# Patient Record
Sex: Female | Born: 1960 | Race: White | Hispanic: No | Marital: Single | Smoking: Never smoker
Health system: Southern US, Community
[De-identification: ages and names within clinical notes are randomized; demographics above are authoritative.]

## PROBLEM LIST (undated history)

## (undated) DIAGNOSIS — I350 Nonrheumatic aortic (valve) stenosis: Secondary | ICD-10-CM

## (undated) DIAGNOSIS — B029 Zoster without complications: Secondary | ICD-10-CM

## (undated) DIAGNOSIS — K589 Irritable bowel syndrome without diarrhea: Secondary | ICD-10-CM

## (undated) DIAGNOSIS — M8580 Other specified disorders of bone density and structure, unspecified site: Secondary | ICD-10-CM

## (undated) DIAGNOSIS — F419 Anxiety disorder, unspecified: Secondary | ICD-10-CM

## (undated) DIAGNOSIS — M940 Chondrocostal junction syndrome [Tietze]: Secondary | ICD-10-CM

## (undated) DIAGNOSIS — D649 Anemia, unspecified: Secondary | ICD-10-CM

## (undated) DIAGNOSIS — Z7901 Long term (current) use of anticoagulants: Secondary | ICD-10-CM

## (undated) DIAGNOSIS — N766 Ulceration of vulva: Secondary | ICD-10-CM

## (undated) DIAGNOSIS — R0602 Shortness of breath: Secondary | ICD-10-CM

## (undated) DIAGNOSIS — K219 Gastro-esophageal reflux disease without esophagitis: Secondary | ICD-10-CM

## (undated) DIAGNOSIS — R21 Rash and other nonspecific skin eruption: Secondary | ICD-10-CM

## (undated) DIAGNOSIS — Z952 Presence of prosthetic heart valve: Secondary | ICD-10-CM

## (undated) DIAGNOSIS — E785 Hyperlipidemia, unspecified: Secondary | ICD-10-CM

## (undated) DIAGNOSIS — K5790 Diverticulosis of intestine, part unspecified, without perforation or abscess without bleeding: Secondary | ICD-10-CM

## (undated) DIAGNOSIS — M810 Age-related osteoporosis without current pathological fracture: Secondary | ICD-10-CM

## (undated) DIAGNOSIS — N3281 Overactive bladder: Secondary | ICD-10-CM

## (undated) DIAGNOSIS — F32A Depression, unspecified: Secondary | ICD-10-CM

## (undated) DIAGNOSIS — K635 Polyp of colon: Secondary | ICD-10-CM

## (undated) DIAGNOSIS — R079 Chest pain, unspecified: Secondary | ICD-10-CM

## (undated) DIAGNOSIS — H409 Unspecified glaucoma: Secondary | ICD-10-CM

## (undated) DIAGNOSIS — G43909 Migraine, unspecified, not intractable, without status migrainosus: Secondary | ICD-10-CM

## (undated) HISTORY — DX: Diverticulosis of intestine, part unspecified, without perforation or abscess without bleeding: K57.90

## (undated) HISTORY — DX: Migraine, unspecified, not intractable, without status migrainosus: G43.909

## (undated) HISTORY — DX: Long term (current) use of anticoagulants: Z79.01

## (undated) HISTORY — DX: Age-related osteoporosis without current pathological fracture: M81.0

## (undated) HISTORY — DX: Unspecified glaucoma: H40.9

## (undated) HISTORY — DX: Anemia, unspecified: D64.9

## (undated) HISTORY — DX: Presence of prosthetic heart valve: Z95.2

## (undated) HISTORY — DX: Chest pain, unspecified: R07.9

## (undated) HISTORY — DX: Nonrheumatic aortic (valve) stenosis: I35.0

## (undated) HISTORY — PX: APPENDECTOMY: SHX54

## (undated) HISTORY — DX: Hyperlipidemia, unspecified: E78.5

## (undated) HISTORY — DX: Polyp of colon: K63.5

## (undated) HISTORY — DX: Other specified disorders of bone density and structure, unspecified site: M85.80

## (undated) HISTORY — PX: FINGER FRACTURE SURGERY: SHX638

## (undated) HISTORY — DX: Shortness of breath: R06.02

## (undated) HISTORY — DX: Depression, unspecified: F32.A

## (undated) HISTORY — DX: Ulceration of vulva: N76.6

## (undated) HISTORY — DX: Anxiety disorder, unspecified: F41.9

## (undated) HISTORY — DX: Gastro-esophageal reflux disease without esophagitis: K21.9

## (undated) HISTORY — DX: Overactive bladder: N32.81

## (undated) HISTORY — DX: Rash and other nonspecific skin eruption: R21

## (undated) HISTORY — DX: Irritable bowel syndrome, unspecified: K58.9

## (undated) HISTORY — DX: Zoster without complications: B02.9

## (undated) HISTORY — DX: Chondrocostal junction syndrome (tietze): M94.0

---

## 1998-11-10 ENCOUNTER — Encounter (INDEPENDENT_AMBULATORY_CARE_PROVIDER_SITE_OTHER): Payer: Self-pay | Admitting: Specialist

## 1998-11-10 ENCOUNTER — Other Ambulatory Visit: Admission: RE | Admit: 1998-11-10 | Discharge: 1998-11-10 | Payer: Self-pay | Admitting: Obstetrics and Gynecology

## 1999-05-18 HISTORY — PX: AORTIC VALVE REPLACEMENT: SHX41

## 1999-06-11 ENCOUNTER — Ambulatory Visit (HOSPITAL_COMMUNITY): Admission: RE | Admit: 1999-06-11 | Discharge: 1999-06-11 | Payer: Self-pay | Admitting: *Deleted

## 1999-08-06 ENCOUNTER — Encounter: Payer: Self-pay | Admitting: Cardiothoracic Surgery

## 1999-08-10 ENCOUNTER — Inpatient Hospital Stay (HOSPITAL_COMMUNITY): Admission: RE | Admit: 1999-08-10 | Discharge: 1999-08-15 | Payer: Self-pay | Admitting: Cardiothoracic Surgery

## 1999-08-10 ENCOUNTER — Encounter: Payer: Self-pay | Admitting: Cardiothoracic Surgery

## 1999-08-10 ENCOUNTER — Encounter (INDEPENDENT_AMBULATORY_CARE_PROVIDER_SITE_OTHER): Payer: Self-pay | Admitting: *Deleted

## 1999-08-11 ENCOUNTER — Encounter: Payer: Self-pay | Admitting: Cardiothoracic Surgery

## 1999-08-12 ENCOUNTER — Encounter: Payer: Self-pay | Admitting: Cardiothoracic Surgery

## 1999-09-08 ENCOUNTER — Encounter (HOSPITAL_COMMUNITY): Admission: RE | Admit: 1999-09-08 | Discharge: 1999-12-07 | Payer: Self-pay | Admitting: *Deleted

## 1999-12-08 ENCOUNTER — Encounter (HOSPITAL_COMMUNITY): Admission: RE | Admit: 1999-12-08 | Discharge: 2000-03-07 | Payer: Self-pay | Admitting: *Deleted

## 2000-02-16 ENCOUNTER — Encounter: Admission: RE | Admit: 2000-02-16 | Discharge: 2000-02-16 | Payer: Self-pay | Admitting: *Deleted

## 2000-02-16 ENCOUNTER — Encounter: Payer: Self-pay | Admitting: *Deleted

## 2000-02-22 ENCOUNTER — Emergency Department (HOSPITAL_COMMUNITY): Admission: EM | Admit: 2000-02-22 | Discharge: 2000-02-22 | Payer: Self-pay | Admitting: Emergency Medicine

## 2000-02-23 ENCOUNTER — Encounter: Payer: Self-pay | Admitting: Emergency Medicine

## 2000-03-21 ENCOUNTER — Encounter: Payer: Self-pay | Admitting: Obstetrics and Gynecology

## 2000-03-21 ENCOUNTER — Encounter: Admission: RE | Admit: 2000-03-21 | Discharge: 2000-03-21 | Payer: Self-pay | Admitting: Obstetrics and Gynecology

## 2001-02-09 ENCOUNTER — Encounter: Payer: Self-pay | Admitting: Family Medicine

## 2001-02-09 ENCOUNTER — Encounter: Admission: RE | Admit: 2001-02-09 | Discharge: 2001-02-09 | Payer: Self-pay | Admitting: Family Medicine

## 2001-07-17 ENCOUNTER — Other Ambulatory Visit: Admission: RE | Admit: 2001-07-17 | Discharge: 2001-07-17 | Payer: Self-pay | Admitting: Obstetrics and Gynecology

## 2002-07-19 ENCOUNTER — Other Ambulatory Visit: Admission: RE | Admit: 2002-07-19 | Discharge: 2002-07-19 | Payer: Self-pay | Admitting: Obstetrics and Gynecology

## 2003-01-17 ENCOUNTER — Encounter: Admission: RE | Admit: 2003-01-17 | Discharge: 2003-01-17 | Payer: Self-pay | Admitting: Family Medicine

## 2003-01-17 ENCOUNTER — Encounter: Payer: Self-pay | Admitting: Family Medicine

## 2003-08-06 ENCOUNTER — Other Ambulatory Visit: Admission: RE | Admit: 2003-08-06 | Discharge: 2003-08-06 | Payer: Self-pay | Admitting: Obstetrics and Gynecology

## 2004-03-17 ENCOUNTER — Ambulatory Visit: Payer: Self-pay

## 2004-04-21 ENCOUNTER — Ambulatory Visit: Payer: Self-pay | Admitting: *Deleted

## 2004-04-28 ENCOUNTER — Ambulatory Visit: Payer: Self-pay | Admitting: Cardiology

## 2004-05-08 ENCOUNTER — Ambulatory Visit: Payer: Self-pay | Admitting: Cardiology

## 2004-05-29 ENCOUNTER — Ambulatory Visit: Payer: Self-pay | Admitting: Cardiovascular Disease

## 2004-06-16 ENCOUNTER — Ambulatory Visit: Payer: Self-pay | Admitting: Internal Medicine

## 2004-07-01 ENCOUNTER — Ambulatory Visit: Payer: Self-pay | Admitting: Cardiology

## 2004-07-15 ENCOUNTER — Ambulatory Visit: Payer: Self-pay | Admitting: Cardiology

## 2004-07-30 ENCOUNTER — Ambulatory Visit: Payer: Self-pay | Admitting: Cardiology

## 2004-08-05 ENCOUNTER — Other Ambulatory Visit: Admission: RE | Admit: 2004-08-05 | Discharge: 2004-08-05 | Payer: Self-pay | Admitting: Obstetrics and Gynecology

## 2004-08-20 ENCOUNTER — Ambulatory Visit: Payer: Self-pay | Admitting: Internal Medicine

## 2004-09-09 ENCOUNTER — Ambulatory Visit: Payer: Self-pay | Admitting: Cardiology

## 2004-09-21 ENCOUNTER — Ambulatory Visit: Payer: Self-pay | Admitting: Cardiology

## 2004-10-01 ENCOUNTER — Ambulatory Visit: Payer: Self-pay | Admitting: Cardiology

## 2004-10-22 ENCOUNTER — Ambulatory Visit: Payer: Self-pay | Admitting: Cardiology

## 2004-11-05 ENCOUNTER — Ambulatory Visit: Payer: Self-pay | Admitting: Cardiology

## 2004-11-27 ENCOUNTER — Ambulatory Visit: Payer: Self-pay | Admitting: Cardiovascular Disease

## 2004-12-21 ENCOUNTER — Ambulatory Visit: Payer: Self-pay | Admitting: Cardiology

## 2005-01-04 ENCOUNTER — Ambulatory Visit: Payer: Self-pay | Admitting: Cardiology

## 2005-01-11 ENCOUNTER — Ambulatory Visit: Payer: Self-pay | Admitting: Cardiology

## 2005-01-25 ENCOUNTER — Ambulatory Visit: Payer: Self-pay | Admitting: Cardiology

## 2005-02-01 ENCOUNTER — Ambulatory Visit: Payer: Self-pay | Admitting: Cardiology

## 2005-02-16 ENCOUNTER — Ambulatory Visit: Payer: Self-pay | Admitting: *Deleted

## 2005-03-16 ENCOUNTER — Ambulatory Visit: Payer: Self-pay | Admitting: Cardiology

## 2005-03-30 ENCOUNTER — Encounter: Payer: Self-pay | Admitting: Emergency Medicine

## 2005-03-31 ENCOUNTER — Inpatient Hospital Stay (HOSPITAL_COMMUNITY): Admission: AD | Admit: 2005-03-31 | Discharge: 2005-04-04 | Payer: Self-pay | Admitting: Internal Medicine

## 2005-04-02 ENCOUNTER — Ambulatory Visit: Payer: Self-pay | Admitting: Internal Medicine

## 2005-04-05 ENCOUNTER — Ambulatory Visit: Payer: Self-pay | Admitting: Cardiology

## 2005-04-07 ENCOUNTER — Ambulatory Visit: Payer: Self-pay | Admitting: Cardiology

## 2005-04-07 ENCOUNTER — Ambulatory Visit: Payer: Self-pay | Admitting: Cardiovascular Disease

## 2005-04-14 ENCOUNTER — Ambulatory Visit: Payer: Self-pay | Admitting: Cardiology

## 2005-04-23 ENCOUNTER — Ambulatory Visit: Payer: Self-pay | Admitting: Cardiology

## 2005-05-06 ENCOUNTER — Ambulatory Visit: Payer: Self-pay | Admitting: Cardiology

## 2005-05-27 ENCOUNTER — Ambulatory Visit: Payer: Self-pay | Admitting: Cardiology

## 2005-06-21 ENCOUNTER — Ambulatory Visit: Payer: Self-pay | Admitting: Internal Medicine

## 2005-07-13 ENCOUNTER — Ambulatory Visit: Payer: Self-pay | Admitting: Cardiovascular Disease

## 2005-08-03 ENCOUNTER — Ambulatory Visit: Payer: Self-pay | Admitting: Internal Medicine

## 2005-08-12 ENCOUNTER — Other Ambulatory Visit: Admission: RE | Admit: 2005-08-12 | Discharge: 2005-08-12 | Payer: Self-pay | Admitting: Obstetrics & Gynecology

## 2005-08-19 ENCOUNTER — Ambulatory Visit: Payer: Self-pay | Admitting: Cardiology

## 2005-09-01 ENCOUNTER — Encounter: Admission: RE | Admit: 2005-09-01 | Discharge: 2005-09-01 | Payer: Self-pay | Admitting: Obstetrics & Gynecology

## 2005-09-09 ENCOUNTER — Ambulatory Visit: Payer: Self-pay | Admitting: *Deleted

## 2005-09-27 ENCOUNTER — Ambulatory Visit: Payer: Self-pay | Admitting: Cardiovascular Disease

## 2005-09-30 ENCOUNTER — Ambulatory Visit: Payer: Self-pay | Admitting: Cardiology

## 2005-10-28 ENCOUNTER — Ambulatory Visit: Payer: Self-pay | Admitting: Cardiology

## 2005-11-12 ENCOUNTER — Ambulatory Visit: Payer: Self-pay | Admitting: Cardiology

## 2005-11-16 ENCOUNTER — Inpatient Hospital Stay (HOSPITAL_COMMUNITY): Admission: EM | Admit: 2005-11-16 | Discharge: 2005-11-20 | Payer: Self-pay | Admitting: Emergency Medicine

## 2005-11-16 ENCOUNTER — Ambulatory Visit: Payer: Self-pay | Admitting: Cardiology

## 2005-11-17 ENCOUNTER — Encounter (INDEPENDENT_AMBULATORY_CARE_PROVIDER_SITE_OTHER): Payer: Self-pay | Admitting: Specialist

## 2005-11-24 ENCOUNTER — Ambulatory Visit: Payer: Self-pay | Admitting: Internal Medicine

## 2005-11-29 ENCOUNTER — Ambulatory Visit: Payer: Self-pay | Admitting: Cardiology

## 2005-12-09 ENCOUNTER — Ambulatory Visit: Payer: Self-pay | Admitting: Internal Medicine

## 2005-12-30 ENCOUNTER — Ambulatory Visit: Payer: Self-pay | Admitting: Cardiology

## 2006-01-20 ENCOUNTER — Ambulatory Visit: Payer: Self-pay | Admitting: Cardiology

## 2006-02-17 ENCOUNTER — Ambulatory Visit: Payer: Self-pay | Admitting: Cardiology

## 2006-03-17 ENCOUNTER — Ambulatory Visit: Payer: Self-pay | Admitting: Cardiology

## 2006-03-22 ENCOUNTER — Ambulatory Visit: Payer: Self-pay | Admitting: Cardiovascular Disease

## 2006-04-05 ENCOUNTER — Ambulatory Visit: Payer: Self-pay | Admitting: Internal Medicine

## 2006-04-05 ENCOUNTER — Ambulatory Visit: Payer: Self-pay

## 2006-04-05 ENCOUNTER — Encounter: Payer: Self-pay | Admitting: Internal Medicine

## 2006-04-11 ENCOUNTER — Ambulatory Visit: Payer: Self-pay | Admitting: Family Medicine

## 2006-04-11 LAB — CONVERTED CEMR LAB
AST: 32 units/L (ref 0–37)
Albumin: 4.4 g/dL (ref 3.5–5.2)
Alkaline Phosphatase: 82 units/L (ref 39–117)
Basophils Absolute: 0 10*3/uL (ref 0.0–0.1)
Calcium: 9.4 mg/dL (ref 8.4–10.5)
Chloride: 103 meq/L (ref 96–112)
GFR calc non Af Amer: 72 mL/min
Glomerular Filtration Rate, Af Am: 87 mL/min/{1.73_m2}
H Pylori IgG: NEGATIVE
HCT: 41.4 % (ref 36.0–46.0)
Hemoglobin: 14.4 g/dL (ref 12.0–15.0)
Lymphocytes Relative: 34.1 % (ref 12.0–46.0)
MCHC: 34.7 g/dL (ref 30.0–36.0)
Monocytes Relative: 10.3 % (ref 3.0–11.0)
Neutro Abs: 3.3 10*3/uL (ref 1.4–7.7)
Platelets: 191 10*3/uL (ref 150–400)
RDW: 12.3 % (ref 11.5–14.6)
Total Bilirubin: 1.6 mg/dL — ABNORMAL HIGH (ref 0.3–1.2)
WBC: 6.2 10*3/uL (ref 4.5–10.5)

## 2006-04-12 ENCOUNTER — Ambulatory Visit: Payer: Self-pay

## 2006-04-16 ENCOUNTER — Encounter: Admission: RE | Admit: 2006-04-16 | Discharge: 2006-04-16 | Payer: Self-pay | Admitting: Family Medicine

## 2006-04-21 ENCOUNTER — Ambulatory Visit: Payer: Self-pay | Admitting: Cardiology

## 2006-05-02 ENCOUNTER — Ambulatory Visit: Payer: Self-pay | Admitting: Family Medicine

## 2006-05-12 ENCOUNTER — Ambulatory Visit: Payer: Self-pay | Admitting: Cardiology

## 2006-05-26 ENCOUNTER — Ambulatory Visit: Payer: Self-pay | Admitting: Cardiology

## 2006-06-09 ENCOUNTER — Ambulatory Visit: Payer: Self-pay | Admitting: Cardiology

## 2006-06-23 ENCOUNTER — Ambulatory Visit: Payer: Self-pay | Admitting: Cardiology

## 2006-07-13 ENCOUNTER — Ambulatory Visit: Payer: Self-pay | Admitting: Internal Medicine

## 2006-07-26 ENCOUNTER — Ambulatory Visit: Payer: Self-pay | Admitting: Cardiology

## 2006-08-03 ENCOUNTER — Ambulatory Visit: Payer: Self-pay | Admitting: Cardiology

## 2006-08-22 ENCOUNTER — Ambulatory Visit: Payer: Self-pay | Admitting: Cardiology

## 2006-09-05 ENCOUNTER — Ambulatory Visit: Payer: Self-pay | Admitting: Cardiovascular Disease

## 2006-09-08 ENCOUNTER — Other Ambulatory Visit: Admission: RE | Admit: 2006-09-08 | Discharge: 2006-09-08 | Payer: Self-pay | Admitting: Obstetrics & Gynecology

## 2006-09-12 ENCOUNTER — Encounter: Admission: RE | Admit: 2006-09-12 | Discharge: 2006-09-12 | Payer: Self-pay | Admitting: Obstetrics & Gynecology

## 2006-09-15 HISTORY — PX: BREAST BIOPSY: SHX20

## 2006-09-27 ENCOUNTER — Encounter (INDEPENDENT_AMBULATORY_CARE_PROVIDER_SITE_OTHER): Payer: Self-pay | Admitting: Specialist

## 2006-09-27 ENCOUNTER — Encounter: Admission: RE | Admit: 2006-09-27 | Discharge: 2006-09-27 | Payer: Self-pay | Admitting: Obstetrics & Gynecology

## 2006-10-03 ENCOUNTER — Ambulatory Visit: Payer: Self-pay

## 2006-10-03 ENCOUNTER — Ambulatory Visit: Payer: Self-pay | Admitting: Cardiovascular Disease

## 2006-10-03 ENCOUNTER — Ambulatory Visit: Payer: Self-pay | Admitting: Cardiology

## 2006-11-01 ENCOUNTER — Ambulatory Visit: Payer: Self-pay | Admitting: Cardiology

## 2006-11-28 ENCOUNTER — Ambulatory Visit: Payer: Self-pay | Admitting: Cardiovascular Disease

## 2006-12-19 ENCOUNTER — Ambulatory Visit: Payer: Self-pay | Admitting: Internal Medicine

## 2007-01-12 ENCOUNTER — Ambulatory Visit: Payer: Self-pay | Admitting: Cardiology

## 2007-02-13 ENCOUNTER — Ambulatory Visit: Payer: Self-pay | Admitting: Cardiovascular Disease

## 2007-03-13 ENCOUNTER — Ambulatory Visit: Payer: Self-pay | Admitting: Cardiology

## 2007-04-10 ENCOUNTER — Ambulatory Visit: Payer: Self-pay | Admitting: Cardiology

## 2007-04-25 ENCOUNTER — Ambulatory Visit: Payer: Self-pay | Admitting: Family Medicine

## 2007-05-01 ENCOUNTER — Ambulatory Visit: Payer: Self-pay | Admitting: Internal Medicine

## 2007-05-01 ENCOUNTER — Ambulatory Visit: Payer: Self-pay | Admitting: Cardiovascular Disease

## 2007-05-01 DIAGNOSIS — M940 Chondrocostal junction syndrome [Tietze]: Secondary | ICD-10-CM | POA: Insufficient documentation

## 2007-05-02 IMAGING — CR DG CHEST 2V
2 series · 2 of 2 positions shown · non-contrast
Comparison: 11/19/05.
 CHEST - 2 VIEW:

CLINICAL DATA: Appendicitis, respiratory distress.

[w chest pa]
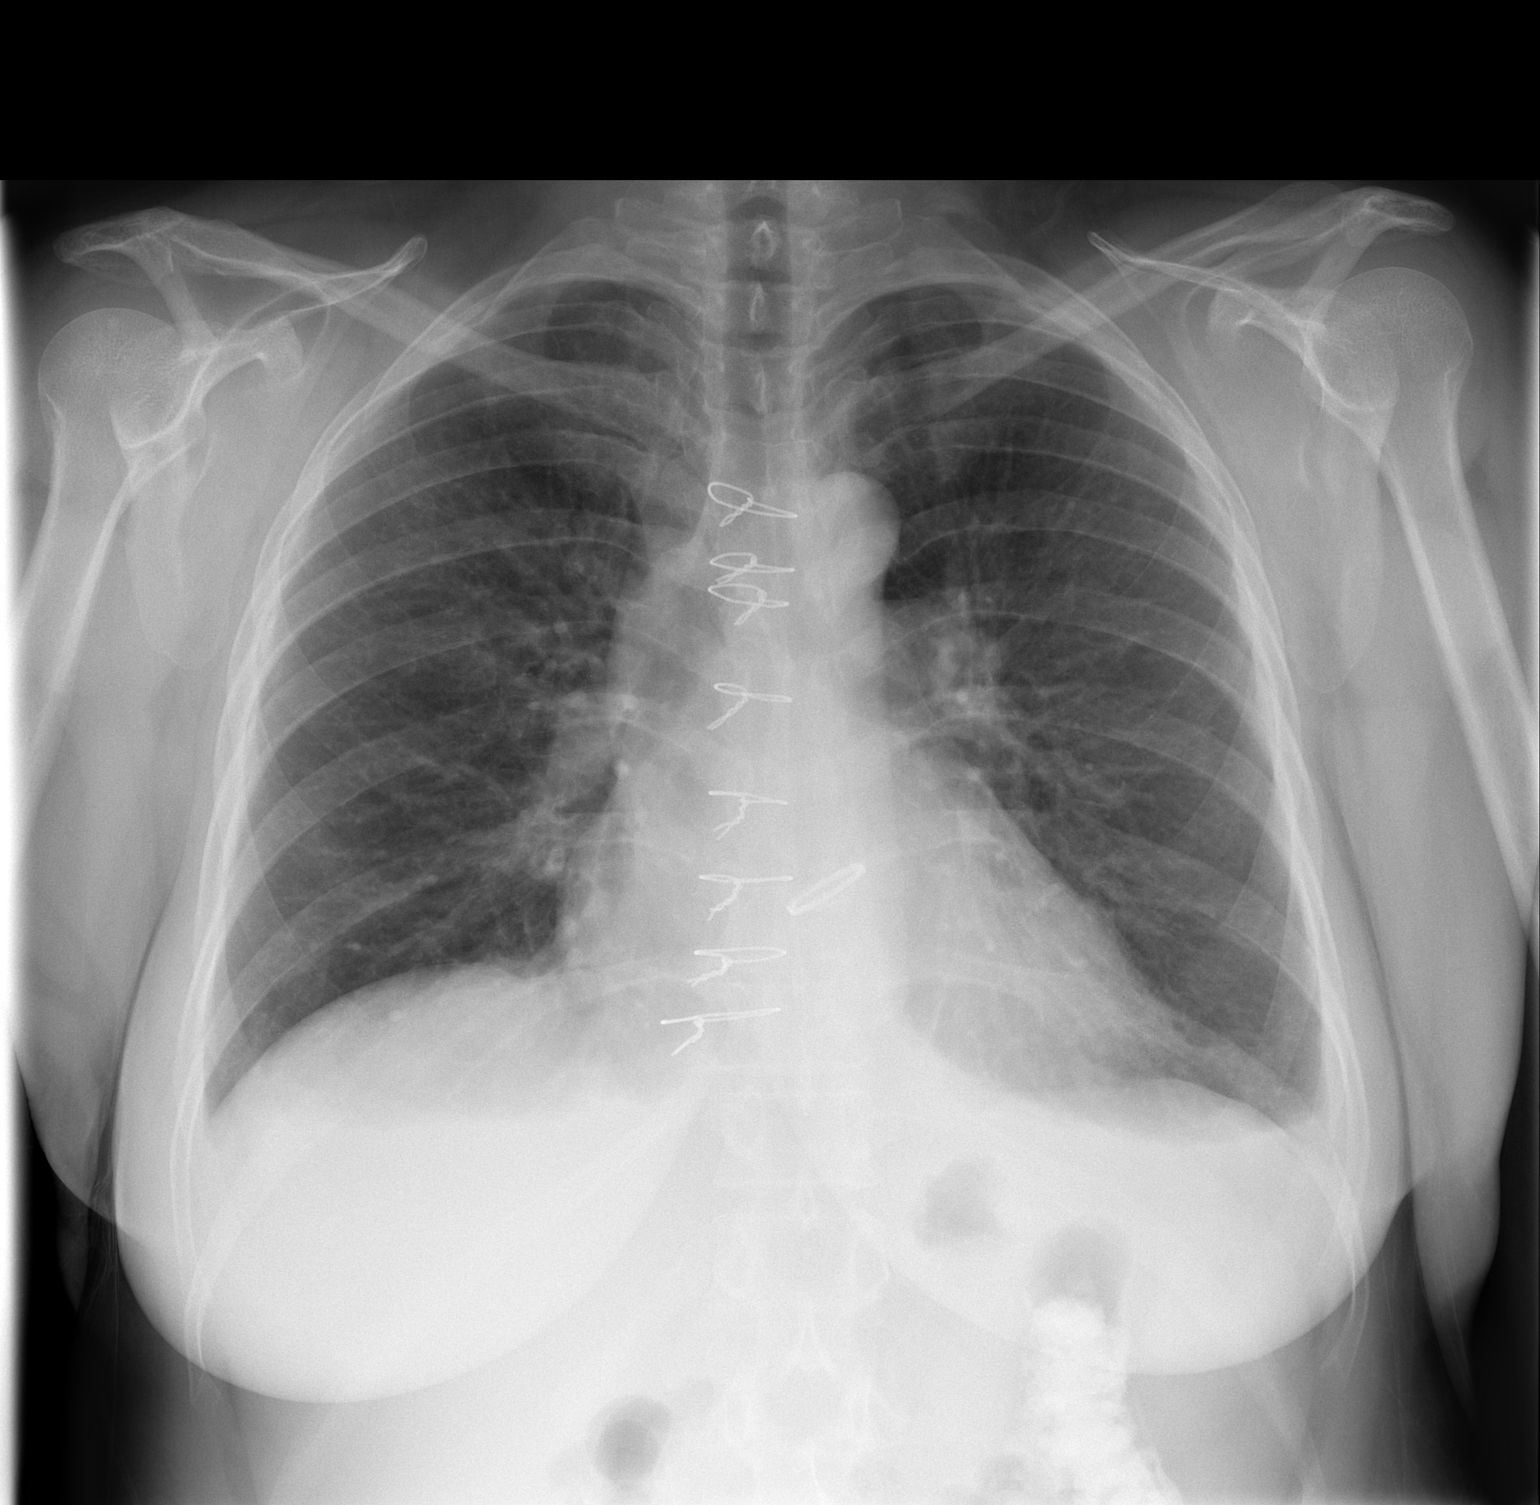

[w chest lat]
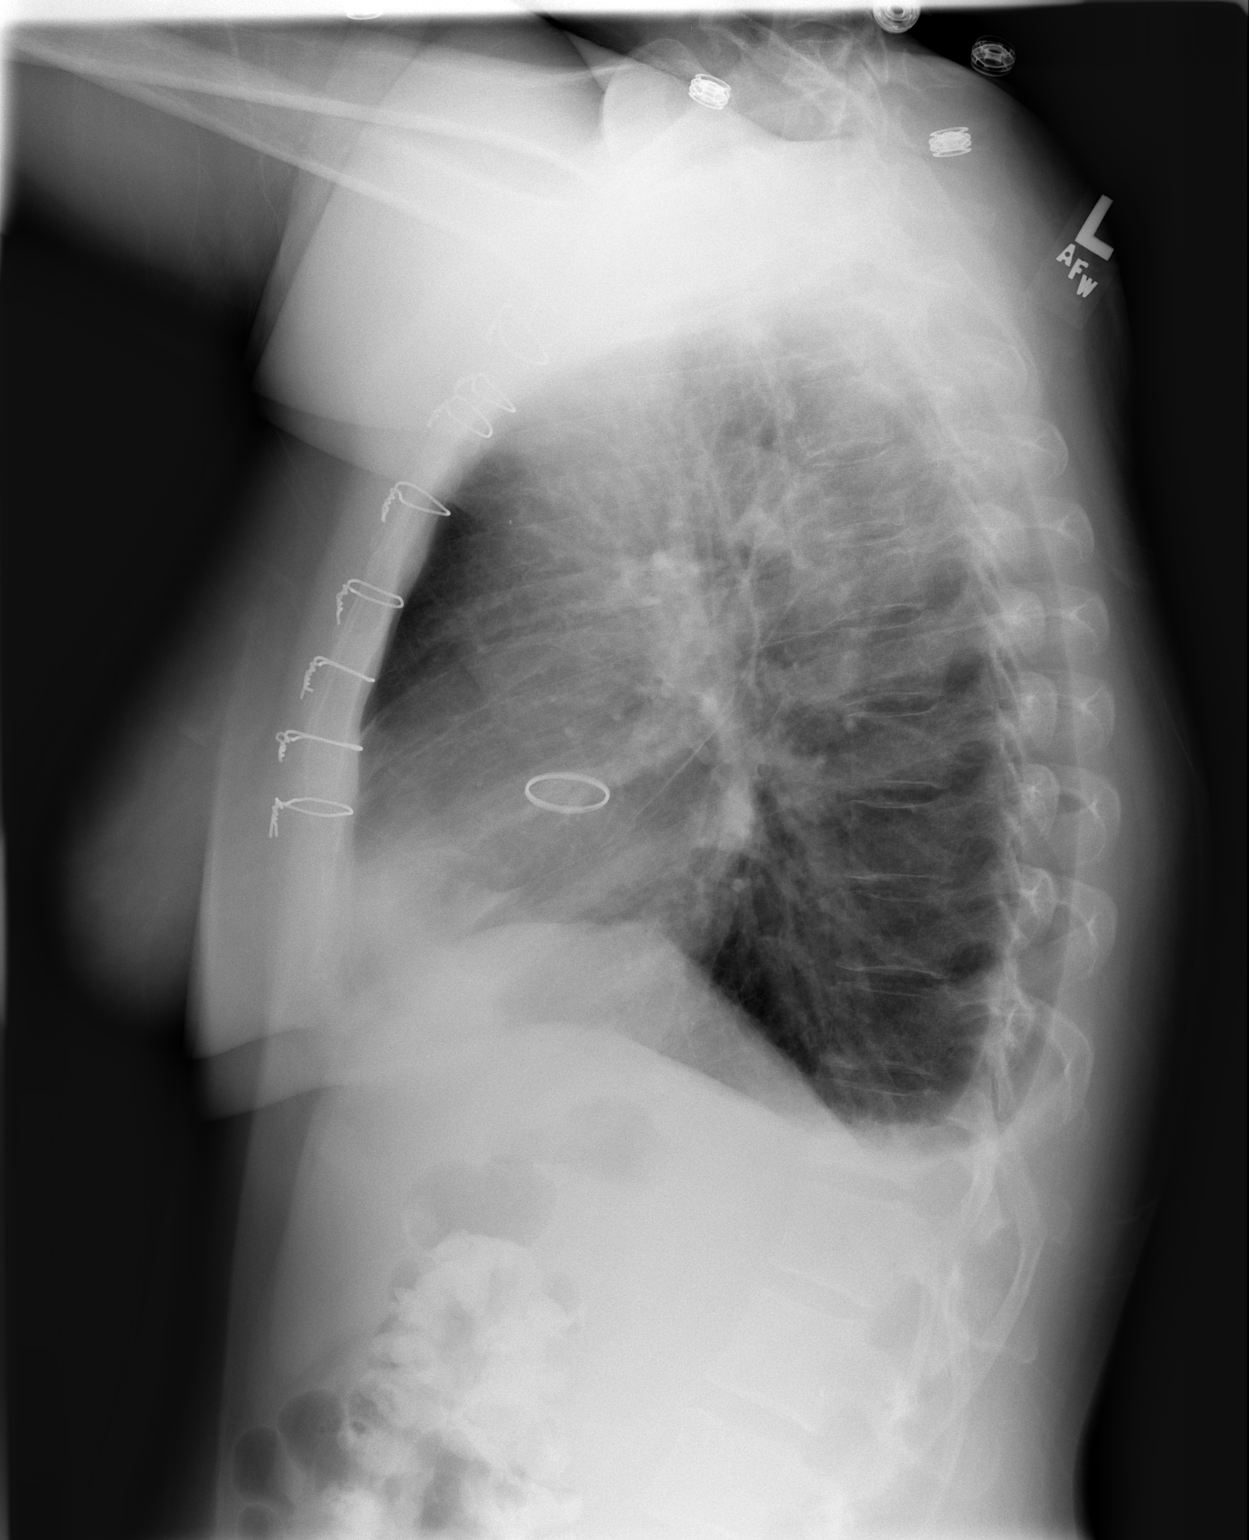

[2 of 2 positions shown; findings below may reference images not displayed]

FINDINGS: There has been significant improvement in pulmonary edema.  Mild left basilar edema persists.  Tiny pleural effusions are present.  The heart is normal in size.  Aortic valve replacement hardware is stable.  No pneumothoraces are seen.
IMPRESSION: Marked improvement in pulmonary edema.  Mild edema persists.

## 2007-05-29 ENCOUNTER — Ambulatory Visit: Payer: Self-pay | Admitting: Cardiovascular Disease

## 2007-06-26 ENCOUNTER — Ambulatory Visit: Payer: Self-pay | Admitting: Cardiology

## 2007-07-25 ENCOUNTER — Ambulatory Visit: Payer: Self-pay | Admitting: Cardiovascular Disease

## 2007-08-15 ENCOUNTER — Ambulatory Visit: Payer: Self-pay | Admitting: Cardiovascular Disease

## 2007-08-29 ENCOUNTER — Ambulatory Visit: Payer: Self-pay | Admitting: Cardiology

## 2007-09-14 ENCOUNTER — Other Ambulatory Visit: Admission: RE | Admit: 2007-09-14 | Discharge: 2007-09-14 | Payer: Self-pay | Admitting: Obstetrics & Gynecology

## 2007-09-26 ENCOUNTER — Ambulatory Visit: Payer: Self-pay | Admitting: Cardiovascular Disease

## 2007-10-24 ENCOUNTER — Ambulatory Visit: Payer: Self-pay | Admitting: Cardiovascular Disease

## 2007-11-21 ENCOUNTER — Ambulatory Visit: Payer: Self-pay | Admitting: Cardiology

## 2007-12-12 ENCOUNTER — Ambulatory Visit: Payer: Self-pay | Admitting: Cardiology

## 2008-01-11 ENCOUNTER — Ambulatory Visit: Payer: Self-pay | Admitting: Cardiovascular Disease

## 2008-01-17 ENCOUNTER — Encounter: Admission: RE | Admit: 2008-01-17 | Discharge: 2008-01-17 | Payer: Self-pay | Admitting: Obstetrics & Gynecology

## 2008-02-08 ENCOUNTER — Ambulatory Visit: Payer: Self-pay | Admitting: Cardiology

## 2008-03-08 ENCOUNTER — Ambulatory Visit: Payer: Self-pay | Admitting: Cardiology

## 2008-03-15 ENCOUNTER — Ambulatory Visit: Payer: Self-pay | Admitting: Internal Medicine

## 2008-03-26 ENCOUNTER — Ambulatory Visit: Payer: Self-pay | Admitting: Cardiology

## 2008-03-31 DIAGNOSIS — I359 Nonrheumatic aortic valve disorder, unspecified: Secondary | ICD-10-CM | POA: Insufficient documentation

## 2008-03-31 DIAGNOSIS — Z952 Presence of prosthetic heart valve: Secondary | ICD-10-CM | POA: Insufficient documentation

## 2008-04-16 ENCOUNTER — Ambulatory Visit: Payer: Self-pay | Admitting: Cardiology

## 2008-05-07 ENCOUNTER — Ambulatory Visit: Payer: Self-pay | Admitting: Cardiovascular Disease

## 2008-05-22 ENCOUNTER — Ambulatory Visit: Payer: Self-pay | Admitting: Cardiology

## 2008-06-17 ENCOUNTER — Ambulatory Visit: Payer: Self-pay | Admitting: Cardiology

## 2008-06-26 ENCOUNTER — Ambulatory Visit: Payer: Self-pay | Admitting: Cardiology

## 2008-07-12 ENCOUNTER — Ambulatory Visit: Payer: Self-pay | Admitting: Internal Medicine

## 2008-08-01 ENCOUNTER — Ambulatory Visit: Payer: Self-pay | Admitting: Internal Medicine

## 2008-08-30 ENCOUNTER — Ambulatory Visit: Payer: Self-pay | Admitting: Cardiology

## 2008-09-19 ENCOUNTER — Ambulatory Visit: Payer: Self-pay | Admitting: Internal Medicine

## 2008-09-26 ENCOUNTER — Ambulatory Visit: Payer: Self-pay | Admitting: Cardiovascular Disease

## 2008-10-03 ENCOUNTER — Encounter (INDEPENDENT_AMBULATORY_CARE_PROVIDER_SITE_OTHER): Payer: Self-pay | Admitting: *Deleted

## 2008-10-03 ENCOUNTER — Ambulatory Visit: Payer: Self-pay | Admitting: Internal Medicine

## 2008-10-03 ENCOUNTER — Telehealth (INDEPENDENT_AMBULATORY_CARE_PROVIDER_SITE_OTHER): Payer: Self-pay | Admitting: Cardiology

## 2008-10-03 ENCOUNTER — Ambulatory Visit: Payer: Self-pay | Admitting: Cardiovascular Disease

## 2008-10-03 DIAGNOSIS — R0602 Shortness of breath: Secondary | ICD-10-CM | POA: Insufficient documentation

## 2008-10-10 ENCOUNTER — Ambulatory Visit: Payer: Self-pay | Admitting: Cardiovascular Disease

## 2008-10-10 LAB — CONVERTED CEMR LAB
POC INR: 2.1
Protime: 18

## 2008-10-15 ENCOUNTER — Encounter: Payer: Self-pay | Admitting: *Deleted

## 2008-10-17 ENCOUNTER — Ambulatory Visit: Payer: Self-pay

## 2008-10-17 ENCOUNTER — Ambulatory Visit: Payer: Self-pay | Admitting: Cardiology

## 2008-10-17 ENCOUNTER — Encounter: Payer: Self-pay | Admitting: Cardiovascular Disease

## 2008-10-17 LAB — CONVERTED CEMR LAB: POC INR: 3.3

## 2008-10-24 ENCOUNTER — Telehealth: Payer: Self-pay | Admitting: Cardiovascular Disease

## 2008-11-14 ENCOUNTER — Ambulatory Visit: Payer: Self-pay | Admitting: Cardiology

## 2008-11-14 LAB — CONVERTED CEMR LAB: POC INR: 4.8

## 2008-11-20 ENCOUNTER — Encounter: Payer: Self-pay | Admitting: *Deleted

## 2008-12-05 ENCOUNTER — Ambulatory Visit: Payer: Self-pay | Admitting: Cardiovascular Disease

## 2008-12-05 LAB — CONVERTED CEMR LAB
POC INR: 3.4
Prothrombin Time: 22.3 s

## 2009-01-02 ENCOUNTER — Ambulatory Visit: Payer: Self-pay | Admitting: Cardiology

## 2009-02-06 ENCOUNTER — Ambulatory Visit: Payer: Self-pay | Admitting: Cardiology

## 2009-02-06 LAB — CONVERTED CEMR LAB: POC INR: 4.4

## 2009-02-27 ENCOUNTER — Ambulatory Visit: Payer: Self-pay | Admitting: Cardiovascular Disease

## 2009-02-27 LAB — CONVERTED CEMR LAB: POC INR: 3.3

## 2009-03-27 ENCOUNTER — Ambulatory Visit: Payer: Self-pay | Admitting: Cardiovascular Disease

## 2009-04-24 ENCOUNTER — Encounter: Admission: RE | Admit: 2009-04-24 | Discharge: 2009-04-24 | Payer: Self-pay | Admitting: Obstetrics & Gynecology

## 2009-04-29 ENCOUNTER — Ambulatory Visit: Payer: Self-pay | Admitting: Cardiovascular Disease

## 2009-05-28 ENCOUNTER — Telehealth (INDEPENDENT_AMBULATORY_CARE_PROVIDER_SITE_OTHER): Payer: Self-pay | Admitting: *Deleted

## 2009-06-02 ENCOUNTER — Ambulatory Visit: Payer: Self-pay | Admitting: Cardiology

## 2009-06-18 ENCOUNTER — Ambulatory Visit: Payer: Self-pay | Admitting: Cardiovascular Disease

## 2009-06-18 LAB — CONVERTED CEMR LAB: POC INR: 3.9

## 2009-07-03 ENCOUNTER — Ambulatory Visit: Payer: Self-pay | Admitting: Cardiovascular Disease

## 2009-07-24 ENCOUNTER — Ambulatory Visit: Payer: Self-pay | Admitting: Cardiovascular Disease

## 2009-07-30 ENCOUNTER — Encounter (INDEPENDENT_AMBULATORY_CARE_PROVIDER_SITE_OTHER): Payer: Self-pay | Admitting: *Deleted

## 2009-08-18 ENCOUNTER — Ambulatory Visit: Payer: Self-pay | Admitting: Internal Medicine

## 2009-08-18 LAB — CONVERTED CEMR LAB: POC INR: 2.7

## 2009-09-16 ENCOUNTER — Ambulatory Visit: Payer: Self-pay | Admitting: Cardiology

## 2009-10-06 ENCOUNTER — Ambulatory Visit: Payer: Self-pay | Admitting: Cardiology

## 2009-10-06 ENCOUNTER — Ambulatory Visit: Payer: Self-pay | Admitting: Cardiovascular Disease

## 2009-10-06 LAB — CONVERTED CEMR LAB: POC INR: 2.5

## 2009-10-29 ENCOUNTER — Ambulatory Visit: Payer: Self-pay | Admitting: Internal Medicine

## 2009-10-29 LAB — CONVERTED CEMR LAB: POC INR: 2.7

## 2009-11-25 ENCOUNTER — Ambulatory Visit: Payer: Self-pay | Admitting: Cardiology

## 2009-12-23 ENCOUNTER — Ambulatory Visit: Payer: Self-pay | Admitting: Cardiology

## 2009-12-23 LAB — CONVERTED CEMR LAB: POC INR: 2.7

## 2010-01-15 ENCOUNTER — Ambulatory Visit: Payer: Self-pay | Admitting: Cardiology

## 2010-01-15 LAB — CONVERTED CEMR LAB: POC INR: 2.9

## 2010-02-16 ENCOUNTER — Ambulatory Visit: Payer: Self-pay | Admitting: Internal Medicine

## 2010-02-16 LAB — CONVERTED CEMR LAB: POC INR: 2.9

## 2010-03-20 ENCOUNTER — Ambulatory Visit: Payer: Self-pay | Admitting: Cardiology

## 2010-03-20 LAB — CONVERTED CEMR LAB: POC INR: 2.8

## 2010-04-22 ENCOUNTER — Ambulatory Visit: Payer: Self-pay | Admitting: Cardiology

## 2010-04-22 LAB — CONVERTED CEMR LAB: POC INR: 3

## 2010-05-21 ENCOUNTER — Ambulatory Visit: Admission: RE | Admit: 2010-05-21 | Discharge: 2010-05-21 | Payer: Self-pay | Source: Home / Self Care

## 2010-06-16 NOTE — Medication Information (Signed)
Summary: rov/td  Anticoagulant Therapy  Managed by: Weston Brass, PharmD Referring MD: Charlton Haws MD Supervising MD: Gala Romney MD, Reuel Boom Indication 1: Aortic Valve Disorder (ICD-424.1) Lab Used: [facility] Site: [facility] INR POC 2.7 INR RANGE 2.5 - 3.5  Dietary changes: no    Health status changes: no    Bleeding/hemorrhagic complications: no    Recent/future hospitalizations: no    Any changes in medication regimen? no    Recent/future dental: no  Any missed doses?: no       Is patient compliant with meds? yes       Allergies: 1)  ! Morphine  Anticoagulation Management History:      The patient is taking warfarin and comes in today for a routine follow up visit.  Negative risk factors for bleeding include an age less than 105 years old.  The bleeding index is 'low risk'.  Negative CHADS2 values include Age > 70 years old.  The start date was 08/22/2002.  Her last INR was 6.2 RATIO.  Anticoagulation responsible provider: Brylan Seubert MD, Reuel Boom.  INR POC: 2.7.  Cuvette Lot#: [id number].  Exp: 12/2010.    Anticoagulation Management Assessment/Plan:      The patient's current anticoagulation dose is Coumadin 7.5 mg tabs: Take as directed by coumadin clinic..  The target INR is 2.5-3.5.  The next INR is due 11/25/2009.  Anticoagulation instructions were given to patient.  Results were reviewed/authorized by Weston Brass, PharmD.  She was notified by Weston Brass PharmD.         Prior Anticoagulation Instructions: The patient's dosage of coumadin will be increased.  The new dosage includes:  1 tablet every day, except 1/2 tablet on Sundays  Current Anticoagulation Instructions: INR 2.7  Continue same dose of 1 tablet every day except 1/2 tablet on Sunday

## 2010-06-16 NOTE — Medication Information (Signed)
Summary: rov/sl  Anticoagulant Therapy  Managed by: Weston Brass, PharmD Referring MD: Charlton Haws MD Supervising MD: Juanda Chance MD, Joeanne Robicheaux Indication 1: Aortic Valve Disorder (ICD-424.1) Lab Used: [facility] Site: [facility] INR POC 2.8 INR RANGE 2.5 - 3.5  Dietary changes: no    Health status changes: no    Bleeding/hemorrhagic complications: no    Recent/future hospitalizations: no    Any changes in medication regimen? no    Recent/future dental: no  Any missed doses?: no       Is patient compliant with meds? yes       Allergies (verified): 1)  ! Morphine  Anticoagulation Management History:      The patient is taking warfarin and comes in today for a routine follow up visit.  Negative risk factors for bleeding include an age less than 72 years old.  The bleeding index is 'low risk'.  Negative CHADS2 values include Age > 97 years old.  The start date was 08/22/2002.  Her last INR was 6.2 RATIO.  Anticoagulation responsible provider: Juanda Chance MD, Smitty Cords.  INR POC: 2.8.  Cuvette Lot#: [id number].  Exp: 03/2011.    Anticoagulation Management Assessment/Plan:      The patient's current anticoagulation dose is Coumadin 7.5 mg tabs: Take as directed by coumadin clinic..  The target INR is 2.5-3.5.  The next INR is due 04/16/2010.  Anticoagulation instructions were given to patient.  Results were reviewed/authorized by Weston Brass, PharmD.  She was notified by Hoy Register, PharmD Candidate.         Prior Anticoagulation Instructions: INR 2.9  Continue taking Coumadin 1 tab (7.5 mg) on all days except  Coumadin 0.5 tab (3.75 mg) on Sundays. Return to clinic in 4 weeks.   Current Anticoagulation Instructions: INR 2.8  Continue previous dose of 1 tablet everyday except 1/2 tablet on Sunday. Recheck INR in 4 weeks.

## 2010-06-16 NOTE — Medication Information (Signed)
Summary: [facility] coumadin - [facility]  Anticoagulant Therapy  Managed by: Bethanne Ginger, PharmD Referring MD: Charlton Haws MD Supervising MD: Jens Som MD, Arlys John Indication 1: Aortic Valve Disorder (ICD-424.1) Lab Used: [facility] Site: [facility] INR POC 2.5 INR RANGE 2.5 - 3.5  Dietary changes: no    Health status changes: no    Bleeding/hemorrhagic complications: no    Recent/future hospitalizations: no    Any changes in medication regimen? no    Recent/future dental: no  Any missed doses?: no       Is patient compliant with meds? yes      Comments: Wanting to increase greens in the diet (esp salads w/ baby spinach) to help loose weight INR low last visit, at 2.5 this visit - will increase dose by 1/2 tablet on Fridays  Current Medications (verified): 1)  Erin Bcp 2)  Caltrate 500mg  .... 1 Tab By Mouth Once Daily 3)  Coumadin 7.5 Mg Tabs (Warfarin Sodium) .... Take As Directed By Coumadin Clinic.  Allergies (verified): 1)  ! Morphine  Anticoagulation Management History:      The patient is taking warfarin and comes in today for a routine follow up visit.  Negative risk factors for bleeding include an age less than 7 years old.  The bleeding index is 'low risk'.  Negative CHADS2 values include Age > 72 years old.  The start date was 08/22/2002.  Her last INR was 6.2 RATIO.  Anticoagulation responsible provider: Jens Som MD, Arlys John.  INR POC: 2.5.  Cuvette Lot#: [id number].  Exp: 12/2010.    Anticoagulation Management Assessment/Plan:      The patient's current anticoagulation dose is Coumadin 7.5 mg tabs: Take as directed by coumadin clinic..  The target INR is 2.5-3.5.  The next INR is due 10/28/2009.  Anticoagulation instructions were given to patient.  Results were reviewed/authorized by Bethanne Ginger, PharmD.  She was notified by Bethanne Ginger.         Prior Anticoagulation Instructions: INR 2  Coumadin 1 and 1/2 tab today Tue 5/3 then 1 tab = 7.5mg  each day except 1/2  tab Sun and Fri  Current Anticoagulation Instructions: The patient's dosage of coumadin will be increased.  The new dosage includes:  1 tablet every day, except 1/2 tablet on Sundays

## 2010-06-16 NOTE — Medication Information (Signed)
Summary: rov/ewj  Anticoagulant Therapy  Managed by: Eda Keys, PharmD Referring MD: Charlton Haws MD Supervising MD: Eden Emms MD, Theron Arista Indication 1: Aortic Valve Disorder (ICD-424.1) Lab Used: [facility] Site: [facility] INR POC 2.9 INR RANGE 2.5 - 3.5  Dietary changes: no    Health status changes: no    Bleeding/hemorrhagic complications: no    Recent/future hospitalizations: no    Any changes in medication regimen? no    Recent/future dental: no  Any missed doses?: no       Is patient compliant with meds? yes       Allergies: 1)  ! Morphine  Anticoagulation Management History:      The patient is taking warfarin and comes in today for a routine follow up visit.  Negative risk factors for bleeding include an age less than 40 years old.  The bleeding index is 'low risk'.  Negative CHADS2 values include Age > 19 years old.  The start date was 08/22/2002.  Her last INR was 6.2 RATIO.  Anticoagulation responsible provider: Eden Emms MD, Theron Arista.  INR POC: 2.9.  Cuvette Lot#: [id number].  Exp: 09/2010.    Anticoagulation Management Assessment/Plan:      The patient's current anticoagulation dose is Coumadin 7.5 mg tabs: Take as directed by coumadin clinic..  The target INR is 2.5-3.5.  The next INR is due 08/18/2009.  Anticoagulation instructions were given to patient.  Results were reviewed/authorized by Eda Keys, PharmD.  She was notified by Eda Keys.         Prior Anticoagulation Instructions: INR 3.7  Take 1/2 tablet today then resume same dosage 1 tablet daily except 1/2 tablet on Sundays and Fridays.  Recheck in 3 weeks.    Current Anticoagulation Instructions: INR 2.9  Continue current dosing schedule of 1 tablet every day, except 1/2 tablet on Sunday and Friday.  Return to clinic in 4 weeks.

## 2010-06-16 NOTE — Medication Information (Addendum)
Summary: rov/nb  Anticoagulant Therapy  Managed by: Leota Sauers, PharmD, BCPS, CPP Referring MD: Charlton Haws MD Supervising MD: Cassell Clement MD. Indication 1: Aortic Valve Disorder (ICD-424.1) Lab Used: [facility] Site: [facility] INR POC 3.0 INR RANGE 2.5 - 3.5  Dietary changes: no    Health status changes: no    Bleeding/hemorrhagic complications: no    Recent/future hospitalizations: no    Any changes in medication regimen? no    Recent/future dental: no  Any missed doses?: no       Is patient compliant with meds? yes       Current Medications (verified): 1)  Erin Bcp 2)  Caltrate 500mg  .... 1 Tab By Mouth Once Daily 3)  Coumadin 7.5 Mg Tabs (Warfarin Sodium) .... Take As Directed By Coumadin Clinic.  Allergies (verified): 1)  ! Morphine  Anticoagulation Management History:      The patient is taking warfarin and comes in today for a routine follow up visit.  Negative risk factors for bleeding include an age less than 81 years old.  The bleeding index is 'low risk'.  Negative CHADS2 values include Age > 4 years old.  The start date was 08/22/2002.  Her last INR was 6.2 RATIO.  Anticoagulation responsible provider: Cassell Clement MD..  INR POC: 3.0.  Cuvette Lot#: [id number].  Exp: 03/2011.    Anticoagulation Management Assessment/Plan:      The patient's current anticoagulation dose is Coumadin 7.5 mg tabs: Take as directed by coumadin clinic..  The target INR is 2.5-3.5.  The next INR is due 05/20/2010.  Anticoagulation instructions were given to patient.  Results were reviewed/authorized by Leota Sauers, PharmD, BCPS, CPP.         Prior Anticoagulation Instructions: INR 2.8  Continue previous dose of 1 tablet everyday except 1/2 tablet on Sunday. Recheck INR in 4 weeks.   Current Anticoagulation Instructions: INR 3.0  Coumadin 7.5mg  tab - take 1 tab each day except 1/2 tab on SUN

## 2010-06-16 NOTE — Medication Information (Signed)
Summary: rov/ewj  Anticoagulant Therapy  Managed by: Shelby Dubin, PharmD, BCPS, CPP Referring MD: Charlton Haws MD Supervising MD: Shirlee Latch MD, Freida Busman Indication 1: Aortic Valve Disorder (ICD-424.1) Lab Used: [facility] Site: [facility] INR POC 1.8 INR RANGE 2.5 - 3.5  Dietary changes: no    Health status changes: no    Bleeding/hemorrhagic complications: no    Recent/future hospitalizations: no    Any changes in medication regimen? yes       Details: Started strerapred 10mg   12 days doses  (took it for 5 days now) and vicodin 5/500  Recent/future dental: no  Any missed doses?: yes     Details: Took 3.75mg  on last wednesday instead of 7.5mg   Is patient compliant with meds? yes       Allergies (verified): 1)  ! Morphine  Anticoagulation Management History:      The patient is taking warfarin and comes in today for a routine follow up visit.  Negative risk factors for bleeding include an age less than 54 years old.  The bleeding index is 'low risk'.  Negative CHADS2 values include Age > 41 years old.  The start date was 08/22/2002.  Her last INR was 6.2 RATIO.  Anticoagulation responsible provider: Shirlee Latch MD, Ildefonso Keaney.  INR POC: 1.8.  Cuvette Lot#: [id number].  Exp: 08/2010.    Anticoagulation Management Assessment/Plan:      The patient's current anticoagulation dose is Coumadin 7.5 mg tabs: Take as directed by coumadin clinic..  The target INR is 2.5-3.5.  The next INR is due 06/18/2009.  Anticoagulation instructions were given to patient.  Results were reviewed/authorized by Shelby Dubin, PharmD, BCPS, CPP.  She was notified by Ysidro Evert, Pharm D Candidate.         Prior Anticoagulation Instructions: INR 2.2  Take 1.5 tablets today then resume same dosage 1 tablet daily except 1/2 tablet on Sundays and Fridays.   Recheck in 3-4 weeks.    Current Anticoagulation Instructions: INR: 1.8 Take extra 1/2 tablet (total of 1.5 tablets) today and tomorrow then resume to same  dosage of 1 tablet daily except 1/2 tablet on Sundays and Fridays Recheck in 2 weeks

## 2010-06-16 NOTE — Medication Information (Signed)
Summary: rov/ez  Anticoagulant Therapy  Managed by: Eda Keys, PharmD Referring MD: Charlton Haws MD Supervising MD: Eden Emms MD, Theron Arista Indication 1: Aortic Valve Disorder (ICD-424.1) Lab Used: [facility] Site: [facility] INR POC 3.9 INR RANGE 2.5 - 3.5  Dietary changes: no    Health status changes: no    Bleeding/hemorrhagic complications: yes       Details: some bleeding in nose  Recent/future hospitalizations: no    Any changes in medication regimen? yes       Details: recently finished round of amoxicillin, and finished prednisone pack about 1 weeks ago  Recent/future dental: no  Any missed doses?: no       Is patient compliant with meds? yes       Current Medications (verified): 1)  Erin Bcp 2)  Caltrate 500mg  .... 1 Tab By Mouth Once Daily 3)  Coumadin 7.5 Mg Tabs (Warfarin Sodium) .... Take As Directed By Coumadin Clinic.  Allergies (verified): 1)  ! Morphine  Anticoagulation Management History:      The patient is taking warfarin and comes in today for a routine follow up visit.  Negative risk factors for bleeding include an age less than 75 years old.  The bleeding index is 'low risk'.  Negative CHADS2 values include Age > 66 years old.  The start date was 08/22/2002.  Her last INR was 6.2 RATIO.  Anticoagulation responsible provider: Eden Emms MD, Theron Arista.  INR POC: 3.9.  Cuvette Lot#: [id number].  Exp: 08/2010.    Anticoagulation Management Assessment/Plan:      The patient's current anticoagulation dose is Coumadin 7.5 mg tabs: Take as directed by coumadin clinic..  The target INR is 2.5-3.5.  The next INR is due 07/02/2009.  Anticoagulation instructions were given to patient.  Results were reviewed/authorized by Eda Keys, PharmD.  She was notified by Eda Keys.         Prior Anticoagulation Instructions: INR: 1.8 Take extra 1/2 tablet (total of 1.5 tablets) today and tomorrow then resume to same dosage of 1 tablet daily except 1/2 tablet on Sundays  and Fridays Recheck in 2 weeks  Current Anticoagulation Instructions: INR 3.9  Take 1/2 tablet today, the return to normal dosing schedule of 1 tablet daily, except 1/2 tablet on Sunday and Friday. Return to clinic in 2 weeks.

## 2010-06-16 NOTE — Medication Information (Signed)
Summary: rov/tm  Anticoagulant Therapy  Managed by: Weston Brass, PharmD Referring MD: Charlton Haws MD Supervising MD: Myrtis Ser MD, Tinnie Gens Indication 1: Aortic Valve Disorder (ICD-424.1) Lab Used: [facility] Site: [facility] INR POC 2.7 INR RANGE 2.5 - 3.5  Dietary changes: no    Health status changes: no    Bleeding/hemorrhagic complications: no    Recent/future hospitalizations: no    Any changes in medication regimen? no    Recent/future dental: no  Any missed doses?: no       Is patient compliant with meds? yes       Allergies: 1)  ! Morphine  Anticoagulation Management History:      The patient is taking warfarin and comes in today for a routine follow up visit.  Negative risk factors for bleeding include an age less than 38 years old.  The bleeding index is 'low risk'.  Negative CHADS2 values include Age > 73 years old.  The start date was 08/22/2002.  Her last INR was 6.2 RATIO.  Anticoagulation responsible Angalina Ante: Myrtis Ser MD, Tinnie Gens.  INR POC: 2.7.  Cuvette Lot#: [id number].  Exp: 02/2011.    Anticoagulation Management Assessment/Plan:      The patient's current anticoagulation dose is Coumadin 7.5 mg tabs: Take as directed by coumadin clinic..  The target INR is 2.5-3.5.  The next INR is due 01/15/2010.  Anticoagulation instructions were given to patient.  Results were reviewed/authorized by Weston Brass, PharmD.  She was notified by Gweneth Fritter.         Prior Anticoagulation Instructions: INR 3.4 Contiue 7.5mg s everyday except on Sundays take 3.75mg s. Recheck in 4 weeks.   Current Anticoagulation Instructions: INR 2.7   Continue taking 1 tablet (7.5mg ) every day except take 1/2 tablet (3.75mg ) on Sunday.  Recheck in 4 weeks.

## 2010-06-16 NOTE — Medication Information (Signed)
Summary: rov coumadin - [facility]  Anticoagulant Therapy  Managed by: Reina Fuse, PharmD Referring MD: Charlton Haws MD Supervising MD: Gala Romney MD, Reuel Boom Indication 1: Aortic Valve Disorder (ICD-424.1) Lab Used: [facility] Site: [facility] INR POC 2.9 INR RANGE 2.5 - 3.5  Dietary changes: no    Health status changes: no    Bleeding/hemorrhagic complications: no    Recent/future hospitalizations: no    Any changes in medication regimen? no    Recent/future dental: no  Any missed doses?: yes     Details: Missed one dose last week and doubled up next day.   Is patient compliant with meds? yes      Comments: Reviewed missed dose instructions.   Current Medications (verified): 1)  Erin Bcp 2)  Caltrate 500mg  .... 1 Tab By Mouth Once Daily 3)  Coumadin 7.5 Mg Tabs (Warfarin Sodium) .... Take As Directed By Coumadin Clinic.  Allergies (verified): 1)  ! Morphine  Anticoagulation Management History:      The patient is taking warfarin and comes in today for a routine follow up visit.  Negative risk factors for bleeding include an age less than 82 years old.  The bleeding index is 'low risk'.  Negative CHADS2 values include Age > 19 years old.  The start date was 08/22/2002.  Her last INR was 6.2 RATIO.  Anticoagulation responsible Hervey Wedig: Bensimhon MD, Reuel Boom.  INR POC: 2.9.  Cuvette Lot#: [id number].  Exp: 02/2011.    Anticoagulation Management Assessment/Plan:      The patient's current anticoagulation dose is Coumadin 7.5 mg tabs: Take as directed by coumadin clinic..  The target INR is 2.5-3.5.  The next INR is due 03/17/2010.  Anticoagulation instructions were given to patient.  Results were reviewed/authorized by Reina Fuse, PharmD.  She was notified by Reina Fuse PharmD.         Prior Anticoagulation Instructions: INR 2.9 countinue coumadin 7.5mg  1 tab each day except 1/2 tab on Sun  Current Anticoagulation Instructions: INR 2.9  Continue taking Coumadin 1 tab (7.5 mg)  on all days except  Coumadin 0.5 tab (3.75 mg) on Sundays. Return to clinic in 4 weeks.

## 2010-06-16 NOTE — Medication Information (Signed)
Summary: rov/eac  Anticoagulant Therapy  Managed by: Weston Brass, PharmD Referring MD: Charlton Haws MD Supervising MD: Clifton James MD, Cristal Deer Indication 1: Aortic Valve Disorder (ICD-424.1) Lab Used: [facility] Site: [facility] INR POC 2.7 INR RANGE 2.5 - 3.5  Dietary changes: no    Health status changes: no    Bleeding/hemorrhagic complications: no    Recent/future hospitalizations: no    Any changes in medication regimen? no    Recent/future dental: no  Any missed doses?: no       Is patient compliant with meds? yes       Allergies: 1)  ! Morphine  Anticoagulation Management History:      The patient is taking warfarin and comes in today for a routine follow up visit.  Negative risk factors for bleeding include an age less than 30 years old.  The bleeding index is 'low risk'.  Negative CHADS2 values include Age > 72 years old.  The start date was 08/22/2002.  Her last INR was 6.2 RATIO.  Anticoagulation responsible provider: Clifton James MD, Cristal Deer.  INR POC: 2.7.  Cuvette Lot#: [id number].  Exp: 09/2010.    Anticoagulation Management Assessment/Plan:      The patient's current anticoagulation dose is Coumadin 7.5 mg tabs: Take as directed by coumadin clinic..  The target INR is 2.5-3.5.  The next INR is due 09/15/2009.  Anticoagulation instructions were given to patient.  Results were reviewed/authorized by Weston Brass, PharmD.  She was notified by Weston Brass PharmD.         Prior Anticoagulation Instructions: INR 2.9  Continue current dosing schedule of 1 tablet every day, except 1/2 tablet on Sunday and Friday.  Return to clinic in 4 weeks.    Current Anticoagulation Instructions: INR 2.7  Continue same dose of 1 tablet every day except 1/2 tablet on Sunday and Friday

## 2010-06-16 NOTE — Medication Information (Signed)
Summary: rov/sp  Anticoagulant Therapy  Managed by: Leota Sauers, PharmD, BCPS, CPP Referring MD: Charlton Haws MD Supervising MD: Shirlee Latch MD, Edd Reppert Indication 1: Aortic Valve Disorder (ICD-424.1) Lab Used: [facility] Site: [facility] INR POC 2.0 INR RANGE 2.5 - 3.5  Dietary changes: no    Health status changes: no    Bleeding/hemorrhagic complications: no    Recent/future hospitalizations: no    Any changes in medication regimen? no    Recent/future dental: no  Any missed doses?: no       Is patient compliant with meds? yes       Current Medications (verified): 1)  Erin Bcp 2)  Caltrate 500mg  .... 1 Tab By Mouth Once Daily 3)  Coumadin 7.5 Mg Tabs (Warfarin Sodium) .... Take As Directed By Coumadin Clinic.  Allergies: 1)  ! Morphine  Anticoagulation Management History:      The patient is taking warfarin and comes in today for a routine follow up visit.  Negative risk factors for bleeding include an age less than 59 years old.  The bleeding index is 'low risk'.  Negative CHADS2 values include Age > 61 years old.  The start date was 08/22/2002.  Her last INR was 6.2 RATIO.  Anticoagulation responsible provider: Shirlee Latch MD, Teauna Dubach.  INR POC: 2.0.  Cuvette Lot#: [id number].  Exp: 09/2010.    Anticoagulation Management Assessment/Plan:      The patient's current anticoagulation dose is Coumadin 7.5 mg tabs: Take as directed by coumadin clinic..  The target INR is 2.5-3.5.  The next INR is due 09/30/2009.  Anticoagulation instructions were given to patient.  Results were reviewed/authorized by Leota Sauers, PharmD, BCPS, CPP.         Prior Anticoagulation Instructions: INR 2.7  Continue same dose of 1 tablet every day except 1/2 tablet on Sunday and Friday   Current Anticoagulation Instructions: INR 2  Coumadin 1 and 1/2 tab today Tue 5/3 then 1 tab = 7.5mg  each day except 1/2 tab Sun and Fri

## 2010-06-16 NOTE — Medication Information (Signed)
Summary: rov/jk  Anticoagulant Therapy  Managed by: Leota Sauers, PharmD, BCPS, CPP Referring MD: Charlton Haws MD Supervising MD: Jens Som MD, Arlys John Indication 1: Aortic Valve Disorder (ICD-424.1) Lab Used: [facility] Site: [facility] INR POC 2.9 INR RANGE 2.5 - 3.5  Dietary changes: no    Health status changes: no    Bleeding/hemorrhagic complications: no    Recent/future hospitalizations: no    Any changes in medication regimen? no    Recent/future dental: no  Any missed doses?: no       Is patient compliant with meds? yes      Comments: added prt smoothies to diet should not interact  Current Medications (verified): 1)  Erin Bcp 2)  Caltrate 500mg  .... 1 Tab By Mouth Once Daily 3)  Coumadin 7.5 Mg Tabs (Warfarin Sodium) .... Take As Directed By Coumadin Clinic.  Allergies (verified): 1)  ! Morphine  Anticoagulation Management History:      The patient is taking warfarin and comes in today for a routine follow up visit.  Negative risk factors for bleeding include an age less than 15 years old.  The bleeding index is 'low risk'.  Negative CHADS2 values include Age > 39 years old.  The start date was 08/22/2002.  Her last INR was 6.2 RATIO.  Anticoagulation responsible provider: Jens Som MD, Arlys John.  INR POC: 2.9.  Cuvette Lot#: [id number].  Exp: 02/2011.    Anticoagulation Management Assessment/Plan:      The patient's current anticoagulation dose is Coumadin 7.5 mg tabs: Take as directed by coumadin clinic..  The target INR is 2.5-3.5.  The next INR is due 02/12/2010.  Anticoagulation instructions were given to patient.  Results were reviewed/authorized by Leota Sauers, PharmD, BCPS, CPP.         Prior Anticoagulation Instructions: INR 2.7   Continue taking 1 tablet (7.5mg ) every day except take 1/2 tablet (3.75mg ) on Sunday.  Recheck in 4 weeks.   Current Anticoagulation Instructions: INR 2.9 countinue coumadin 7.5mg  1 tab each day except 1/2 tab on Sun

## 2010-06-16 NOTE — Medication Information (Signed)
Summary: rov/sp  Anticoagulant Therapy  Managed by: Bethena Midget, RN, BSN Referring MD: Charlton Haws MD Supervising MD: Myrtis Ser MD, Tinnie Gens Indication 1: Aortic Valve Disorder (ICD-424.1) Lab Used: [facility] Site: [facility] INR POC 3.4 INR RANGE 2.5 - 3.5  Dietary changes: no    Health status changes: no    Bleeding/hemorrhagic complications: no    Recent/future hospitalizations: no    Any changes in medication regimen? no    Recent/future dental: no  Any missed doses?: yes     Details: one dose missed on 11/14/09  Is patient compliant with meds? yes      Comments: Pt states she fell 2 weekends ago on her buttocks, did have a large bruise but it is disappearing. She was educated on seeking medical atten for falls due to risk of bleeding.   Allergies: 1)  ! Morphine  Anticoagulation Management History:      The patient is taking warfarin and comes in today for a routine follow up visit.  Negative risk factors for bleeding include an age less than 53 years old.  The bleeding index is 'low risk'.  Negative CHADS2 values include Age > 11 years old.  The start date was 08/22/2002.  Her last INR was 6.2 RATIO.  Anticoagulation responsible provider: Myrtis Ser MD, Tinnie Gens.  INR POC: 3.4.  Cuvette Lot#: [id number].  Exp: 01/2011.    Anticoagulation Management Assessment/Plan:      The patient's current anticoagulation dose is Coumadin 7.5 mg tabs: Take as directed by coumadin clinic..  The target INR is 2.5-3.5.  The next INR is due 12/23/2009.  Anticoagulation instructions were given to patient.  Results were reviewed/authorized by Bethena Midget, RN, BSN.  She was notified by Bethena Midget, RN, BSN.         Prior Anticoagulation Instructions: INR 2.7  Continue same dose of 1 tablet every day except 1/2 tablet on Sunday  Current Anticoagulation Instructions: INR 3.4 Contiue 7.5mg s everyday except on Sundays take 3.75mg s. Recheck in 4 weeks.

## 2010-06-16 NOTE — Assessment & Plan Note (Signed)
Summary: YEARLY/SL   Visit Type:  1 yr f/u  CC:  sob at times....denies any cp or edema.  History of Present Illness: Vanessa Owens returns today for F/U post AVR.  Last echo reviewed from 10/17/08 with mild LVH, normal EF, mean gradient across AVR 85mmHg/peak and mild AR.  Multiple somatic complaints including occasional palpitations, fatigue occasional dyspnea.  None of her complaints sound cardiac in nature.  No low grade fevers or signs of SBE.  No bleeding problems.  INR's have been good with coumadin compliance.  Discussed Pradaxa but don't like the fact that it is not reversable.    Current Problems (verified): 1)  Shortness of Breath  (ICD-786.05) 2)  Coumadin Therapy  (ICD-V58.61) 3)  Aortic Valve Replacement, Hx of  (ICD-V43.3) 4)  Aortic Valve Disorders  (ICD-424.1) 5)  Costochondritis  (ICD-733.6)  Current Medications (verified): 1)  Erin Bcp 2)  Caltrate 500mg  .... 1 Tab By Mouth Once Daily 3)  Coumadin 7.5 Mg Tabs (Warfarin Sodium) .... Take As Directed By Coumadin Clinic.  Allergies: 1)  ! 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   Vital Signs:  Patient profile:   50 year old female Height:      61 inches Weight:      142 pounds BMI:     26.93 Pulse rate:   70 / minute Pulse rhythm:   irregular BP  sitting:   128 / 78  (left arm) Cuff size:   regular  Vitals Entered By: Danielle Rankin, CMA (Oct 06, 2009 2:02 PM)  Physical Exam  General:  Affect appropriate Healthy:  appears stated age HEENT: normal Neck supple with no adenopathy JVP normal no bruits no thyromegaly Lungs clear with no wheezing and good diaphragmatic motion Heart:  S1/S2 click SEM and mild AR  murmur no ,rub, gallop or click PMI normal Abdomen: benighn, BS positve, no tenderness, no AAA no bruit.  No HSM or HJR Distal pulses intact with no bruits No edema Neuro non-focal Skin warm and dry    Impression & Recommendations:  Problem # 1:  COUMADIN THERAPY (ICD-V58.61) Continue current dose and F/U in clinic  Problem # 2:  AORTIC VALVE REPLACEMENT, HX OF (ICD-V43.3) F/U echo in a year.  Gradients a little high and mild AR.  Continue SBE prophylaxis Orders: EKG w/ Interpretation (93000)  Problem # 3:  SHORTNESS OF BREATH (ICD-786.05) Functional with no evidence of serious cariopulmonary limitations Orders: EKG w/ Interpretation (93000)  Patient Instructions: 1)  Your physician recommends that you schedule a follow-up appointment in: 1 yr with Dr Eden Emms 2)  Your physician recommends that you continue on your current medications as directed. Please refer to the Current Medication list given to you today.

## 2010-06-16 NOTE — Letter (Signed)
Summary: Appointment - Reminder 2  [facility], Main Office  [address]   [place], [place] [address]   Phone: [phone]  Fax: [phone]     July 30, 2009 MRN: [id number]   [facility] [address] [place], [place]  [address]   Dear [name],  Our records indicate that it is time to schedule a follow-up appointment with Dr. Eden Emms. It is very important that we reach you to schedule this appointment. We look forward to participating in your health care needs. Please contact us at the number listed above at your earliest convenience to schedule your appointment.  If you are unable to make an appointment at this time, give [place] a call so we can update our records.     Sincerely,   Migdalia Dk [facility] Scheduling Team

## 2010-06-16 NOTE — Medication Information (Signed)
Summary: rov/eac  Anticoagulant Therapy  Managed by: Cloyde Reams, RN, BSN Referring MD: Charlton Haws MD Supervising MD: Eden Emms MD, Theron Arista Indication 1: Aortic Valve Disorder (ICD-424.1) Lab Used: [facility] Site: [facility] INR POC 3.7 INR RANGE 2.5 - 3.5  Dietary changes: no    Health status changes: no    Bleeding/hemorrhagic complications: no    Recent/future hospitalizations: no    Any changes in medication regimen? no    Recent/future dental: no  Any missed doses?: no       Is patient compliant with meds? yes      Comments: Pt states she is planning on increasing vit K intake.    Allergies (verified): 1)  ! Morphine  Anticoagulation Management History:      The patient is taking warfarin and comes in today for a routine follow up visit.  Negative risk factors for bleeding include an age less than 27 years old.  The bleeding index is 'low risk'.  Negative CHADS2 values include Age > 60 years old.  The start date was 08/22/2002.  Her last INR was 6.2 RATIO.  Anticoagulation responsible provider: Eden Emms MD, Theron Arista.  INR POC: 3.7.  Cuvette Lot#: [id number].  Exp: 08/2010.    Anticoagulation Management Assessment/Plan:      The patient's current anticoagulation dose is Coumadin 7.5 mg tabs: Take as directed by coumadin clinic..  The target INR is 2.5-3.5.  The next INR is due 07/24/2009.  Anticoagulation instructions were given to patient.  Results were reviewed/authorized by Cloyde Reams, RN, BSN.  She was notified by Cloyde Reams RN.         Prior Anticoagulation Instructions: INR 3.9  Take 1/2 tablet today, the return to normal dosing schedule of 1 tablet daily, except 1/2 tablet on Sunday and Friday. Return to clinic in 2 weeks.  Current Anticoagulation Instructions: INR 3.7  Take 1/2 tablet today then resume same dosage 1 tablet daily except 1/2 tablet on Sundays and Fridays.  Recheck in 3 weeks.    Appended Document: rov/eac    Prescriptions: COUMADIN  7.5 MG TABS (WARFARIN SODIUM) Take as directed by coumadin clinic. Brand medically necessary #90 x 1   Entered by:   Cloyde Reams RN   Authorized by:   Colon Branch, MD, [facility]   Signed by:   Cloyde Reams RN on 07/03/2009   Method used:   Faxed to ...       [facility] (mail-order)             , [place]         Ph: [phone]       Fax: [phone]   RxID:   [id number]

## 2010-06-16 NOTE — Progress Notes (Signed)
Summary: Starting prednisone  Phone Note Call from Patient   Caller: Patient Call For: Coumadin clinic Summary of Call: Pt is starting on prednisone dose pack for inflammation.  Pt inquiring on effects of this medication with the coumadin.   Attempted TCB.  LMOM TCB. Initial call taken by: Cloyde Reams RN,  May 28, 2009 10:21 AM  Follow-up for Phone Call        Shelby Dubin, PharmD spoke with pt.  Made appt for follow-up on Friday 05/30/09. Follow-up by: Cloyde Reams RN,  May 28, 2009 12:33 PM

## 2010-06-18 ENCOUNTER — Ambulatory Visit: Admit: 2010-06-18 | Payer: Self-pay

## 2010-06-18 ENCOUNTER — Encounter (INDEPENDENT_AMBULATORY_CARE_PROVIDER_SITE_OTHER): Payer: Managed Care, Other (non HMO)

## 2010-06-18 ENCOUNTER — Encounter: Payer: Self-pay | Admitting: Cardiology

## 2010-06-18 DIAGNOSIS — Z7901 Long term (current) use of anticoagulants: Secondary | ICD-10-CM

## 2010-06-18 DIAGNOSIS — I359 Nonrheumatic aortic valve disorder, unspecified: Secondary | ICD-10-CM

## 2010-06-18 NOTE — Medication Information (Signed)
Summary: rov coumadin - [facility]  Anticoagulant Therapy  Managed by: Weston Brass, PharmD Referring MD: Charlton Haws MD Supervising MD: Tenny Craw MD, Gunnar Fusi Indication 1: Aortic Valve Disorder (ICD-424.1) Lab Used: [facility] Site: [facility] INR POC 2.7 INR RANGE 2.5 - 3.5  Dietary changes: no    Health status changes: yes       Details: UTI - cephalexin   Bleeding/hemorrhagic complications: no    Recent/future hospitalizations: no    Any changes in medication regimen? yes       Details: Cephalexin 500 mg 1capsuke  twice daily x7 days (started 05/19/10)  Recent/future dental: no  Any missed doses?: no       Is patient compliant with meds? yes       Allergies: 1)  ! Morphine  Anticoagulation Management History:      The patient is taking warfarin and comes in today for a routine follow up visit.  Negative risk factors for bleeding include an age less than 76 years old.  The bleeding index is 'low risk'.  Negative CHADS2 values include Age > 80 years old.  The start date was 08/22/2002.  Her last INR was 6.2 RATIO.  Anticoagulation responsible provider: Tenny Craw MD, Gunnar Fusi.  INR POC: 2.7.  Cuvette Lot#: [id number].  Exp: 06/2011.    Anticoagulation Management Assessment/Plan:      The patient's current anticoagulation dose is Coumadin 7.5 mg tabs: Take as directed by coumadin clinic..  The target INR is 2.5-3.5.  The next INR is due 06/18/2010.  Anticoagulation instructions were given to patient.  Results were reviewed/authorized by Weston Brass, PharmD.  She was notified by Stephannie Peters, PharmD Candidate .         Prior Anticoagulation Instructions: INR 3.0  Coumadin 7.5mg  tab - take 1 tab each day except 1/2 tab on SUN  Current Anticoagulation Instructions: INR 2.7  Coumadin 7.5 mg tablets - Continue 1 tablet daily except 1/2 tablet on Sundays.

## 2010-06-24 NOTE — Medication Information (Signed)
Summary: Vanessa Owens  Anticoagulant Therapy  Managed by: Cloyde Reams, RN, BSN Referring MD: Charlton Haws MD Supervising MD: Jens Som MD, Arlys John Indication 1: Aortic Valve Disorder (ICD-424.1) Lab Used: [facility] Site: [facility] INR POC 3.0 INR RANGE 2.5 - 3.5  Dietary changes: no    Health status changes: no    Bleeding/hemorrhagic complications: no    Recent/future hospitalizations: no    Any changes in medication regimen? no    Recent/future dental: no  Any missed doses?: no       Is patient compliant with meds? yes       Allergies: 1)  ! Morphine  Anticoagulation Management History:      The patient is taking warfarin and comes in today for a routine follow up visit.  Negative risk factors for bleeding include an age less than 48 years old.  The bleeding index is 'low risk'.  Negative CHADS2 values include Age > 34 years old.  The start date was 08/22/2002.  Her last INR was 6.2 RATIO.  Anticoagulation responsible provider: Jens Som MD, Arlys John.  INR POC: 3.0.  Cuvette Lot#: [id number].  Exp: 05/2011.    Anticoagulation Management Assessment/Plan:      The patient's current anticoagulation dose is Coumadin 7.5 mg tabs: Take as directed by coumadin clinic..  The target INR is 2.5-3.5.  The next INR is due 07/16/2010.  Anticoagulation instructions were given to patient.  Results were reviewed/authorized by Cloyde Reams, RN, BSN.  She was notified by Cloyde Reams RN.         Prior Anticoagulation Instructions: INR 2.7  Coumadin 7.5 mg tablets - Continue 1 tablet daily except 1/2 tablet on Sundays.   Current Anticoagulation Instructions: INR 3.0  Continue on same dosage 1 tablet daily except 1/2 tablet on Sundays.  Recheck in 4 weeks.

## 2010-06-29 DIAGNOSIS — I359 Nonrheumatic aortic valve disorder, unspecified: Secondary | ICD-10-CM

## 2010-06-29 DIAGNOSIS — Z7901 Long term (current) use of anticoagulants: Secondary | ICD-10-CM | POA: Insufficient documentation

## 2010-06-29 DIAGNOSIS — Z954 Presence of other heart-valve replacement: Secondary | ICD-10-CM

## 2010-07-16 ENCOUNTER — Encounter: Payer: Self-pay | Admitting: Cardiology

## 2010-07-16 ENCOUNTER — Encounter (INDEPENDENT_AMBULATORY_CARE_PROVIDER_SITE_OTHER): Payer: Managed Care, Other (non HMO)

## 2010-07-16 DIAGNOSIS — Z7901 Long term (current) use of anticoagulants: Secondary | ICD-10-CM

## 2010-07-16 DIAGNOSIS — I359 Nonrheumatic aortic valve disorder, unspecified: Secondary | ICD-10-CM

## 2010-07-16 LAB — CONVERTED CEMR LAB: POC INR: 2.1

## 2010-07-23 NOTE — Medication Information (Signed)
Summary: rov/ewj  Anticoagulant Therapy  Managed by: Cloyde Reams, RN, BSN Referring MD: Charlton Haws MD Supervising MD: Patty Sermons Indication 1: Aortic Valve Disorder (ICD-424.1) Lab Used: [facility] Site: [facility] INR POC 2.1 INR RANGE 2.5 - 3.5  Dietary changes: no    Health status changes: no    Bleeding/hemorrhagic complications: no    Recent/future hospitalizations: no    Any changes in medication regimen? no    Recent/future dental: no  Any missed doses?: no       Is patient compliant with meds? yes       Allergies: 1)  ! Morphine  Anticoagulation Management History:      The patient is taking warfarin and comes in today for a routine follow up visit.  Negative risk factors for bleeding include an age less than 73 years old.  The bleeding index is 'low risk'.  Negative CHADS2 values include Age > 67 years old.  The start date was 08/22/2002.  Her last INR was 6.2 RATIO.  Anticoagulation responsible provider: Maree Ainley.  INR POC: 2.1.  Cuvette Lot#: [id number].  Exp: 05/2011.    Anticoagulation Management Assessment/Plan:      The patient's current anticoagulation dose is Coumadin 7.5 mg tabs: Take as directed by coumadin clinic..  The target INR is 2.5-3.5.  The next INR is due 08/06/2010.  Anticoagulation instructions were given to patient.  Results were reviewed/authorized by Cloyde Reams, RN, BSN.  She was notified by Cloyde Reams RN.         Prior Anticoagulation Instructions: INR 3.0  Continue on same dosage 1 tablet daily except 1/2 tablet on Sundays.  Recheck in 4 weeks.   Current Anticoagulation Instructions: INR 2.1  Take 1.5 tablets today, then resume same dosage 1 tablet daily except 1/2 tablet on Sundays.  Recheck in 3 weeks.

## 2010-07-31 ENCOUNTER — Encounter: Payer: Self-pay | Admitting: Emergency Medicine

## 2010-07-31 ENCOUNTER — Ambulatory Visit
Admission: RE | Admit: 2010-07-31 | Discharge: 2010-07-31 | Disposition: A | Discharge status: Home / Self Care | Payer: Managed Care, Other (non HMO) | Source: Ambulatory Visit | Attending: Emergency Medicine | Admitting: Emergency Medicine

## 2010-07-31 ENCOUNTER — Inpatient Hospital Stay (INDEPENDENT_AMBULATORY_CARE_PROVIDER_SITE_OTHER)
Admission: RE | Admit: 2010-07-31 | Discharge: 2010-07-31 | Disposition: A | Discharge status: Home / Self Care | Payer: Managed Care, Other (non HMO) | Source: Ambulatory Visit | Attending: Emergency Medicine | Admitting: Emergency Medicine

## 2010-07-31 ENCOUNTER — Other Ambulatory Visit: Payer: Self-pay | Admitting: Emergency Medicine

## 2010-07-31 DIAGNOSIS — R071 Chest pain on breathing: Secondary | ICD-10-CM

## 2010-07-31 DIAGNOSIS — R079 Chest pain, unspecified: Secondary | ICD-10-CM | POA: Insufficient documentation

## 2010-07-31 DIAGNOSIS — M940 Chondrocostal junction syndrome [Tietze]: Secondary | ICD-10-CM

## 2010-08-04 NOTE — Assessment & Plan Note (Signed)
Summary: DISCOMFORT AT STERNUM AREA/TJ (rm 5)   Vital Signs:  Patient Profile:   50 Years Old Female CC:      mid chest/sternal pain x 5 days Height:     61 inches Weight:      141 pounds O2 Sat:      100 % O2 treatment:    Room Air Temp:     99.3 degrees F oral Pulse rate:   82 / minute Resp:     14 per minute BP sitting:   144 / 83  (left arm) Cuff size:   regular  Pt. in pain?   yes    Location:   sternum    Type:       sharp  Vitals Entered By: Lajean Saver RN (July 31, 2010 6:54 PM)                   Updated Prior Medication List: * ERIN BCP  * CALTRATE 500MG  1 tab by mouth once daily COUMADIN 7.5 MG TABS (WARFARIN SODIUM) Take as directed by coumadin clinic. [BMN]  Current Allergies (reviewed today): ! MORPHINEHistory of Present Illness History from: patient Chief Complaint: mid chest/sternal pain x 5 days History of Present Illness: Pt complains of pain. Location: mid-lower sternum, radiating beneath breasts (worse on right). Onset: 5 days ago Description/Quality of Pain: sharp Intensity of pain: moderate Modifying Factors: has not tried any pain meds. Trauma: None Symptoms Worse with: direct pressure (pushing) on the area. Worse with stretching or twisting. Worse with deep breath. Better with: Not twisting. Exertion does not change or worsen the pain. Assoc. sxs: No dyspnea or n or vom or abdom pain or fever or chills or cough.   I reviewed cardiologist office visit note from 09/2009: " Last echo reviewed from 10/17/08 with mild LVH, normal EF, mean gradient across AVR 67mmHg/peak and mild AR.  Multiple somatic complaints including occasional palpitations, fatigue occasional dyspnea.  None of her complaints sound cardiac in nature.  No low grade fevers or signs of SBE.  No bleeding problems.  INR's have been good with coumadin compliance.  Discussed Pradaxa but don't like the fact that it is not reversable."-------dx of "costochondritis" at that ov.    REVIEW OF SYSTEMS Constitutional Symptoms      Denies fever, chills, night sweats, weight loss, weight gain, and fatigue.  Eyes       Denies change in vision, eye pain, eye discharge, glasses, contact lenses, and eye surgery. Ear/Nose/Throat/Mouth       Denies hearing loss/aids, change in hearing, ear pain, ear discharge, dizziness, frequent runny nose, frequent nose bleeds, sinus problems, sore throat, hoarseness, and tooth pain or bleeding.  Respiratory       Denies dry cough, productive cough, wheezing, shortness of breath, asthma, bronchitis, and emphysema/COPD.  Cardiovascular       Denies murmurs, chest pain, and tires easily with exhertion.    Gastrointestinal       Denies stomach pain, nausea/vomiting, diarrhea, constipation, blood in bowel movements, and indigestion. Genitourniary       Denies painful urination, kidney stones, and loss of urinary control. Neurological       Denies paralysis, seizures, and fainting/blackouts. Musculoskeletal       Denies muscle pain, joint pain, joint stiffness, decreased range of motion, redness, swelling, muscle weakness, and gout.  Skin       Denies bruising, unusual mles/lumps or sores, and hair/skin or nail changes.  Psych  Denies mood changes, temper/anger issues, anxiety/stress, speech problems, depression, and sleep problems. Other Comments: Patient c/o of pain @ her lower sternum. The pain will radiate out both directions, mostly to the right. SHe has more pain with movement. Pain x 5 days. No other associated symptoms. She had the same thing happen to her around Christmas, she did not get examined and it went away.   Past History:  Past Medical History: Reviewed history from 10/03/2008 and no changes required. 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 Jude C-section  Family History: Reviewed history from  03/31/2008 and no changes required. Mother with hypertension  Social History: Reviewed history from 10/03/2008 and no changes required. 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 Uncomfortable when twisting trunk, but can ambulate with any distress. Head: normocephalic, atraumatic Eyes: conjunctivae and lids normal. No icterus. Oral/Pharynx: tongue normal, posterior pharynx without erythema or exudate Neck: neck supple,  trachea midline, no masses. Carotids wnl. Chest/Lungs: no rales, wheezes, or rhonchi bilateral, breath sounds equal without effort.   CHEST WALL: no deformity of ecchymosis or rash. Midline sternal scar intact. + exquisite tenderness to palpation over mid and lower sternum and right and left costochondral junctions Heart: S1/S2 click SEM and mild AR  murmur no ,rub, gallop  Extremities: No cce. Skin: no obvious rashes or lesions Breast exam deferred as pt denies any breast pain, and exams by her gyn have been wnl. Assessment New Problems: CHEST PAIN UNSPECIFIED (ICD-786.50)  Likely Chostochondritis. No evidence of any acute cardio-resp cause.---EKG: NSR. No acute abnormalities.  ----CXR: No active disease.  Plan New Medications/Changes: VICODIN 5-500 MG TABS (HYDROCODONE-ACETAMINOPHEN) 1 by mouth q 6 hrs as needed severe pain  #12 x 0, 07/31/2010, Lajean Manes MD PREDNISONE (PAK) 10 MG TABS (PREDNISONE) take 6 day dosepak as directed  #1 pak of #21 x 0, 07/31/2010, Lajean Manes MD  New Orders: T-DG Chest 2 View [71020] Est. Patient Level IV [16109] EKG w/ Interpretation [93000] Planning Comments:   heat. Wrote note excusing from work today. Antic. guidance discussed.  Follow Up: Follow up on an as needed basis, Follow up with Primary Physician Follow Up: also with Dr.  Juliann Pares, her cardiologist (has appt within 6 wks for annual recheck), but reck sooner prn. Have [employer] rechecked within 7-10 days.  The patient and/or caregiver has been counseled thoroughly with regard to medications prescribed including dosage, schedule, interactions, rationale for use, and possible side effects and they verbalize understanding.  Diagnoses and expected course of recovery discussed and will return if not improved as expected or if the condition worsens. Patient and/or caregiver verbalized understanding.  Prescriptions: VICODIN 5-500 MG TABS (HYDROCODONE-ACETAMINOPHEN) 1 by mouth q 6 hrs as needed severe pain  #12 x 0   Entered and Authorized by:   Lajean Manes MD   Signed by:   Lajean Manes MD on 07/31/2010   Method used:   Handwritten   RxID:   [id number] PREDNISONE (PAK) 10 MG TABS (PREDNISONE) take 6 day dosepak as directed  #1 pak of #21 x 0   Entered and Authorized by:   Lajean Manes MD   Signed by:   Lajean Manes MD on 07/31/2010   Method used:   Rosezetta Schlatter  RxID:   [id number]   Orders Added: 1)  T-DG Chest 2 View [71020] 2)  Est. Patient Level IV [14782] 3)  EKG w/ Interpretation [93000]

## 2010-08-06 ENCOUNTER — Ambulatory Visit (INDEPENDENT_AMBULATORY_CARE_PROVIDER_SITE_OTHER): Payer: Managed Care, Other (non HMO) | Admitting: *Deleted

## 2010-08-06 DIAGNOSIS — Z7901 Long term (current) use of anticoagulants: Secondary | ICD-10-CM

## 2010-08-06 DIAGNOSIS — I359 Nonrheumatic aortic valve disorder, unspecified: Secondary | ICD-10-CM

## 2010-08-06 DIAGNOSIS — Z954 Presence of other heart-valve replacement: Secondary | ICD-10-CM

## 2010-08-06 LAB — POCT INR: INR: 3.7

## 2010-08-06 NOTE — Patient Instructions (Signed)
INR 3.7 Today only take 1/2 pill then resume 1 pill everyday except 1/2 pill on Sundays. Recheck in 4weeks.

## 2010-09-02 ENCOUNTER — Other Ambulatory Visit: Payer: Self-pay | Admitting: *Deleted

## 2010-09-02 ENCOUNTER — Other Ambulatory Visit: Payer: Self-pay

## 2010-09-02 MED ORDER — WARFARIN SODIUM 7.5 MG PO TABS: ORAL_TABLET | ORAL | Status: DC

## 2010-09-03 ENCOUNTER — Ambulatory Visit (INDEPENDENT_AMBULATORY_CARE_PROVIDER_SITE_OTHER): Payer: Managed Care, Other (non HMO) | Admitting: *Deleted

## 2010-09-03 DIAGNOSIS — Z954 Presence of other heart-valve replacement: Secondary | ICD-10-CM

## 2010-09-03 DIAGNOSIS — Z7901 Long term (current) use of anticoagulants: Secondary | ICD-10-CM

## 2010-09-03 DIAGNOSIS — I359 Nonrheumatic aortic valve disorder, unspecified: Secondary | ICD-10-CM

## 2010-09-12 ENCOUNTER — Encounter: Payer: Self-pay | Admitting: Cardiovascular Disease

## 2010-09-15 ENCOUNTER — Encounter: Payer: Self-pay | Admitting: *Deleted

## 2010-09-16 ENCOUNTER — Ambulatory Visit (INDEPENDENT_AMBULATORY_CARE_PROVIDER_SITE_OTHER): Payer: Managed Care, Other (non HMO) | Admitting: Cardiovascular Disease

## 2010-09-16 ENCOUNTER — Other Ambulatory Visit: Payer: Self-pay | Admitting: Obstetrics & Gynecology

## 2010-09-16 ENCOUNTER — Encounter: Payer: Self-pay | Admitting: *Deleted

## 2010-09-16 DIAGNOSIS — Z1231 Encounter for screening mammogram for malignant neoplasm of breast: Secondary | ICD-10-CM

## 2010-09-16 DIAGNOSIS — I359 Nonrheumatic aortic valve disorder, unspecified: Secondary | ICD-10-CM

## 2010-09-16 DIAGNOSIS — Z7901 Long term (current) use of anticoagulants: Secondary | ICD-10-CM

## 2010-09-16 NOTE — Assessment & Plan Note (Signed)
INR;s Rx with no bleeding diathesis.

## 2010-09-16 NOTE — Assessment & Plan Note (Signed)
Normal valve sound.  No AR on exam.  Echo to assess LV/Valve

## 2010-09-16 NOTE — Patient Instructions (Addendum)
Echo to check valve  AT PT'S CONVENIENCE dx valve replacement SBE prophylaxis F/U Dr Eden Emms in a year

## 2010-09-16 NOTE — Progress Notes (Signed)
Vanessa Owens returns today for F/U post AVR in 2001 for bicuspid valve.  Previously followed by Dr Fraser Din.  Last echo reviewed from 10/17/08 with mild LVH, normal EF, mean gradient across AVR 48mmHg/peak and mild AR.  Multiple somatic complaints including occasional palpitations, fatigue occasional dyspnea.  None of her complaints sound cardiac in nature.  No low grade fevers or signs of SBE.  No bleeding problems.  INR's have been good with coumadin compliance.  Discussed Pradaxa but don't like the fact that it is not reversable.  Still working hard at [employer] and [employer].  Recent visit to Dr Georgina Pillion at [facility] facility for chostrocondritis.    ROS: Denies fever, malais, weight loss, blurry vision, decreased visual acuity, cough, sputum, SOB, hemoptysis, pleuritic pain, palpitaitons, heartburn, abdominal pain, melena, lower extremity edema, claudication, or rash.   General: Affect appropriate Healthy:  appears stated age HEENT: normal Neck supple with no adenopathy JVP normal no bruits no thyromegaly Lungs clear with no wheezing and good diaphragmatic motion Heart:  S1/S2 click SEM  No AR murmur no ,rub, gallop or click PMI normal Abdomen: benighn, BS positve, no tenderness, no AAA no bruit.  No HSM or HJR Distal pulses intact with no bruits No edema Neuro non-focal Skin warm and dry No muscular weakness   Current Outpatient Prescriptions  Medication Sig Dispense Refill  . amoxicillin (AMOXIL) 500 MG capsule Take 1 tablet by mouth. Prior to dental procedures      . Calcium Carbonate-Vit D-Min (CALTRATE PLUS PO) Take by mouth daily.        . norethindrone (MICRONOR) 0.35 MG tablet daily.       Marland Kitchen warfarin (COUMADIN) 7.5 MG tablet Take as directed by Anticoagulation clinic   90 tablet  1  . DISCONTD: HYDROcodone-acetaminophen (VICODIN) 5-500 MG per tablet Take 1 tablet by mouth Every 6 hours as needed.        Allergies  Morphine  Electrocardiogram:     07/31/10  NSR 80 normal  ECG  Assessment and Plan

## 2010-09-29 NOTE — Assessment & Plan Note (Signed)
[facility] HEALTHCARE                            CARDIOLOGY OFFICE NOTE   NAME:[name], [name]                      MRN:          [id number]  DATE:09/26/2007                            DOB:          1960/06/04    Vanessa Owens returns today for followup.  She has had a previous aortic valve  replaced with bicuspid aortic valve.  Her last echo was May 2008, showed  a mean gradient of 16, peak gradient of 36, and mild periprosthetic  leak.  She has been doing well.  She has not had any symptoms referable  to her heart.  Her coagulation has been up and down.  She is cut back on  the greens in her diet and, as expected, her Coumadin has been running a  little high.  She has not had any bleeding but her dose has been  adjusted down.  [name] has done remarkable job of losing weight.  She  had previously been in the 170 range and is down to 122.  I  congratulated her on this.  She has been using the [facility] Diet  with high protein supplements.  She needs to get into an exercise  program.   She has previously had elevated triglycerides and has been on fish oil.  I am sure that these are improved now.   REVIEW OF SYSTEMS:  Otherwise remarkable for some pain in her chest.  She may have some musculoskeletal problems from her previous surgery but  they are minor.  Otherwise negative.   CURRENT MEDS:  Include birth control pills, Coumadin as directed,  Caltrate, multivitamins, fish oil.   EXAM:  Is remarkable for healthy-appearing middle-aged white female who  has lost quite a bit of weight.  She weighs 122, blood pressure 170/77, pulse 76 regular, respiratory 14,  afebrile.  HEENT:  Unremarkable.  Carotids are normal without bruit, no lymphadenopathy, thyromegaly JVP  elevation.  LUNGS:  Clear diaphragmatic motion.  No wheezing.  S1 with a metallic S2 click.  Soft systolic murmur with mild aortic  insufficiency murmur.  Sternum well-healed.  No abnormal wires or  crepitus felt.  ABDOMEN:  Bowel sounds positive.  No AAA no bruit, no  hepatosplenomegaly, hepatojugular reflux.  No tenderness.  Distal pulses intact.  No edema.  NEURO:  Nonfocal.  SKIN:  Warm and dry.  No muscular weakness.   EKG normal.   IMPRESSION:  1. Aortic valve replacement.  Followup echo in a year.  Continue SBE      prophylaxis.  She only has a mild periprosthetic leak and the valve      actually sounds quite good.  2. Weight loss.  I congratulated her on this.  She will continue with      the [facility] Diet and try to get into an exercise program to      maintain her weight loss.  3. Anticoagulation.  Follow up in Coumadin Clinic.  Try to adjust her      diet for a steady amount of green vegetables.  Coumadin dose      decreased appropriately.  4. History of hypertriglyceridemia.  Follow up with primary care M.D.      Continue fish oil.  Not likely to be a problem given her low      carbohydrate diet and recent weight loss.  5. Birth control.  Continue Jolivette birth control.  Nonsmoking.  No      evidence of hypertension or previous clots.     Noralyn Pick. Eden Emms, MD, [facility]  Electronically Signed    PCN/MedQ  DD: 09/26/2007  DT: 09/26/2007  Job #: [id number]

## 2010-09-29 NOTE — Assessment & Plan Note (Signed)
[facility] HEALTHCARE                            CARDIOLOGY OFFICE NOTE   NAME:Amendola, AMONDA BRILLHART                      MRN:          [id number]  DATE:10/03/2006                            DOB:          Jan 24, 1961    Mrs. Vanessa Owens returns today for followup.  In March 2001 she had aortic  valve replacement for bicuspid aortic valve.   She has been doing fairly well.  In talking to her she has not had any  significant chest pain, PND or orthopnea.  There has been no syncope or  palpitations.   She has been going to the dentist once or twice a year.  There are no  issues here.   She has had no stigmata of SBE and no fevers.  The patient has been on  the [facility] Diet, she has lost about 20 pounds.  I congratulated  her on this.   Her review of systems otherwise negative.  She has been following SBE  prophylaxis, 4 amoxicillin tablets. In regards to her Coumadin, she has  been getting it checked every 3 to 4 weeks.  Her diet has been higher in  greens though on her [facility] regimen and her Coumadin dosages have  had to be increased.  I cautioned her in regards to any change in her  diet back to less greens would necessitate closer monitoring.   The patient had a 2-D echocardiogram today.  It took me 10 minutes to  review it.  She has normal functioning prosthetic aortic valve with a  mild periprosthetic leak.  Her LV function is normal.  Overall the  periprosthetic leak is similar to the echo from November 2007 which I  also reviewed.   Her medications currently include:  1. Coumadin as directed.  2. Birth control pills.  3. Caltrate.  4. Multivitamins.   Her exam is remarkable for a healthy-appearing middle-aged female in no  distress.  Mood is quite animated.  Weight is 147, blood pressure is 117/69, pulse 72 and regular.  She is  afebrile.  HEENT:  Is normal.  Carotid is normal without bruits.  There is no thyromegaly, no  lymphadenopathy,  no JVP elevation.  LUNGS:  Are clear with no wheezing.  There is normal diaphragmatic  motion.  HEART:  Sounds are remarkable for a mechanical second heart sound with a  systolic ejection murmur.  I cannot hear a diastolic murmur.  PMI is  mildly increased by not displaced.  ABDOMEN:  Is benign.  Bowel sounds are positive.  No hepatosplenomegaly,  no hepatojugular reflux.  There is no masses, no AAA.  Femorals are +3 bilaterally.  DTs are +3 bilaterally. There is no edema.  No varicosities, no lymphadenopathy.  NEUROLOGICAL:  Exam is nonfocal, muscular exam shows no weaknesses.   IMPRESSION:  1. Stable aortic valve replacement with mild periprosthetic leak by      echo.  The patient has normal left ventricular function.  Her      dentition is in good shape.  She will continue her antibiotic      prophylaxis  with amoxicillin.  2. She will have an INR checked today.  3. Her weight is excellent.  She will continue her diet and I      congratulated her on this.  4. Her risk factors are otherwise well modified.  5. She did have a stress test tat the end of last year which was non-      ischemic with good left ventricular function.  Overall she is doing      well, she will be seen in a year with a followup echo, unless she      has any new problems.     Noralyn Pick. Eden Emms, MD, [facility]  Electronically Signed    PCN/MedQ  DD: 10/03/2006  DT: 10/03/2006  Job #: [id number]

## 2010-10-01 ENCOUNTER — Encounter: Payer: Managed Care, Other (non HMO) | Admitting: *Deleted

## 2010-10-02 NOTE — H&P (Signed)
Vanessa Owens, Vanessa Owens               ACCOUNT NO.:  [id number]   MEDICAL RECORD NO.:  [id number]          PATIENT TYPE:  EMS   LOCATION:  ED                           FACILITY:  [facility]   PHYSICIAN:  Thomos Lemons, D.O. [facility]   DATE OF BIRTH:  [birth year]   DATE OF ADMISSION:  03/30/2005  DATE OF DISCHARGE:                                HISTORY & PHYSICAL   CHIEF COMPLAINT:  Gross hematuria.   PRIMARY CARE PHYSICIAN:  Dr. Ruthine Dose but has seen Dr. Loreen Freud in the  [facility].   HISTORY OF PRESENT ILLNESS:  The patient is a 50 year old white female with  past medical history of aortic stenosis, status post AVR with St. Jude's  valve in March of 2001. She presents with lower back pain/flank pain and  gross hematuria. The patient states that on Saturday the patient started to  have pain in her right side that felt like previous menstrual cramps;  however, the patient has been on birth control pills for some time and  states she has not had a normal menstruation in several years. The patient  performed some strenuous yard work on Sunday and felt soreness in her lower  back, also her legs. She was unsure whether the pain was coming from the  strenuous activity or possible kidney stone. She does have a history of  nephrolithiasis in the past. Last kidney stone attack was three to four  years ago, unclear whether the patient had spontaneous passage but did not  have any specific procedure for stone retrieval.   The patient on Monday called the  oncall physician who recommended  seeing Urgent Care in [place] where the patient lives. Urgent Care  performed a UA and was found to have bacteruria and signs of UTI. She was  started on Cipro and given pain medications. They did check an INR at that  time but the lab value was sent out and was later called regarding  supratherapeutic INR and they recommended evaluation in the nearest  emergency room. The patient did take two  doses of Cipro since evaluation at  Urgent Care.   The patient denies any history of any other antibiotic within the last one  or two week time. The patient did approximately two weeks ago have a  relatively low INR of approximately 1.9 and was told to increase her  Coumadin x2 days at 10 milligrams. The patient normally takes 7.5 on Monday  through Saturday and 5 milligrams on Sunday. She was told to after taking  two days of 10 milligrams to resume 7.5 milligrams for all seven days.   REVIEW OF SYSTEMS:  The patient denies any fever, some possible chills over  the weekend. The patient has had a history of intermittent strong odor to  her urine on and off since March of 2006. She has had her urine checked by  her OB/GYN and has been unremarkable.   PAST MEDICAL HISTORY:  1.  Congenital bicuspid aortic stenosis, status post AVR with St. Jude's      valve in March of 2001, on chronic  anticoagulation with Coumadin.  2.  History of nephrolithiasis.  3.  History of right ovarian cyst, resolved on its own.   PAST SURGICAL HISTORY:  Status post C-section x2 in 1984 and 1992.   SOCIAL HISTORY:  The patient is divorced. Has two children. She currently  lives with her boyfriend. Occupation is a Advertising copywriter for  [employer].   HABITS:  The patient drinks occasionally. Averages two to four drinks per  week. Denies any tobacco use.   MEDICATIONS:  Coumadin 7.5 milligrams daily. The patient denies any over-the-  counter or nutritional supplements. She takes birth control pills.   ALLERGIES:  None known.   FAMILY HISTORY:  Mother age 33 has diabetes and hypertension. The patient  has three siblings-one of which also has a history of nephrolithiasis. The  patient does not know her father.   LABORATORY DATA:  PT was greater than 90. INR was greater than 11.9. CBC  showed WBC of 8.6, H&H of 13.5 and 38.7, platelets of 179,000. UA was  unremarkable other than a large amount of  hemoglobin; however, please note  the patient reports having urine indicative of UTI from Urgent Care on  Monday. Basic metabolic profile shows sodium 147, potassium 3.9, chloride of  101, CO2 26, BUN 8, creatinine 1.1, blood sugar 104, calcium 8.8. CAT scan  of the abdomen and pelvis was performed in the ER without contrast. No upper  urinary tract calculi identified, slight fullness of right renal collecting  system with mild edema of right kidney with stranding around the right  proximal ureter, possible etiology of mild obstruction versus infection  versus spontaneous hemorrhage.   PHYSICAL EXAMINATION:  VITAL SIGNS:  Temperature is 98.4, pulse is 84,  respirations 18, blood pressure is 162/93.  GENERAL:  The patient is a pleasant 50 year old white female in no apparent  distress.  HEENT:  Normocephalic and atraumatic. Pupils were equal and reactive to  light bilaterally. Extraocular movements intact. The patient was anicteric.  Conjunctivae did not reveal any petechiae. Oral exam was unremarkable.  Auditory canals and tympanic membranes were clear bilaterally.  NECK:  Supple with no adenopathy or thyromegaly.  LUNGS:  Normal respiratory effort.  CHEST:  Clear to auscultation bilaterally. No rhonchi, rales, or wheezing.  CARDIOVASCULAR:  Regular rate and rhythm. Positive systolic ejection murmur  right sternal border with audible click.  ABDOMEN:  Soft and nontender. There was mild right flank tenderness and also  mild suprapubic pressure/tenderness.  EXTREMITIES:  No clubbing, cyanosis, or edema.  NEUROLOGICAL:  Cranial nerves II through XII grossly intact. No focal  deficits.   IMPRESSION/PLAN:  1.  Gross hematuria.  2.  Possible nephrolithiasis complicated by urinary tract infection.  3.  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 Jude's valve. 4.  History of congenital bicuspid aortic stenosis.   RECOMMENDATIONS:  Despite UA that  is normal in the [facility]  Emergency Room, the patient likely has possible cystitis/UTI. Unclear from  review of CAT with her stranding could be from an infectious process. The  patient has taken her dose of Cipro already today. For current management,  she will be started on IV fluids. We will currently hold the antibiotics  until the a.m. She was given two units of FFP while in the ER and we will  hold the Coumadin and monitor INR daily. We will try to avoid over  correction of INR considering her history  of prosthetic valve. If the  patient's INR is still significantly therapeutic in the a.m., we will  consider 2.5 milligrams of vitamin K p.o.   Once coagulopathy has stabilized, the patient will likely need to finish  antibiotic course for possible urinary tract infection/possible  pyelonephritis.      Thomos Lemons, D.O. [facility]  Electronically Signed     RY/MEDQ  D:  03/30/2005  T:  03/31/2005  Job:  [id number]   cc:   Loreen Freud, M.D.  [address] [place]  [place] [address]

## 2010-10-02 NOTE — Op Note (Signed)
[facility]  Patient:    Vanessa Owens, Vanessa Owens                      MRN: [id number] Proc. Date: 08/10/99 Adm. Date:  [id number] Attending:  Mikey Bussing CC:         Meade Maw, M.D.             CVTS office                           Operative Report  PREOPERATIVE DIAGNOSIS:  Congenital bicuspid aortic stenosis.  POSTOPERATIVE DIAGNOSIS:  Congenital bicuspid aortic stenosis.  OPERATION PERFORMED:  Aortic valve replacement for aortic stenosis (19HP St. Jude valve).  SURGEON:  Mikey Bussing, M.D.  ASSISTANTLuretha Rued. Ezzard Standing, P.A.  ANESTHESIA:  General.  ANESTHESIOLOGIST:  Kaylyn Layer. Michelle Piper, M.D.  INDICATIONS FOR PROCEDURE:  The patient is a 50 year old female with a long history of a cardiac murmur.  She developed exertional dyspnea and decreasing exercise tolerance and underwent evaluation by Dr. Meade Maw.  Cardiac echo revealed a significant transvalvular aortic gradient with left ventricular hypertrophy.  A  subsequent cardiac catheterization confirmed significant aortic stenosis with transvalvular gradient in the 50 to 60 mmHg range and she was referred for aortic valve replacement.  Prior to the operation the patient was examined on more than one occasion in the office where the results of her cardiac catheterization and echocardiogram were  reviewed.  I discussed the indications and expected benefits of aortic valve replacement with this patient at length.  We discussed the various valve replacement operations and decided together than a St. Jude valve replacement would be in her best interest.  She had severe anxiety regarding the operation and was very emotional during at least two of her office visits.  She eventually accepted the necessity of the operation in order to improve her symptoms, preserve her ventricular function and improve her survival.  We discussed the potential associated risks of the operation  including the risks of MI, CVA, bleeding, infection, and death.  We also discussed the associated requirement for lifelong commitment to Coumadin anticoagulation with the placement of a St. Jude mechanical valve.  She understood all these aspects of the operation and postoperative care and agreed to proceed with the operation under informed consent.  OPERATIVE FINDINGS:  The heart was hypertrophied.  The ascending aorta was small. The aortic annulus was small and the valve was severely deformed with two severely fused bicuspid commissures with a very small effective valve area.  There was calcification in parts of the annulus.  A 19HP valve was selected as the best fit for this annulus.  DESCRIPTION OF PROCEDURE:  The patient was brought to the operating room and placed supine on the operating room table where general anesthesia was induced under invasive hemodynamic monitoring.  The chest, abdomen and legs were prepped with  Betadine and draped as a sterile field.  A transesophageal 2-D echocardiogram confirmed the preoperative diagnosis of severe aortic stenosis.  A median sternotomy was performed and the pericardium was opened.  Heparin was administered and ACT was documented as being therapeutic.  Through pursestrings placed in the ascending aorta and right atrium, the patient was cannulated and placed on cardiopulmonary bypass and cooled to 32 degrees.  The cardioplegia cannulas were placed for both antegrade and retrograde delivery of cold blood cardioplegia and a left ventricular vent  was placed via the right superior pulmonary vein.  The patient was cooled to 28 degrees and as the aortic crossclamp was applied, 500 c of cold blood cardioplegia was delivered to the aortic root with immediate cardioplegic arrest and septal temperature dropping to less than 12 degrees. Topical iced saline slush was used to augment myocardial preservation and a pericardial insulator pad  was used to protect the left phrenic nerve.  A transverse aortotomy was performed.  The aortic valve was inspected.  It was bicuspid with a very long area of fusion on each commissure of the deformed two  leaflet valve.  There was calcification in the valve and annulus.  This was all  excised, debrided and irrigated with copious amounts of cold saline.  The valve  annulus was sized to a 19HP St. Jude sizer.  Subannuluar 2-0 pledgeted sutures f Ethibond (numbering 11 sutures) were placed in a mattress configuration around he annulus.  They were then placed through the sewing ring of the valve which was seated and the sutures were tied.  The valve was inspected and the leaflets opened and closed without interference.  The valve fit the annulus nicely.  The aortotomy was then closed in two layers using the running 4-0 Prolene and air was vented rom the heart and coronaries with a dose of warm blood retrograde cardioplegia. The usual deairing maneuvers were also performed as the aortic crossclamp was removed. The aortotomy suture was tied.  The heart was cardioverted back to a regular rhythm.  The patient was rewarmed o 37 degrees and a temporary wires were applied after the left ventricular vent and cardioplegia cannulas were removed.  The ventilator was resumed and the patient  weaned from cardiopulmonary bypass without difficulty without inotropes. Protamine was administered and the cannulas were removed.  The mediastinum was irrigated ith warm antibiotic irrigation.  The pericardium was loosely reapproximated.  The sternum was closed with wire after placement of two mediastinal chest tubes. The pectoralis fascia was closed with running Vicryl and the skin was closed with a  subcuticular Vicryl.  Total cardiopulmonary bypass time was 100 minutes with aortic crossclamp time of 65 minutes.  The transesophageal echocardiogram examination following bypass showed a normal  functioning aortic St. Jude valve without evidence of aortic regurgitation and preserved left ventricular function. DD:  08/10/99 TD:  08/11/99 Job: [id number]

## 2010-10-02 NOTE — Consult Note (Signed)
Vanessa Owens, HUTMACHER               ACCOUNT NO.:  [id number]   MEDICAL RECORD NO.:  [id number]          PATIENT TYPE:  INP   LOCATION:  5156                         FACILITY:  [facility]   PHYSICIAN:  Maretta Bees. Vonita Moss, M.D.DATE OF BIRTH:  01-15-1961   DATE OF CONSULTATION:  03/31/2005  DATE OF DISCHARGE:                                   CONSULTATION   REASON FOR CONSULTATION:  I was asked to see this 50 year old white female  for a five-day history of gross hematuria, right flank pain, and passage of  blood clots.  She has not had any fever.  Her INR was up to 11.  She was  initially put on Cipro for a couple of days when she presented to an Urgent  Care Center.  She continued to have problems and was admitted to this  facility.   CT scan showed no stones.  There was some fullness of the right upper  collecting system and some stranding consistent with inflammation or  obstruction.   I actually saw her in 2001 with very similar symptom complex, with right-  sided pain, gross hematuria, and her INR at that time was 8.  She never did  pass a stone after that and has never passed a stone in her lifetime to her  knowledge.  She has not had significant problems with UTIs.   Her renal function is normal.   White count is only 5700, and she is afebrile.   PAST MEDICAL HISTORY:  1.  Aortic stenosis with a St. Jude's valve prosthesis.  2.  C-section.   MEDICATIONS ON ADMISSION:  1.  Coumadin.  2.  She was on Cipro for a short term which has now been stopped.   ALLERGIES:  ALLERGIES ARE DENIED.   SOCIAL HISTORY:  She does not smoke and drinks alcohol socially.   REVIEW OF SYSTEMS:  She has had no bleeding from other sources.   PHYSICAL EXAMINATION:  VITAL SIGNS:  Blood pressure 152/93, pulse 84,  temperature 98.4.  GENERAL:  She is alert and oriented and in no significant distress.  She  appears her stated age.  ABDOMEN:  No CVA or abdominal tenderness.   IMPRESSION:  Gross  hematuria with clots, right flank pain, and grossly  abnormal international normalized ratio which makes me think that  spontaneous intrarenal bleed with clot colic or pain just from intrarenal  bleeding is the cause of her current problem.  There is no evidence of a  stone.  She does not seem to have clinical evidence of pyelonephritis.   PLAN:  She can continue with rest, IV fluids, and analgesics, and strain her  urine for stones.  I will follow her with you.      Maretta Bees. Vonita Moss, M.D.  Electronically Signed     LJP/MEDQ  D:  03/31/2005  T:  04/01/2005  Job:  [id number]   cc:   Thomos Lemons, D.O. [facility]  [address] [place], [place] [address]

## 2010-10-02 NOTE — Cardiovascular Report (Signed)
[place]. [facility]  Patient:    Vanessa Owens                       MRN: [id number] Proc. Date: 06/11/99 Adm. Date:  [id number] Attending:  Meade Maw A                        Cardiac Catheterization  INDICATIONS FOR PROCEDURE:  Aortic valve area by echocardiogram 0.8 cm. Increased left ventricle mass.  Clinical complaints of chest pain and presyncope.  DESCRIPTION OF PROCEDURE:  After obtaining written, informed consent, The patient was brought to the cardiac catheterization lab in a postabsorptive state. Preobtundation was achieved using IV Versed, IV fentanyl.  The right femoral head was identified using radiographic technique.  A 6-French hemostasis sheath was placed into the right femoral artery by the modified Seldinger technique.  Selective coronary angiography was performed using a JL4 and JR4 Judkins catheters.  Nonionic contrast was used and was hand injected. Single-plane ventriculogram was performed in the RAO position using a 6-French angled pigtail catheter.  Careful pullback was performed.  All catheter exchanges were made over a guide wire.  Following the procedure the hemostasis sheath was removed and hemostasis was achieved using digital pressure.  FINDINGS: 1. Aortic pressure:  108/69. Mean gradient 34.6. 2. Left ventricular pressure:  162/21.  LEFT VENTRICULOGRAM:  Revealed normal wall motion, with ejection fraction 65%.  CORONARY ANGIOGRAPHY: 1. Left Main:  Bifurcated into the left anterior descending and circumflex vessel.    There was no significant disease in the left main coronary artery. 2. Left Anterior Descending:  Gave rise to a moderate D1 ______ ______ ______  branch.  There was no significant disease in the left anterior descending. 3. Circumflex Artery:  Gave rise to a high OM1, which was moderate in size; a small    OM2, small OM3.  There was no significant disease in the circumflex or its     branches. 4. Right Coronary:  Dominant and gave rise to a moderate PDA.  There was no    significant disease.  IMPRESSION: 1. Normal coronary arteries. 2. Critical aortic stenosis.  RECOMMENDATIONS:  Aortic valve replacement.  The patient was discussed with Dr. Kathlee Nations Trigt. DD:  06/11/99 TD:  06/11/99 Job: [id number]

## 2010-10-02 NOTE — H&P (Signed)
[place]. [facility]  Patient:    Vanessa Owens, Vanessa Owens                        MRN: Adm. Date:  08/10/99 Attending:  Mikey Bussing, M.D. Dictator:   Marlowe Kays, P.A. CC:         Meade Maw, M.D.             Dr. Chestine Spore in [facility]             Jamesetta Geralds, M.D., [facility]                         History and Physical  DATE OF BIRTH:  10-26-1960  REFERRING PHYSICIAN:  Meade Maw, M.D.  CHIEF COMPLAINT:  Aortic stenosis.  HISTORY OF PRESENT ILLNESS:  [name] is a 50 year old white mildly obese female referred by Meade Maw, M.D. for evaluation and treatment of symptomatic aortic stenosis. The patient was diagnosed with a cardiac murmur in 1978 and was followed by various physicians and 2-D echoes since then. In January 2001, the patient presented to Meade Maw, M.D. with complaints of atypical chest pain, increased dyspnea on exertion, and presyncopal episodes (no syncope). The symptoms were increasingly worse since 1999. Meade Maw, M.D. found a 4/6 systolic murmur. She ordered a 2-D echo which revealed aortic stenosis with transvalvular gradient of 80 mmHg with AV area of 0.8 sq cm. There was also LVH. There were no other valve deformities. Ejection fraction at that time was 70%. Cardiac catheterization was recommended which she underwent on June 11, 1999, by Meade Maw, M.D. The  catheterization showed a measured ______ peak gradient of 54 mmHg, with a mean transvalvular gradient of 35 and ejection fraction of 75%. No significant CAD. he then was referred to CVTS for AV replacement. The patient discussed all the valve choices with Kerin Perna III, M.D. over a period of three visits. All the questions and patients concerns were answered. The patient agreed with Kerin Perna III, M.D. that St. Jude valve was the best choice, for it offers the least risk for a  redo procedure later in life. She now returns previous to surgery with no significant complaints. She denies any significant anginal symptoms. She does have some shortness of breath and dyspnea on exertion which is not new. She has  occasional palpitations. She denies any productive cough, no fever or chills. No symptoms of TIA, CVA, or remote ______ . No GI bleed. Her cardiac exam is remarkable for a loud 3 to 4/6 systolic murmur with radiation to the neck. The est of her physical exam is unremarkable.  PAST MEDICAL HISTORY: 1. Aortic stenosis. 2. History of stress-related anxiety. 3. Ejection fraction 75%.  PAST SURGICAL HISTORY: 1. Status post C-section 1984, 1992. 2. Status post cardiac catheterization June 11, 1999, Meade Maw, M.D.  CURRENT MEDICATIONS:  Depo-Provera quarterly one injection.  ALLERGIES:  No known drug allergies.  REVIEW OF SYSTEMS:  See HPI and past medical history for significant positives. The patient denies any new episodes of presyncope. She also denies any history of PE or DVT. No dysuria or hematuria. No diabetes. No kidney disease. The rest of the review of systems is negative.  FAMILY HISTORY:  Mother alive with a history of hypertension and one aunt who had a "heart murmur".  SOCIAL HISTORY:  The patient is divorced. 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 The patient denies tobacco intake;  however, her breath and her clothes have strong tobacco odor. She drinks alcohol in social events but moderately.  PHYSICAL EXAMINATION:  GENERAL:  Well-developed, well-nourished 50 year old white female in no acute distress. Alert and oriented x 3. Highly anxious.  VITAL SIGNS:  Blood pressure 120/70, pulse 64, respirations 18.  HEENT:  Head:  Normocephalic, atraumatic. PERRLA. EOMI. Funduscopic exam within  normal limits.  NECK:  Supple. No JVD, bruits, thyromegaly, or  lymphadenopathy.  CHEST:  Symmetrical on inspirations with no wheezes, rhonchi, or rales. No axillary or supraclavicular lymphadenopathy. Respirations non-labored.  CARDIOVASCULAR:  Regular rate and rhythm, 3 to 4/6 systolic murmur which radiates to the neck. This murmur is harsh. There are no rubs or gallops audible.  ABDOMEN:  Soft, nontender. Bowel sounds x 4. No masses palpable or abdominal bruits. No hepatosplenomegaly.  GENITOURINARY:  Deferred.  RECTAL:  Deferred.  EXTREMITIES:  No clubbing, cyanosis, or edema.  SKIN:  Reveals no ulcerations, normal hair pattern, and warm temperature.  PERIPHERAL PULSES:  Carotid 2+ bilaterally without bruits. Femoral 2+ bilaterally without bruits. Popliteal, dorsalis pedis, and posterior tibialis 2+ bilaterally.  NEUROLOGICAL:  Grossly normal. Normal gait. DTRs 2+ bilaterally. Muscle strength 5/5.  ASSESSMENT AND PLAN:  The patient is a 50 year old white female with a history f aortic stenosis who will undergo an aortic valve replacement on August 10, 1999.  Kerin Perna III, M.D. has seen and evaluated this patient prior to the admission and has explained the risks and benefits involving the procedure. The  patient has agreed to continue.DD:  08/07/99 TD:  08/07/99 Job: [id number]

## 2010-10-02 NOTE — Discharge Summary (Signed)
[place]. [facility]  Patient:    Vanessa Owens, Vanessa Owens                      MRN: [id number] Adm. Date:  [id number] Disc. Date: 08/15/99 Attending:  Mikey Bussing Dictator:   Eugenia Pancoast, P.A. CC:         Mikey Bussing, M.D.             Meade Maw, M.D. with [facility]             Lindell Spar. Chestine Spore, M.D. with [facility]             Jamesetta Geralds, M.D. with [facility]                           Discharge Summary  DATE OF BIRTH:  29-Jul-1960.  FINAL DIAGNOSES: 1. Aortic stenosis. 2. Congenital bicuspid valve.  PROCEDURES:  On August 10, 1999, aortic valve replacement with #19 St. Jude mechanical HP valve.  SURGEON:  Mikey Bussing, M.D.  HISTORY OF PRESENT ILLNESS:  This is a 50 year old female mildly obese referred by Dr. Meade Maw for evaluation of treatment of symptomatic aortic stenosis.  The patient was diagnosed with a cardiac murmur in 1978 and was followed by various physicians and 2D echoes since that time.  In January 2001, the patient presented to Dr. Meade Maw with complaints of atypical chest pain, increased dyspnea on exertion, and presyncopal episodes.  The symptoms were increasingly worse since 1999.  On exam, she was noted to have a 4/6 systolic murmur.  She underwent a 2D echo which revealed an aortic stenosis with transvalvular aortic gradient of 88 mmHg.  Aortic valve gradient was 0.8 sq cm.  Because of these findings, she was referred to Dr. Donata Clay for surgical evaluation.  Dr. Donata Clay saw the patient and discussed the surgery.  Risks and benefits were explained.  The patient understood and agreed to surgery.  HOSPITAL COURSE:  The patient was admitted to [facility] August 10, 1999.  At that time, she underwent aortic valve replacement with a #39 St. Jude mechanical HP valve.  During surgery, she was noted to a bicuspid valve. The patient  tolerated the procedure well.  No intraoperative complications occurred.  Postoperatively the patient is satisfactory.  She was extubated on operative day.  She continued to progress in a satisfactory manner after that.  No untoward events occurred during her stay.  She was on Lasix and potassium, and her weight came down satisfactorily.  Her preop weight was 166.  Her weight on the third postoperative day was 172.  The patient was started on Coumadin per protocol, and she continued to do well.  Her diet was advanced as tolerated. She was up and walking without difficulty.  She went through her cardiac rehab phase one satisfactory.  The incision was healing well with no sign of infection.  The patient continued to progress in a satisfactory manner.  Her hospital course went quite well, and she was subsequently prepared for discharge.  DISCHARGE MEDICATIONS: 1. Coumadin 2.5 mg as directed. 2. Tenormin 25 mg q.12h. 3. Childrens aspirin 81 mg q.d. 4. Percocet 5/3.5 one or two p.o. q.4-6h. p.r.n. pain. 5. Lasix 40 mg q.d. times seven days. 6. K-Dur 20 mEq q.d. times seven days.  FOLLOW-UP:  The patient will  follow up with Dr. Fraser Din in two weeks.  She will go to Dr. Faythe Ghee Coumadin clinic beginning Monday.  Dr. Fraser Din will continue to monitor her and dose her Coumadin accordingly.  She will follow up with Dr. Donata Clay on Friday, September 04, 1999, at 10:30 a.m.  The patient was subsequently discharged home on August 15, 1999 in satisfactory stable condition. DD:  08/15/99 TD:  08/15/99 Job: [id number]

## 2010-10-02 NOTE — H&P (Signed)
[name], [name]               ACCOUNT NO.:  [id number]   MEDICAL RECORD NO.:  [id number]          PATIENT TYPE:  EMS   LOCATION:  MAJO                         FACILITY:  [facility]   PHYSICIAN:  Sharlet Salina T. Owens, M.D.DATE OF BIRTH:  01/20/1961   DATE OF ADMISSION:  11/16/2005  DATE OF DISCHARGE:                                HISTORY & PHYSICAL   CHIEF COMPLAINT:  Right lower quadrant abdominal pain.   HISTORY OF PRESENT ILLNESS:  [name] is a 50 year old female who awoke  this morning, now about 12 hours ago, with vague mid abdominal pain.  It was  not severe and she went to work.  At work the pain became more constant, and  migrated toward the right lower quadrant, and became associated with nausea  but no vomiting.  As the pain worsened as she presented to [facility], where  she was evaluated and found to have right lower quadrant tenderness and  elevated white count, and was sent to [facility] emergency room for further  evaluation.  She states the pain is constant.  It is worse with any motion.  It is not severe.  She has nausea, but no vomiting.  She has felt chilled,  but no definite fever.  She says she has had some intermittent brief crampy  lower abdominal pain for about the past year, but nothing of this character  or duration.  No diarrhea or constipation.  No urinary symptoms.   PAST MEDICAL HISTORY:  Surgery is significant for aortic valve replacement,  by Dr. Donata Clay in 2001, for aortic stenosis.  This was a St. Jude  mechanical valve.  She is on chronic anticoagulation with Coumadin, followed  by Dr. Eden Emms.  Heart function is apparently normal by history.  She has  also had C-section x2.  She has history of a retroperitoneal bleed from over-  anticoagulation in 2006.   MEDICATIONS:  Coumadin 7.5 mg daily with the exception of 5 mg on Tuesday  and Saturday.  She also takes Caltrate and birth control pills.   ALLERGIES:  NONE.   SOCIAL HISTORY:  Married.   Employed at [employer].  Does not smoke  cigarettes.  Drinks occasional alcohol.   FAMILY HISTORY:  Noncontributory.   REVIEW OF SYSTEMS:  GENERAL:  Some chills.  No definite fever.  RESPIRATORY:  No shortness breath, cough, wheezing.  CARDIAC:  No chest pain,  palpitations, syncope, leg swelling.  ABDOMEN:  GI as above.  GU:  No  urinary burning or frequency.  EXTREMITIES:  No joint pain or swelling.  HEMATOLOGIC:  Chronic anticoagulation as above.   PHYSICAL EXAMINATION:  GENERAL:  A mildly overweight white female in no  acute stress.  VITAL SIGNS:  Temperature is 98, pulse 88, respirations 18, blood pressure  131/86.  SKIN:  Warm, dry without rash or infection.  HEENT:  No palpable mass or thyromegaly.  Sclerae are nonicteric.  LUNGS:  Clear without wheezing or increased work of breathing.  CARDIAC:  Regular rate and rhythm.  A 2-3/6 systolic murmur.  Crisp valve  click.  Peripheral pulses  intact.  No JVD or edema.  ABDOMEN:  Decreased bowel sounds.  There is well localized right lower  quadrant tenderness with some guarding.  No palpable masses or  hepatosplenomegaly.  EXTREMITIES:  No joint swelling or deformity.  NEUROLOGIC:  Alert, oriented.  Motor and sensory exams grossly normal.   LABORATORY:  White count is elevated at 14.5 thousand, hemoglobin 13.8.  Electrolytes are normal.  Urinalysis is negative.  INR is 2.1, PTT is 37.  LFTs show elevated total bilirubin of 2.5, indirect 2.2, rest of LFTs  normal.   CT scan of the abdomen and pelvis has been obtained which shows evidence of  acute appendicitis without abscess or rupture   ASSESSMENT/PLAN:  A 50 year old female with acute appendicitis.  She has a  mechanical heart valve and is chronically anticoagulated.  The patient will  be admitted and her Coumadin reversed with fresh frozen plasma.  She will be  given broad spectrum antibiotics in the meantime.  As soon as we can get her  INR reversed, we will proceed  with laparoscopic appendectomy.      Vanessa Owens. Owens, M.D.  Electronically Signed     BTH/MEDQ  D:  11/16/2005  T:  11/16/2005  Job:  [id number]

## 2010-10-02 NOTE — Op Note (Signed)
NAMEMEIGAN, PATES               ACCOUNT NO.:  [id number]   MEDICAL RECORD NO.:  [id number]          PATIENT TYPE:  INP   LOCATION:  5727                         FACILITY:  [facility]   PHYSICIAN:  Wilmon Arms. Corliss Skains, M.D. DATE OF BIRTH:  14-Dec-1960   DATE OF PROCEDURE:  11/17/2005  DATE OF DISCHARGE:                                 OPERATIVE REPORT   PREOPERATIVE DIAGNOSIS:  Acute appendicitis.   POSTOPERATIVE DIAGNOSIS:  Acute appendicitis.   PROCEDURE PERFORMED:  Laparoscopic appendectomy.   SURGEON:  Wilmon Arms. Tsuei, M.D.   ANESTHESIA:  General endotracheal.   INDICATIONS:  The patient is a 50 year old female with a past medical  history of an aortic valve replacement in 2001 for which she is chronically  anticoagulated.  She presented with a 12-hour history of abdominal pain  migrating to her right lower quadrant.  The pain was constant and associated  with some nausea.  She was evaluated in emergency department and was noted  to be afebrile but had tenderness in right lower quadrant.  Her white count  was 14.5.  She underwent a CT scan which showed findings consistent with  appendicitis.  She was evaluated by Dr. Johna Sheriff who admitted her to the  hospital.  At that time her INR was elevated at 2.1.  She has been treated  with 4 units of fresh frozen plasma and her preoperative INR is now 1.5.   DESCRIPTION OF PROCEDURE:  The patient was brought to the operating room and  placed supine position on the operating table.  After adequate level of  general endotracheal anesthesia was obtained, a Foley catheter was placed  under sterile technique.  Her abdomen was then prepped with Betadine and  draped in sterile fashion.  A time-out was taken to assure proper patient,  proper procedure.  The area just above her umbilicus was infiltrated with  0.25% Marcaine.  A transverse incision was made here and dissection was  carried down to the fascia.  The fascia was opened vertically and  the  peritoneal cavity was bluntly entered.  A pursestring sutures of 0 Vicryl  was placed around the fascial opening.  The Hasson cannula was inserted and  secured with a stay suture.  Pneumoperitoneum is obtained by insufflating  CO2 and maintaining maximal pressure of 15 mmHg.  The laparoscope was  inserted and no gross purulence was noted in the right lower quadrant.  There were two small omental adhesions in the lower midline.  A 5 mm port  was placed in the left lower quadrant and a 10 mm port in the right upper  quadrant.  The harmonic scalpel was used take down to omental adhesions.  The patient was then rotated to her left in Trendelenburg position.  The  cecum was identified and was grasped with Babcock clamp and rotated  medially.  A moderately inflamed appendix was identified.  This was grasped  with the Babcock clamp and elevated.  The harmonic scalpel used to take down  the mesoappendix to the base of the appendix.  The base of the appendix was  then  divided with Endo-GIA stapler.  The appendix was then removed through  the umbilical port site.  No perforation was noted.  No gross purulence was  seen.  The right lower quadrant was then irrigated.  There was a small  amount of oozing from the staple line.  This area was cauterized with a  spatula cautery.  Hemostasis was felt to be adequate.  The right lower  quadrant was then thoroughly irrigated with 900 mL saline.  No other  abnormalities were noted in the right lower quadrant.  The irrigant was  suctioned out.  The port sites were removed under direct vision as  pneumoperitoneum was released.  No bleeding was noted from the port sites.  The pursestring suture was  tied down to close the umbilical fascia.  4-0 Monocryl as then used to close  the skin in subcuticular fashion.  Steri-Strips and clean dressings applied.  The patient was extubated and brought to recovery in stable condition.  All  sponge, instrument counts  correct.      Wilmon Arms. Tsuei, M.D.  Electronically Signed     MKT/MEDQ  D:  11/17/2005  T:  11/17/2005  Job:  [id number]

## 2010-10-02 NOTE — Consult Note (Signed)
NAMEKARLIE, AUNG NO.:  [id number]   MEDICAL RECORD NO.:  [id number]          PATIENT TYPE:  INP   LOCATION:  5727                         FACILITY:  [facility]   PHYSICIAN:  Willa Rough, M.D.     DATE OF BIRTH:  [birth year]   DATE OF CONSULTATION:  11/18/2005  DATE OF DISCHARGE:                                   CONSULTATION   Vanessa Owens is currently admitted to [facility] with appendicitis.  She  had laparoscopic appendectomy.  She has had a drop in her hemoglobin and may  have some bleeding.  This is being fully assessed.   The patient is on Coumadin.  She is status post aortic valve replacement  with a St. Jude mechanical prosthesis in aortic position in 2001.  Her INR  yesterday was in the range of 1.5-1.8 when her surgery had to be done.  She  has not received any vitamin K as far as I can tell.  She has received from  [facility].  PT (INR) is not yet available for today.  The patient is not having  any significant chest pain or shortness of breath.   We are seeing her to help make recommendations concerning her valve and  anticoagulation.   PAST MEDICAL HISTORY:   ALLERGIES:  There are no known significant allergies.   MEDICATIONS PRIOR TO ADMISSION:  1.  Coumadin 7.5 mg daily except 5 mg on Tuesday and Saturday.  2.  Calcium.  3.  Birth control pills.   SOCIAL HISTORY:  The patient is married and works with [employer].  The patient does not smoke.   FAMILY HISTORY:  The family history is noncontributory to the current  medical illness.   REVIEW OF SYSTEMS:  In the room today the patient is tearful.  She has had a  discomfort in her abdomen over the night.  Otherwise, her review of systems  is negative.   PHYSICAL EXAMINATION:  VITAL SIGNS:  Blood pressure is 100/68 with a pulse  of 82.  Her temperature is 99.1.  GENERAL:  The patient is tearful.  She is oriented to person, time, and  place.  LUNGS:  Clear.  Respiratory effort is not  labored.  HEENT:  No xanthelasma.  She has normal extraocular motion.  There are no  carotid bruits.  There is no jugular venous distention.  CARDIAC:  S1 with an S2.  The aortic valve closure sound is crisp.  No  obvious aortic insufficiency is heard.  ABDOMEN:  Only briefly examined as this is being followed carefully by  surgery.  EXTREMITIES:  She has no significant peripheral edema.  She has no  significant musculoskeletal deformities.   LABORATORY DATA:  Her chest x-ray reveals no significant abnormalities.  Her  EKG shows some mild nonspecific ST changes.  She is in sinus rhythm.  BUN is  6 with creatinine 0.8 and potassium 3.6.  Her hemoglobin has dropped from  10.4 to 8.4 and follow-up laboratories are pending at this time along with  follow-up INR.  As mentioned above her INR yesterday  was in the range of 1.5-  1.8.  She has received some FFP.   PROBLEMS:  1.  Status post St. Jude mechanical aortic valve replacement in 2001.  This      is working well.  2.  Coumadin therapy.  Her Coumadin is currently on hold.  3.  Drop in hemoglobin.   The patient's overall cardiac status is stable.  I certainly agree with all  the plans to hold her Coumadin and treat her hemoglobin as needed.  Hopefully vitamin K will not be needed.  However, it can be used if  necessary.  The patient will need to have her Coumadin restarted as soon as  getting safe from the surgical viewpoint.  However, with her valve in the  aortic position it should be safe for her to be off her Coumadin for several  days as she recovers from her current surgery.  We will follow carefully and  decide along with the surgeons if and when heparin can be restarted as her  Coumadin is restarted before she is fully recoumadinized.           ______________________________  Willa Rough, M.D.     JK/MEDQ  D:  11/18/2005  T:  11/18/2005  Job:  [id number]   cc:   Charlton Haws, M.D.  [address]  [address]   [place]  [place] [address]

## 2010-10-02 NOTE — Assessment & Plan Note (Signed)
[facility]                              CARDIOLOGY OFFICE NOTE   NAME:Vanessa Owens, Vanessa Owens                      MRN:          [id number]  DATE:03/22/2006                            DOB:          [birth year]    Vanessa Owens returns today in follow up.  She is status post aortic valve  replacement.  She used to see Dr. Meade Maw.   She has had multiple problems lately.  She has not been exercising and  taking good care of herself.  She seems to be under a little bit of stress.  However, she has been having increasing headaches.  They are not like her  migraines which she has had before.  They can be constant and can last for  days.   There is no associated focal neurological signs with them.  There is no  nausea, vomiting or photophobia.   In addition to this, she has been having chest pains, they tend to be  nonexertional, they are a bit atypical, they are centered on the left side,  however, and can travel over to her left axilla.   She has also been complaining of some fatigue.  She is status post aortic  valve replacement.  She did not have concomitant coronary disease at the  time.   REVIEW OF SYSTEMS:  She recently had a somewhat complicated appendectomy by  Dr. Jamey Ripa on July 3rd.  She had a bit of bleeding despite her INR being  1.5, and I believe had a rectal sheath hematoma.   This was not a pleasant hospitalization for her.   However, she has done okay since discharge.  Her Coumadin levels were  followed here.  They have been good.  She has not had any subtherapeutic  levels.  Aside from her Coumadin, she only takes vitamins and birth control  pills.   She used to see Dr. Ruthine Dose and needs to be referred to [facility]  office.  I will see if I can get her in to see Dr. Laury Axon.   EXAM:  She looks well.  HEENT:  Normal.  SKIN:  Warm and dry.  There is no lymphadenopathy, there is no thyromegaly.  LUNG FIELDS:  Clear.  There no  S1, S2, with an S2 click and a soft systolic  murmur.  There is no aortic insufficiency.  ABDOMEN:  Benign.  LOWER EXTREMITIES:  Intact pulses, no edema.  NEUROLOGICAL EXAM:  Nonfocal.   EKG is normal.   IMPRESSION:  Vanessa Owens's heart valve sounds normal; however, she is having  chest pain symptoms.  I think it is reasonable to do a 2D echocardiogram.  She has not had one in 2-1/2 years and I would like to reassess her LV  function to make sure there is no pannus formation around the valve.   Similarly, I think it is reasonable to do a stress Myoview since the patient  already has valvular heart disease and is having chest pain.  It would be  reasonable to do a stress Myoview to rule out coronary disease.   The patient will  have a non-contrast head CT to rule out possible chronic  subdural or other structural abnormalities.  She is having a lot of  headaches and has a history of migraines.   We will have her followup with Dr. Laury Axon in regards to her general medical  care.  I will see her back in 6 months so long as her tests are normal.    ______________________________  Noralyn Pick. Eden Emms, MD, [facility]    PCN/MedQ  DD: 03/22/2006  DT: 03/22/2006  Job #: [id number]

## 2010-10-02 NOTE — Discharge Summary (Signed)
Vanessa Owens, Vanessa Owens               ACCOUNT NO.:  [id number]   MEDICAL RECORD NO.:  [id number]          PATIENT TYPE:  INP   LOCATION:  3037                         FACILITY:  [facility]   PHYSICIAN:  Stacie Glaze, M.D. LHCDATE OF BIRTH:  1961-01-22   DATE OF ADMISSION:  03/31/2005  DATE OF DISCHARGE:  04/04/2005                                 DISCHARGE SUMMARY   ADMITTING DIAGNOSES:  1.  Hematuria.  2.  Iatrogenic anticoagulation.   DISCHARGE DIAGNOSES:  1.  Hematuria.  2.  Possible intrarenal bleed with clot.   HOSPITAL COURSE:  The patient is a 50 year old white female with a five-day  history of gross hematuria, right flank pain, and passage of blood clots.  She denied any fever.  She was seen at an urgent care center and put on  Cipro 500 mg twice a day.  She continued to have problems and presented to  the emergency room and was admitted.  In evaluation of her presentation in  the emergency room, she was found to have an INR of 11.9 and gross hematuria  with a stable hemoglobin of 13.5. She did complain of some chills, however,  it was decided since her white count was normal that the antibiotics would  not be initiated.  Her coagulopathy was corrected with vitamin K and fresh  frozen plasma administered in the ER.  She was carefully monitored due to  the presence of a St. Jude aortic valve.   Urology consultation was obtained from Dr. Vonita Moss who followed the patient  throughout the hospitalization.  On the 19th, her urine was visibly clear of  blood, however, continued to be positive for microscopic hematuria and she  had no symptomatology of flank pain and fever.  She was discharged to home  after being given a prophylactic dose of Rocephin and she was placed on  Coumadin 7.5 mg a day.  She was instructed to continue to observe her urine,  to present in Coumadin clinic for evaluation of her anticoagulation.  She  had been on 7.5 mg a day previously to this with her INR  going to 11, so it  would be recommended to have close evaluation of her Coumadin usage through  Coumadin clinic for the next several weeks.  During this hospitalization it  should be noted that she was given 2 mg of vitamin K on November 16 and an  additional 2 mg of vitamin K on November 17.           ______________________________  Stacie Glaze, M.D. [facility]     JEJ/MEDQ  D:  04/04/2005  T:  04/05/2005  Job:  [id number]   cc:   COUMADIN CLINIC  [facility]   [place]  [facility]

## 2010-10-02 NOTE — Discharge Summary (Signed)
[name], Owens               ACCOUNT NO.:  [id number]   MEDICAL RECORD NO.:  [id number]          PATIENT TYPE:  INP   LOCATION:  5727                         FACILITY:  [facility]   PHYSICIAN:  Currie Paris, M.D.DATE OF BIRTH:  03/10/1961   DATE OF ADMISSION:  11/16/2005  DATE OF DISCHARGE:  11/20/2005                                 DISCHARGE SUMMARY   CONSULTATIONS:  Willa Rough, M.D. with cardiology.   CHIEF COMPLAINT AND REASON FOR ADMISSION:  Vanessa Owens is a 50 year old female  patient.  History of aortic valve replacement in 2001 followed by Dr.  Eden Emms.  She presented with 12 hours of midabdominal pain, now localized to  the right lower quadrant.  This pain is constant and has been associated  with nausea in the ER.  She was afebrile.  On exam, she was tender in the  right lower quadrant with guarding.  CT was done that did demonstrate  appendicitis without apparent abscess formation or perforation.  White count  was 14,500.  She was fully anticoagulated and her INR was 2.1.  It was  deemed that the patient would need to be pursued with laparoscopic  appendectomy, so the Coumadin was reversed with FFT and plans were to  precede as soon as possible with laparoscopic appendectomy.   ADMISSION DIAGNOSIS:  Acute appendicitis.   HOSPITAL COURSE:  Patient was taken directly from the ER to the OR by Dr.  Corliss Skains to procedure with laparoscopic appendectomy.  INR prior to procedure  was 1.9.  She was given an additional 2 units of FFT to get the INR less  than 1.5 and once the INR was less than 1.5, the patient was taken to the OR  by Dr. Corliss Skains where she did under go a laparoscopic appendectomy without any  immediate intraoperative complications.  She was extubated and brought the  recovery room in stable condition.  Later that night, on the operative day,  Dr. Corliss Skains was called to see the patient because of increasing pain in the  left lower quadrant.  This had been going on  around 6 o'clock, he was  notified at 10:30.  She had developed increased pain, swelling around her  left lower quadrant port site.  She was afebrile.  Her systolic blood  pressure was in the 90s on exam.  This incision showed no sign of oozing or  bruising, but there was a large, firm, tender mass under the incision.  Dr.  Corliss Skains felt this represented a rectus sheath hematoma secondary to  anticoagulation, this despite receiving aggressive Coumadin reversal agents  with FFT prior to procedure.  Dr. Corliss Skains felt that the area may tamponade  itself off, but the patient was left n.p.o. and orders were to repeat stat  CBC and PT in the event patient would need to return to surgery.  Patient  was informed of this.  Important note that the previous right lower quadrant  pain has resolved post appendectomy.   On postop day #1,  patient received and addition unit of FFP during the  night.  Her blood pressure was  stable.  She was still tender over the left  lower quadrant at the hematoma site and was going well except for pain and  anxiety.  She was quite fearful she would need to return to surgery because  of the bleeding.  She was very anxious and tearful complaining of chest  pressure.  Patient was reassured that this area was stable and that we were  performing a CT scan of the abdomen as precaution to determine if more than  just the skin area i.e. simple hematoma was the case.  Ativan was given to  assist with patient's anxiety issues.   Because of her history of mechanical AVR, cardiology was consulted.  The  recommendation was that, because of the patient's age and stability, that  she could be off any anticoagulation for 2 weeks to allow the hematoma to  resolve.   Because of the chest pain, an EKG was ordered.  It showed sinus rhythm  without ischemia.  Cardiac isoenzymes were checked as a precaution.  At this  point, CT scan was finalized and these results were discussed with the   patient that included no evidence of bleeding internally and only hematoma  formation just directly beneath the trocar insertion sight at the incision.  Patient's family and husband were also in the room.   By postop day #2, patient was stable.  Her hemoglobin was at 0.8, her INR  was 1.7.  Dr. Derrell Lolling had given her an additional FFT the day before and  plans were, at this time, to continue following her coagulation status and  watching the hematoma site.  Her cardiac isoenzymes were normal from the  previous episode of chest pain.   During the early morning hours of November 19, 2005, patient developed chest  pressure and difficulty breathing and oxygen desaturation, respiratory rate  32, sat 87% on 2 liters of O2.  Chest x-ray was performed and this was  consistent with acute pulmonary edema.  Patient was given nitroglycerine  sublingual and O2.  Her oxygen was later increased to 100% non-rebreather  and she was breathing fine at that point.  It was suspected that a  combination of IV fluids and recent use of FFT for Coumadin reversal was the  cause of her pulmonary edema i.e. volume overload.  Her BNP was 347.  She  was given low-dose IV Lasix, diuresed well, and by the afternoon on November 19, 2005, which was postop day #2, her respiratory status had improved to the  point to where she was not requiring any further oxygen.  Her prior anxiety  symptoms over the past 24 hours had also resolved as well.  Her diet was  advanced and she was doing quite well.  By postop day #3, she continued  without any episodes of dyspnea, no nausea, she was tolerating a diet,  minimal pain, and 9 hours check prior to discharge and from a surgical as  well as cardiovascular standpoint, she was deemed appropriate for discharge  home.   DISCHARGE DIAGNOSIS:  1.  Laparoscopic appendectomy secondary to nonperforated appendix. 2.  Rectus muscle hematoma at left lower quadrant trocar site, stable      without  evidence of additional internal bleeding.  3.  Volume overload and respiratory failure causing pulmonary edema due to      FFT infusion.  4.  History of St. Jude aortic valve.  5.  Systemic anticoagulation followed by cardiology, outpatient.   DISCHARGE MEDICATIONS:  1.  Caltrate plus D 600 b.i.d.  2.  Birth control pills as previous.  3.  Coumadin 7.5 mg once daily except on Tuesdays and Saturdays.  She will      take 5 mg on those days.   DIET RESTRICTIONS:  None.   Return to work once she follows up with the Careers adviser.   ACTIVITY:  No driving for 1 week.  No lifting for 2 weeks.   PAIN MANAGEMENT:  Tylox as needed.   FOLLOWUP:  She will need to follow up with Dr. Derrell Lolling and Dr. Corliss Skains call the  office to arrange an appointment in 2 weeks.  Needs to have her INR checked  with [facility].  After taking the Coumadin for 5 days, please  contact me for a lab appointment.      Currie Paris, M.D.  Electronically Signed     CJS/MEDQ  D:  12/09/2005  T:  12/10/2005  Job:  [id number]   cc:   [place] Arms. Corliss Skains, M.D.  [address] [place] [address] [id number]  [place] M. Derrell Lolling, M.D.  [address]  [place]  [place] [address]

## 2010-10-08 ENCOUNTER — Ambulatory Visit (INDEPENDENT_AMBULATORY_CARE_PROVIDER_SITE_OTHER): Payer: Managed Care, Other (non HMO) | Admitting: *Deleted

## 2010-10-08 ENCOUNTER — Ambulatory Visit
Admission: RE | Admit: 2010-10-08 | Discharge: 2010-10-08 | Disposition: A | Discharge status: Home / Self Care | Payer: Managed Care, Other (non HMO) | Source: Ambulatory Visit | Attending: Obstetrics & Gynecology | Admitting: Obstetrics & Gynecology

## 2010-10-08 DIAGNOSIS — I359 Nonrheumatic aortic valve disorder, unspecified: Secondary | ICD-10-CM

## 2010-10-08 DIAGNOSIS — Z1231 Encounter for screening mammogram for malignant neoplasm of breast: Secondary | ICD-10-CM

## 2010-10-08 DIAGNOSIS — Z954 Presence of other heart-valve replacement: Secondary | ICD-10-CM

## 2010-10-08 DIAGNOSIS — Z7901 Long term (current) use of anticoagulants: Secondary | ICD-10-CM

## 2010-10-08 LAB — POCT INR: INR: 4.4

## 2010-10-10 ENCOUNTER — Encounter: Payer: Self-pay | Admitting: Family Medicine

## 2010-10-10 ENCOUNTER — Inpatient Hospital Stay (INDEPENDENT_AMBULATORY_CARE_PROVIDER_SITE_OTHER)
Admission: RE | Admit: 2010-10-10 | Discharge: 2010-10-10 | Disposition: A | Discharge status: Home / Self Care | Payer: Managed Care, Other (non HMO) | Source: Ambulatory Visit | Attending: Family Medicine | Admitting: Family Medicine

## 2010-10-10 DIAGNOSIS — L255 Unspecified contact dermatitis due to plants, except food: Secondary | ICD-10-CM

## 2010-10-30 ENCOUNTER — Other Ambulatory Visit (HOSPITAL_COMMUNITY): Payer: Self-pay | Admitting: Cardiovascular Disease

## 2010-10-30 ENCOUNTER — Ambulatory Visit (INDEPENDENT_AMBULATORY_CARE_PROVIDER_SITE_OTHER): Payer: Managed Care, Other (non HMO) | Admitting: *Deleted

## 2010-10-30 ENCOUNTER — Ambulatory Visit (HOSPITAL_COMMUNITY): Payer: Managed Care, Other (non HMO) | Attending: Cardiovascular Disease | Admitting: Radiology

## 2010-10-30 DIAGNOSIS — I359 Nonrheumatic aortic valve disorder, unspecified: Secondary | ICD-10-CM

## 2010-10-30 DIAGNOSIS — I079 Rheumatic tricuspid valve disease, unspecified: Secondary | ICD-10-CM | POA: Insufficient documentation

## 2010-10-30 DIAGNOSIS — Z954 Presence of other heart-valve replacement: Secondary | ICD-10-CM

## 2010-10-30 DIAGNOSIS — Z7901 Long term (current) use of anticoagulants: Secondary | ICD-10-CM

## 2010-11-27 ENCOUNTER — Ambulatory Visit (INDEPENDENT_AMBULATORY_CARE_PROVIDER_SITE_OTHER): Payer: Managed Care, Other (non HMO) | Admitting: *Deleted

## 2010-11-27 DIAGNOSIS — I359 Nonrheumatic aortic valve disorder, unspecified: Secondary | ICD-10-CM

## 2010-11-27 DIAGNOSIS — Z954 Presence of other heart-valve replacement: Secondary | ICD-10-CM

## 2010-11-27 DIAGNOSIS — Z7901 Long term (current) use of anticoagulants: Secondary | ICD-10-CM

## 2010-11-27 LAB — POCT INR: INR: 4.1

## 2010-12-18 ENCOUNTER — Encounter: Payer: Managed Care, Other (non HMO) | Admitting: *Deleted

## 2010-12-23 ENCOUNTER — Ambulatory Visit (INDEPENDENT_AMBULATORY_CARE_PROVIDER_SITE_OTHER): Payer: Managed Care, Other (non HMO) | Admitting: *Deleted

## 2010-12-23 DIAGNOSIS — Z7901 Long term (current) use of anticoagulants: Secondary | ICD-10-CM

## 2010-12-23 DIAGNOSIS — I359 Nonrheumatic aortic valve disorder, unspecified: Secondary | ICD-10-CM

## 2010-12-23 DIAGNOSIS — Z954 Presence of other heart-valve replacement: Secondary | ICD-10-CM

## 2010-12-23 LAB — POCT INR: INR: 2.7

## 2011-01-20 ENCOUNTER — Ambulatory Visit (INDEPENDENT_AMBULATORY_CARE_PROVIDER_SITE_OTHER): Payer: Managed Care, Other (non HMO) | Admitting: *Deleted

## 2011-01-20 DIAGNOSIS — Z954 Presence of other heart-valve replacement: Secondary | ICD-10-CM

## 2011-01-20 DIAGNOSIS — I359 Nonrheumatic aortic valve disorder, unspecified: Secondary | ICD-10-CM

## 2011-01-20 DIAGNOSIS — Z7901 Long term (current) use of anticoagulants: Secondary | ICD-10-CM

## 2011-02-15 ENCOUNTER — Telehealth: Payer: Self-pay | Admitting: *Deleted

## 2011-02-15 NOTE — Telephone Encounter (Signed)
Can she have laser hair removal safety.  She wants to make sure since she is on coumadin.  Please call her back.

## 2011-02-17 NOTE — Telephone Encounter (Signed)
Spoke with pt.  Told her laser hair removal should be okay since it is non-invasive.  She has discussed this with the PA at the office doing the procedure and got the same response.

## 2011-02-18 ENCOUNTER — Encounter: Payer: Managed Care, Other (non HMO) | Admitting: *Deleted

## 2011-02-24 ENCOUNTER — Ambulatory Visit (INDEPENDENT_AMBULATORY_CARE_PROVIDER_SITE_OTHER): Payer: Managed Care, Other (non HMO) | Admitting: *Deleted

## 2011-02-24 DIAGNOSIS — I359 Nonrheumatic aortic valve disorder, unspecified: Secondary | ICD-10-CM

## 2011-02-24 DIAGNOSIS — Z7901 Long term (current) use of anticoagulants: Secondary | ICD-10-CM

## 2011-02-24 DIAGNOSIS — Z954 Presence of other heart-valve replacement: Secondary | ICD-10-CM

## 2011-02-24 LAB — POCT INR: INR: 3.2

## 2011-02-24 MED ORDER — WARFARIN SODIUM 7.5 MG PO TABS: ORAL_TABLET | ORAL | Status: DC

## 2011-03-24 ENCOUNTER — Encounter: Payer: Managed Care, Other (non HMO) | Admitting: *Deleted

## 2011-03-26 ENCOUNTER — Encounter: Payer: Managed Care, Other (non HMO) | Admitting: *Deleted

## 2011-03-31 ENCOUNTER — Ambulatory Visit (INDEPENDENT_AMBULATORY_CARE_PROVIDER_SITE_OTHER): Payer: Managed Care, Other (non HMO) | Admitting: *Deleted

## 2011-03-31 DIAGNOSIS — Z954 Presence of other heart-valve replacement: Secondary | ICD-10-CM

## 2011-03-31 DIAGNOSIS — Z7901 Long term (current) use of anticoagulants: Secondary | ICD-10-CM

## 2011-03-31 DIAGNOSIS — I359 Nonrheumatic aortic valve disorder, unspecified: Secondary | ICD-10-CM

## 2011-03-31 LAB — POCT INR: INR: 2.5

## 2011-04-19 NOTE — Progress Notes (Signed)
Summary: poison oak/TM (room 4)   Vital Signs:  Patient Profile:   50 Years Old Female CC:      rash on both arms since 5-22 Height:     61 inches Weight:      141 pounds O2 Sat:      99 % O2 treatment:    Room Air Temp:     98.6 degrees F oral Resp:     16 per minute BP sitting:   115 / 74  (left arm) Cuff size:   regular  Vitals Entered By: Lavell Islam RN (Oct 10, 2010 10:45 AM)                  Updated Prior Medication List: * ERIN BCP  * CALTRATE 500MG  1 tab by mouth once daily COUMADIN 7.5 MG TABS (WARFARIN SODIUM) Take as directed by coumadin clinic. [BMN]  Current Allergies (reviewed today): ! MORPHINEHistory of Present Illness Chief Complaint: rash on both arms since 5-22 History of Present Illness:  Subjective:  Patient complains of onset of pruritic vesicular rash on arms four days ago after working in garden.  Lesions somewhat improved with Caladryl. She notes that she takes Coumadin, and last INR two days ago was 4.4.  Her PCP asked her to hold her medication unti INR decreases.  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 Mother with hypertension  Social History: Reviewed history from 10/03/2008 and no changes required. 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  No swelling or tenderness. Assessment New Problems: RHUS DERMATITIS (ICD-692.6)  WITH AN ELEVATED INR FROM COUMADIN, WILL AVOID ORAL OR INJECTABLE STEROIDS  Plan New Medications/Changes: TRIAMCINOLONE ACETONIDE 0.1 % CREA (TRIAMCINOLONE ACETONIDE) Apply thin layer to affected area two times a day to three times a day  #45gm x 0, 10/10/2010, Donna Christen MD  New Orders: Est. Patient Level III [phone] Services provided After hours-Weekends-Holidays [99051] Planning Comments:   Begin triamcinolone cream two times a day to three times a day.  Continue Caladryl lotion. Follow-up with PCP for repeat INR.   Return for increasing pain, swelling, etc.   The patient and/or caregiver has been counseled thoroughly with regard to medications prescribed including dosage, schedule, interactions, rationale for use, and possible side effects and they verbalize understanding.  Diagnoses and expected course of recovery discussed and will return if not improved as expected or if the condition worsens. Patient and/or caregiver verbalized understanding.  Prescriptions: TRIAMCINOLONE ACETONIDE 0.1 % CREA (TRIAMCINOLONE ACETONIDE) Apply thin layer to affected area two times a day to three times a day  #45gm x 0  Entered and Authorized by:   Donna Christen MD   Signed by:   Donna Christen MD on 10/10/2010   Method used:   Print then Give to Patient   RxID:   [id number]   Orders Added: 1)  Est. Patient Level III [14782] 2)  Services provided After hours-Weekends-Holidays [[id number]]

## 2011-05-05 ENCOUNTER — Ambulatory Visit (INDEPENDENT_AMBULATORY_CARE_PROVIDER_SITE_OTHER): Payer: Managed Care, Other (non HMO) | Admitting: *Deleted

## 2011-05-05 DIAGNOSIS — Z954 Presence of other heart-valve replacement: Secondary | ICD-10-CM

## 2011-05-05 DIAGNOSIS — I359 Nonrheumatic aortic valve disorder, unspecified: Secondary | ICD-10-CM

## 2011-05-05 DIAGNOSIS — Z7901 Long term (current) use of anticoagulants: Secondary | ICD-10-CM

## 2011-06-02 ENCOUNTER — Encounter: Payer: Managed Care, Other (non HMO) | Admitting: *Deleted

## 2011-06-04 ENCOUNTER — Encounter: Payer: Managed Care, Other (non HMO) | Admitting: *Deleted

## 2011-06-07 ENCOUNTER — Ambulatory Visit (INDEPENDENT_AMBULATORY_CARE_PROVIDER_SITE_OTHER): Payer: Managed Care, Other (non HMO) | Admitting: *Deleted

## 2011-06-07 DIAGNOSIS — I359 Nonrheumatic aortic valve disorder, unspecified: Secondary | ICD-10-CM

## 2011-06-07 DIAGNOSIS — Z7901 Long term (current) use of anticoagulants: Secondary | ICD-10-CM

## 2011-06-07 DIAGNOSIS — Z954 Presence of other heart-valve replacement: Secondary | ICD-10-CM

## 2011-06-29 ENCOUNTER — Ambulatory Visit (INDEPENDENT_AMBULATORY_CARE_PROVIDER_SITE_OTHER): Payer: Managed Care, Other (non HMO) | Admitting: *Deleted

## 2011-06-29 DIAGNOSIS — Z7901 Long term (current) use of anticoagulants: Secondary | ICD-10-CM

## 2011-06-29 DIAGNOSIS — I359 Nonrheumatic aortic valve disorder, unspecified: Secondary | ICD-10-CM

## 2011-06-29 DIAGNOSIS — Z954 Presence of other heart-valve replacement: Secondary | ICD-10-CM

## 2011-07-27 ENCOUNTER — Ambulatory Visit (INDEPENDENT_AMBULATORY_CARE_PROVIDER_SITE_OTHER): Payer: Managed Care, Other (non HMO) | Admitting: *Deleted

## 2011-07-27 DIAGNOSIS — Z954 Presence of other heart-valve replacement: Secondary | ICD-10-CM

## 2011-07-27 DIAGNOSIS — Z7901 Long term (current) use of anticoagulants: Secondary | ICD-10-CM

## 2011-07-27 DIAGNOSIS — I359 Nonrheumatic aortic valve disorder, unspecified: Secondary | ICD-10-CM

## 2011-08-11 ENCOUNTER — Ambulatory Visit (INDEPENDENT_AMBULATORY_CARE_PROVIDER_SITE_OTHER): Payer: Managed Care, Other (non HMO) | Admitting: Pharmacist

## 2011-08-11 DIAGNOSIS — Z954 Presence of other heart-valve replacement: Secondary | ICD-10-CM

## 2011-08-11 DIAGNOSIS — I359 Nonrheumatic aortic valve disorder, unspecified: Secondary | ICD-10-CM

## 2011-08-11 DIAGNOSIS — Z7901 Long term (current) use of anticoagulants: Secondary | ICD-10-CM

## 2011-09-01 ENCOUNTER — Other Ambulatory Visit: Payer: Self-pay | Admitting: Pharmacist

## 2011-09-01 MED ORDER — WARFARIN SODIUM 7.5 MG PO TABS: ORAL_TABLET | ORAL | Status: DC

## 2011-09-03 ENCOUNTER — Ambulatory Visit (INDEPENDENT_AMBULATORY_CARE_PROVIDER_SITE_OTHER): Payer: Managed Care, Other (non HMO) | Admitting: *Deleted

## 2011-09-03 DIAGNOSIS — Z7901 Long term (current) use of anticoagulants: Secondary | ICD-10-CM

## 2011-09-03 DIAGNOSIS — I359 Nonrheumatic aortic valve disorder, unspecified: Secondary | ICD-10-CM

## 2011-09-03 DIAGNOSIS — Z954 Presence of other heart-valve replacement: Secondary | ICD-10-CM

## 2011-09-06 ENCOUNTER — Other Ambulatory Visit: Payer: Self-pay | Admitting: Cardiovascular Disease

## 2011-09-06 MED ORDER — WARFARIN SODIUM 7.5 MG PO TABS: ORAL_TABLET | ORAL | Status: DC

## 2011-09-06 NOTE — Telephone Encounter (Signed)
New Problem:     Patient called in needing a refill of her warfarin (COUMADIN) 7.5 MG tablet.

## 2011-09-06 NOTE — Telephone Encounter (Signed)
Pt wanted rx sent to [facility] order company.  Rx sent on 09/01/11 states it was printed not faxed.  Advised pt I would resend now.  Pt aware.

## 2011-09-23 ENCOUNTER — Encounter: Payer: Self-pay | Admitting: *Deleted

## 2011-09-24 ENCOUNTER — Ambulatory Visit (INDEPENDENT_AMBULATORY_CARE_PROVIDER_SITE_OTHER): Payer: Managed Care, Other (non HMO) | Admitting: *Deleted

## 2011-09-24 ENCOUNTER — Encounter: Payer: Self-pay | Admitting: Cardiovascular Disease

## 2011-09-24 ENCOUNTER — Ambulatory Visit (INDEPENDENT_AMBULATORY_CARE_PROVIDER_SITE_OTHER): Payer: Managed Care, Other (non HMO) | Admitting: Cardiovascular Disease

## 2011-09-24 VITALS — BP 123/79 | HR 67 | Ht 61.0 in | Wt 143.0 lb

## 2011-09-24 DIAGNOSIS — Z952 Presence of prosthetic heart valve: Secondary | ICD-10-CM

## 2011-09-24 DIAGNOSIS — I359 Nonrheumatic aortic valve disorder, unspecified: Secondary | ICD-10-CM

## 2011-09-24 DIAGNOSIS — Z954 Presence of other heart-valve replacement: Secondary | ICD-10-CM

## 2011-09-24 DIAGNOSIS — Z7901 Long term (current) use of anticoagulants: Secondary | ICD-10-CM

## 2011-09-24 NOTE — Progress Notes (Signed)
Patient ID: Vanessa Owens, female   DOB: [birth year], 51 y.o.   MRN: [id number] Vanessa Owens returns today for F/U post AVR in 2001 for bicuspid valve. Previously followed by Dr Vanessa Owens. Last echo reviewed from 09/29/10 with mild LVH, normal EF, mean gradient across AVR 39mmHg/peak and mild AR. Needs a root canal.  SBE prophylaxis discussed.  Complained about facility fee with last echo.  May try to have it at [facility] next year.    Echo 09/29/10 Study Conclusions  - Left ventricle: The cavity size was normal. Wall thickness was increased in a pattern of mild LVH. The estimated ejection fraction was 65%. Wall motion was normal; there were no regional wall motion abnormalities. - Aortic valve: By history there is a St. Jude mechanical aortic valve. The gradients now are higher than 2010. It seems to me that we may not be seeing a crisp image of the disc motion. Iam not able to access the prior echos for comparison. Mean gradient: 26mm Hg (S). Peak gradient: 51mm Hg (S). - Aorta: Aortic root dimension: 31mm (ED). Ascending aortic diameter: 38mm (S). - Tricuspid valve: Mild regurgitation.    ROS: Denies fever, malais, weight loss, blurry vision, decreased visual acuity, cough, sputum, SOB, hemoptysis, pleuritic pain, palpitaitons, heartburn, abdominal pain, melena, lower extremity edema, claudication, or rash.  All other systems reviewed and negative  General: Affect appropriate Healthy:  appears stated age HEENT: normal Neck supple with no adenopathy JVP normal no bruits no thyromegaly Lungs clear with no wheezing and good diaphragmatic motion Heart:  S1/S2 click SEM no murmur, no rub, gallop or click PMI normal Abdomen: benighn, BS positve, no tenderness, no AAA no bruit.  No HSM or HJR Distal pulses intact with no bruits No edema Neuro non-focal Skin warm and dry No muscular weakness   Current Outpatient Prescriptions  Medication Sig Dispense Refill  . amoxicillin (AMOXIL) 500  MG capsule Take 1 tablet by mouth. Prior to dental procedures      . Calcium Carbonate-Vit D-Min (CALTRATE PLUS PO) Take by mouth daily.        . Multiple Vitamin (MULTIVITAMIN) capsule Take 2 capsules by mouth daily.       . norethindrone (MICRONOR) 0.35 MG tablet Take 1 tablet by mouth daily.       Marland Kitchen warfarin (COUMADIN) 7.5 MG tablet Take as directed by Anticoagulation clinic  90 tablet  1    Allergies  Morphine  Electrocardiogram:  Assessment and Plan

## 2011-09-24 NOTE — Assessment & Plan Note (Signed)
Valve sounds normal and patient asymptomatic  Increased gradient last year on echo.  F/U echo in a year.  SBE prophylaxis for root canal

## 2011-09-24 NOTE — Assessment & Plan Note (Signed)
Reviewed INR records Rx with no bleeding

## 2011-09-24 NOTE — Patient Instructions (Signed)
Your physician wants you to follow-up in: year  With dr Haywood Filler will receive a reminder letter in the mail two months in advance. If you don't receive a letter, please call our office to schedule the follow-up appointment. Your physician recommends that you continue on your current medications as directed. Please refer to the Current Medication list given to you today.

## 2011-10-08 ENCOUNTER — Ambulatory Visit (INDEPENDENT_AMBULATORY_CARE_PROVIDER_SITE_OTHER): Payer: Managed Care, Other (non HMO) | Admitting: *Deleted

## 2011-10-08 DIAGNOSIS — Z7901 Long term (current) use of anticoagulants: Secondary | ICD-10-CM

## 2011-10-08 DIAGNOSIS — Z954 Presence of other heart-valve replacement: Secondary | ICD-10-CM

## 2011-10-08 DIAGNOSIS — I359 Nonrheumatic aortic valve disorder, unspecified: Secondary | ICD-10-CM

## 2011-10-08 LAB — POCT INR: INR: 3.6

## 2011-10-22 ENCOUNTER — Ambulatory Visit (INDEPENDENT_AMBULATORY_CARE_PROVIDER_SITE_OTHER): Payer: Managed Care, Other (non HMO) | Admitting: *Deleted

## 2011-10-22 DIAGNOSIS — Z954 Presence of other heart-valve replacement: Secondary | ICD-10-CM

## 2011-10-22 DIAGNOSIS — I359 Nonrheumatic aortic valve disorder, unspecified: Secondary | ICD-10-CM

## 2011-10-22 DIAGNOSIS — Z7901 Long term (current) use of anticoagulants: Secondary | ICD-10-CM

## 2011-11-12 ENCOUNTER — Ambulatory Visit (INDEPENDENT_AMBULATORY_CARE_PROVIDER_SITE_OTHER): Payer: Managed Care, Other (non HMO)

## 2011-11-12 DIAGNOSIS — Z954 Presence of other heart-valve replacement: Secondary | ICD-10-CM

## 2011-11-12 DIAGNOSIS — I359 Nonrheumatic aortic valve disorder, unspecified: Secondary | ICD-10-CM

## 2011-11-12 DIAGNOSIS — Z7901 Long term (current) use of anticoagulants: Secondary | ICD-10-CM

## 2011-12-10 ENCOUNTER — Ambulatory Visit (INDEPENDENT_AMBULATORY_CARE_PROVIDER_SITE_OTHER): Payer: Managed Care, Other (non HMO) | Admitting: Pharmacist

## 2011-12-10 DIAGNOSIS — I359 Nonrheumatic aortic valve disorder, unspecified: Secondary | ICD-10-CM

## 2011-12-10 DIAGNOSIS — Z954 Presence of other heart-valve replacement: Secondary | ICD-10-CM

## 2011-12-10 DIAGNOSIS — Z7901 Long term (current) use of anticoagulants: Secondary | ICD-10-CM

## 2011-12-10 LAB — POCT INR: INR: 4

## 2012-01-04 ENCOUNTER — Ambulatory Visit (INDEPENDENT_AMBULATORY_CARE_PROVIDER_SITE_OTHER): Payer: Managed Care, Other (non HMO) | Admitting: *Deleted

## 2012-01-04 DIAGNOSIS — Z7901 Long term (current) use of anticoagulants: Secondary | ICD-10-CM

## 2012-01-04 DIAGNOSIS — I359 Nonrheumatic aortic valve disorder, unspecified: Secondary | ICD-10-CM

## 2012-01-04 DIAGNOSIS — Z954 Presence of other heart-valve replacement: Secondary | ICD-10-CM

## 2012-01-04 LAB — POCT INR: INR: 4.2

## 2012-01-16 LAB — HM PAP SMEAR

## 2012-01-31 ENCOUNTER — Ambulatory Visit (INDEPENDENT_AMBULATORY_CARE_PROVIDER_SITE_OTHER): Payer: Managed Care, Other (non HMO) | Admitting: *Deleted

## 2012-01-31 ENCOUNTER — Other Ambulatory Visit: Payer: Self-pay | Admitting: Obstetrics & Gynecology

## 2012-01-31 DIAGNOSIS — I359 Nonrheumatic aortic valve disorder, unspecified: Secondary | ICD-10-CM

## 2012-01-31 DIAGNOSIS — Z1231 Encounter for screening mammogram for malignant neoplasm of breast: Secondary | ICD-10-CM

## 2012-01-31 DIAGNOSIS — Z7901 Long term (current) use of anticoagulants: Secondary | ICD-10-CM

## 2012-01-31 DIAGNOSIS — Z954 Presence of other heart-valve replacement: Secondary | ICD-10-CM

## 2012-02-14 ENCOUNTER — Ambulatory Visit
Admission: RE | Admit: 2012-02-14 | Discharge: 2012-02-14 | Disposition: A | Discharge status: Home / Self Care | Payer: Managed Care, Other (non HMO) | Source: Ambulatory Visit | Attending: Obstetrics & Gynecology | Admitting: Obstetrics & Gynecology

## 2012-02-14 DIAGNOSIS — Z1231 Encounter for screening mammogram for malignant neoplasm of breast: Secondary | ICD-10-CM

## 2012-02-22 ENCOUNTER — Ambulatory Visit (INDEPENDENT_AMBULATORY_CARE_PROVIDER_SITE_OTHER): Payer: Managed Care, Other (non HMO)

## 2012-02-22 DIAGNOSIS — Z7901 Long term (current) use of anticoagulants: Secondary | ICD-10-CM

## 2012-02-22 DIAGNOSIS — Z954 Presence of other heart-valve replacement: Secondary | ICD-10-CM

## 2012-02-22 DIAGNOSIS — I359 Nonrheumatic aortic valve disorder, unspecified: Secondary | ICD-10-CM

## 2012-02-22 LAB — POCT INR: INR: 3.6

## 2012-03-23 ENCOUNTER — Ambulatory Visit (INDEPENDENT_AMBULATORY_CARE_PROVIDER_SITE_OTHER): Payer: Managed Care, Other (non HMO)

## 2012-03-23 DIAGNOSIS — Z7901 Long term (current) use of anticoagulants: Secondary | ICD-10-CM

## 2012-03-23 DIAGNOSIS — I359 Nonrheumatic aortic valve disorder, unspecified: Secondary | ICD-10-CM

## 2012-03-23 DIAGNOSIS — Z954 Presence of other heart-valve replacement: Secondary | ICD-10-CM

## 2012-03-23 LAB — POCT INR: INR: 3.3

## 2012-03-23 MED ORDER — WARFARIN SODIUM 7.5 MG PO TABS: ORAL_TABLET | ORAL | Status: DC

## 2012-04-20 ENCOUNTER — Ambulatory Visit (INDEPENDENT_AMBULATORY_CARE_PROVIDER_SITE_OTHER): Payer: Managed Care, Other (non HMO)

## 2012-04-20 DIAGNOSIS — Z954 Presence of other heart-valve replacement: Secondary | ICD-10-CM

## 2012-04-20 DIAGNOSIS — I359 Nonrheumatic aortic valve disorder, unspecified: Secondary | ICD-10-CM

## 2012-04-20 DIAGNOSIS — Z7901 Long term (current) use of anticoagulants: Secondary | ICD-10-CM

## 2012-04-20 LAB — POCT INR: INR: 3.2

## 2012-05-02 ENCOUNTER — Ambulatory Visit (INDEPENDENT_AMBULATORY_CARE_PROVIDER_SITE_OTHER): Payer: Managed Care, Other (non HMO) | Admitting: *Deleted

## 2012-05-02 DIAGNOSIS — I359 Nonrheumatic aortic valve disorder, unspecified: Secondary | ICD-10-CM

## 2012-05-02 DIAGNOSIS — Z954 Presence of other heart-valve replacement: Secondary | ICD-10-CM

## 2012-05-02 DIAGNOSIS — Z7901 Long term (current) use of anticoagulants: Secondary | ICD-10-CM

## 2012-05-02 LAB — POCT INR: INR: 4.1

## 2012-05-29 ENCOUNTER — Ambulatory Visit (INDEPENDENT_AMBULATORY_CARE_PROVIDER_SITE_OTHER): Payer: Managed Care, Other (non HMO)

## 2012-05-29 DIAGNOSIS — I359 Nonrheumatic aortic valve disorder, unspecified: Secondary | ICD-10-CM

## 2012-05-29 DIAGNOSIS — Z7901 Long term (current) use of anticoagulants: Secondary | ICD-10-CM

## 2012-05-29 DIAGNOSIS — Z954 Presence of other heart-valve replacement: Secondary | ICD-10-CM

## 2012-05-29 LAB — POCT INR: INR: 3.1

## 2012-06-27 ENCOUNTER — Ambulatory Visit (INDEPENDENT_AMBULATORY_CARE_PROVIDER_SITE_OTHER): Payer: Managed Care, Other (non HMO) | Admitting: *Deleted

## 2012-06-27 DIAGNOSIS — Z7901 Long term (current) use of anticoagulants: Secondary | ICD-10-CM

## 2012-06-27 DIAGNOSIS — Z954 Presence of other heart-valve replacement: Secondary | ICD-10-CM

## 2012-06-27 DIAGNOSIS — I359 Nonrheumatic aortic valve disorder, unspecified: Secondary | ICD-10-CM

## 2012-07-13 ENCOUNTER — Telehealth: Payer: Self-pay | Admitting: Cardiovascular Disease

## 2012-07-13 NOTE — Telephone Encounter (Addendum)
Pt has to have a tooth pulled , does she need to come off her coumadin? For how long?  Also does pt need to have a oral surgeon do it, or is a dentist ok?  pls call [phone]

## 2012-07-13 NOTE — Telephone Encounter (Signed)
Told pt to have her endodontist which she just saw to  recommend if a dentist or oral surgeon is needed. Told her to have them call if coumadin will need to be held if tooth needs to be pulled or a crown will work. Pt verbalized understanding.

## 2012-07-28 ENCOUNTER — Ambulatory Visit (INDEPENDENT_AMBULATORY_CARE_PROVIDER_SITE_OTHER): Payer: Managed Care, Other (non HMO) | Admitting: *Deleted

## 2012-07-28 DIAGNOSIS — Z954 Presence of other heart-valve replacement: Secondary | ICD-10-CM

## 2012-07-28 LAB — POCT INR: INR: 4.2

## 2012-08-11 ENCOUNTER — Ambulatory Visit (INDEPENDENT_AMBULATORY_CARE_PROVIDER_SITE_OTHER): Payer: Managed Care, Other (non HMO) | Admitting: *Deleted

## 2012-08-11 DIAGNOSIS — I359 Nonrheumatic aortic valve disorder, unspecified: Secondary | ICD-10-CM

## 2012-08-11 DIAGNOSIS — Z954 Presence of other heart-valve replacement: Secondary | ICD-10-CM

## 2012-08-11 DIAGNOSIS — Z7901 Long term (current) use of anticoagulants: Secondary | ICD-10-CM

## 2012-08-11 LAB — POCT INR: INR: 2.9

## 2012-08-31 ENCOUNTER — Ambulatory Visit (INDEPENDENT_AMBULATORY_CARE_PROVIDER_SITE_OTHER): Payer: Managed Care, Other (non HMO) | Admitting: *Deleted

## 2012-08-31 DIAGNOSIS — Z7901 Long term (current) use of anticoagulants: Secondary | ICD-10-CM

## 2012-08-31 DIAGNOSIS — I359 Nonrheumatic aortic valve disorder, unspecified: Secondary | ICD-10-CM

## 2012-08-31 DIAGNOSIS — Z954 Presence of other heart-valve replacement: Secondary | ICD-10-CM

## 2012-09-19 ENCOUNTER — Ambulatory Visit (INDEPENDENT_AMBULATORY_CARE_PROVIDER_SITE_OTHER): Payer: Managed Care, Other (non HMO) | Admitting: *Deleted

## 2012-09-19 ENCOUNTER — Encounter: Payer: Self-pay | Admitting: Cardiovascular Disease

## 2012-09-19 ENCOUNTER — Ambulatory Visit (INDEPENDENT_AMBULATORY_CARE_PROVIDER_SITE_OTHER): Payer: Managed Care, Other (non HMO) | Admitting: Cardiovascular Disease

## 2012-09-19 VITALS — BP 134/78 | HR 70 | Resp 16 | Ht 61.0 in | Wt 153.0 lb

## 2012-09-19 DIAGNOSIS — I359 Nonrheumatic aortic valve disorder, unspecified: Secondary | ICD-10-CM

## 2012-09-19 DIAGNOSIS — Z954 Presence of other heart-valve replacement: Secondary | ICD-10-CM

## 2012-09-19 DIAGNOSIS — R079 Chest pain, unspecified: Secondary | ICD-10-CM

## 2012-09-19 DIAGNOSIS — Z7901 Long term (current) use of anticoagulants: Secondary | ICD-10-CM

## 2012-09-19 DIAGNOSIS — Z952 Presence of prosthetic heart valve: Secondary | ICD-10-CM

## 2012-09-19 MED ORDER — WARFARIN SODIUM 7.5 MG PO TABS: ORAL_TABLET | ORAL | Status: DC

## 2012-09-19 NOTE — Assessment & Plan Note (Signed)
F/U coumadin clinic today No bleeding issues

## 2012-09-19 NOTE — Assessment & Plan Note (Signed)
Normal exam INR;s Rx  Has been using SBE Dont think dizzyness or atypical left sided pain related to AVR  F/U echo

## 2012-09-19 NOTE — Assessment & Plan Note (Signed)
Atypical prevoius costrochondritis.  ECG normal and no CAD before AVR  Will see what echo looks like. May need ETT

## 2012-09-19 NOTE — Patient Instructions (Addendum)

## 2012-09-19 NOTE — Progress Notes (Signed)
Patient ID: Vanessa Owens, female   DOB: [birth year], 52 y.o.   MRN: [id number] Vanessa Owens returns today for F/U post AVR in 2001 for bicuspid valve. Previously followed by Dr Fraser Din. Last echo reviewed from 09/29/10 with mild LVH, normal EF, mean gradient across AVR 20mmHg/peak and mild AR. Needs a root canal. SBE prophylaxis discussed. Complained about facility fee with last echo. May try to have it at [facility] next year.  Echo 09/29/10  Study Conclusions  - Left ventricle: The cavity size was normal. Wall thickness was increased in a pattern of mild LVH. The estimated ejection fraction was 65%. Wall motion was normal; there were no regional wall motion abnormalities. - Aortic valve: By history there is a St. Jude mechanical aortic valve. The gradients now are higher than 2010. It seems to me that we may not be seeing a crisp image of the disc motion. Iam not able to access the prior echos for comparison. Mean gradient: 26mm Hg (S). Peak gradient: 51mm Hg (S). - Aorta: Aortic root dimension: 31mm (ED). Ascending aortic diameter: 38mm (S). - Tricuspid valve: Mild regurgitation.  Some atypical left breast pain. Some dizzyness that she has had before Not postural.  INR;s been Rx Had root canal about a month ago and had SBE   ROS: Denies fever, malais, weight loss, blurry vision, decreased visual acuity, cough, sputum, SOB, hemoptysis, pleuritic pain, palpitaitons, heartburn, abdominal pain, melena, lower extremity edema, claudication, or rash.  All other systems reviewed and negative  General: Affect appropriate Healthy:  appears stated age HEENT: normal Neck supple with no adenopathy JVP normal no bruits no thyromegaly Lungs clear with no wheezing and good diaphragmatic motion Heart:  S1/S2 click SEM no AR  murmur, no rub, gallop or click PMI normal Abdomen: benighn, BS positve, no tenderness, no AAA no bruit.  No HSM or HJR Distal pulses intact with no bruits No edema Neuro  non-focal Skin warm and dry No muscular weakness   Current Outpatient Prescriptions  Medication Sig Dispense Refill  . Calcium Carbonate-Vit D-Min (CALTRATE PLUS PO) Take by mouth daily.        . Multiple Vitamin (MULTIVITAMIN) capsule Take 2 capsules by mouth daily.       . norethindrone (MICRONOR) 0.35 MG tablet Take 1 tablet by mouth daily.       Marland Kitchen warfarin (COUMADIN) 7.5 MG tablet Take as directed by Anticoagulation clinic  90 tablet  1  . amoxicillin (AMOXIL) 500 MG capsule Take 1 tablet by mouth. Prior to dental procedures       No current facility-administered medications for this visit.    Allergies  Morphine  Electrocardiogram: NSR rate 70 normal  Assessment and Plan

## 2012-09-26 ENCOUNTER — Ambulatory Visit (HOSPITAL_COMMUNITY): Payer: Managed Care, Other (non HMO) | Attending: Cardiology | Admitting: Radiology

## 2012-09-26 DIAGNOSIS — Z952 Presence of prosthetic heart valve: Secondary | ICD-10-CM

## 2012-09-26 DIAGNOSIS — Q231 Congenital insufficiency of aortic valve: Secondary | ICD-10-CM | POA: Insufficient documentation

## 2012-09-26 DIAGNOSIS — I079 Rheumatic tricuspid valve disease, unspecified: Secondary | ICD-10-CM | POA: Insufficient documentation

## 2012-09-26 DIAGNOSIS — R0609 Other forms of dyspnea: Secondary | ICD-10-CM | POA: Insufficient documentation

## 2012-09-26 DIAGNOSIS — R0989 Other specified symptoms and signs involving the circulatory and respiratory systems: Secondary | ICD-10-CM | POA: Insufficient documentation

## 2012-09-26 DIAGNOSIS — Z87891 Personal history of nicotine dependence: Secondary | ICD-10-CM | POA: Insufficient documentation

## 2012-09-26 DIAGNOSIS — I359 Nonrheumatic aortic valve disorder, unspecified: Secondary | ICD-10-CM

## 2012-09-26 DIAGNOSIS — R079 Chest pain, unspecified: Secondary | ICD-10-CM | POA: Insufficient documentation

## 2012-09-26 NOTE — Progress Notes (Signed)
Echocardiogram performed.  

## 2012-09-27 ENCOUNTER — Telehealth: Payer: Self-pay | Admitting: Cardiovascular Disease

## 2012-09-27 NOTE — Telephone Encounter (Signed)
Follow-up: ° ° ° °Patient called in returning your call.  Please call back. °

## 2012-09-27 NOTE — Telephone Encounter (Signed)
Lmtcb./cy 

## 2012-09-27 NOTE — Telephone Encounter (Signed)
New problem ° ° °Pt stated she is returning your call. °

## 2012-09-27 NOTE — Telephone Encounter (Signed)
New problem    Pt stated she was returning your call. Please call pt

## 2012-09-28 NOTE — Telephone Encounter (Signed)
PT AWARE OF ECHO RESULTS./CY 

## 2012-09-28 NOTE — Telephone Encounter (Signed)
Follow Up     Pt calling in following up on ECHO results. Please call back.

## 2012-10-02 ENCOUNTER — Telehealth: Payer: Self-pay | Admitting: Cardiovascular Disease

## 2012-10-02 NOTE — Telephone Encounter (Signed)
New Prob     Pt calling in wanting to know if PREDNISONE & ZYRTEC & COUMADIN have any negative interactions if taken together. Please call back.

## 2012-10-02 NOTE — Telephone Encounter (Signed)
Spoke with pt and she taking Methapred 4mg  tapering  dose pack that started on Friday and she completes it on Wednesday. Since dose is small  there should not be any interaction with coumadin or with her zyrtec/prednisone.

## 2012-10-18 ENCOUNTER — Ambulatory Visit (INDEPENDENT_AMBULATORY_CARE_PROVIDER_SITE_OTHER): Payer: Managed Care, Other (non HMO) | Admitting: *Deleted

## 2012-10-18 DIAGNOSIS — Z7901 Long term (current) use of anticoagulants: Secondary | ICD-10-CM

## 2012-10-18 DIAGNOSIS — I359 Nonrheumatic aortic valve disorder, unspecified: Secondary | ICD-10-CM

## 2012-10-18 DIAGNOSIS — Z954 Presence of other heart-valve replacement: Secondary | ICD-10-CM

## 2012-11-15 ENCOUNTER — Ambulatory Visit (INDEPENDENT_AMBULATORY_CARE_PROVIDER_SITE_OTHER): Payer: Managed Care, Other (non HMO) | Admitting: *Deleted

## 2012-11-15 DIAGNOSIS — I359 Nonrheumatic aortic valve disorder, unspecified: Secondary | ICD-10-CM

## 2012-11-15 DIAGNOSIS — Z7901 Long term (current) use of anticoagulants: Secondary | ICD-10-CM

## 2012-11-15 DIAGNOSIS — Z954 Presence of other heart-valve replacement: Secondary | ICD-10-CM

## 2012-11-15 LAB — POCT INR: INR: 3.1

## 2012-11-21 ENCOUNTER — Emergency Department (INDEPENDENT_AMBULATORY_CARE_PROVIDER_SITE_OTHER)
Admission: EM | Admit: 2012-11-21 | Discharge: 2012-11-21 | Disposition: A | Discharge status: Home / Self Care | Payer: Managed Care, Other (non HMO) | Source: Home / Self Care | Attending: Emergency Medicine | Admitting: Emergency Medicine

## 2012-11-21 DIAGNOSIS — B029 Zoster without complications: Secondary | ICD-10-CM

## 2012-11-21 DIAGNOSIS — S139XXA Sprain of joints and ligaments of unspecified parts of neck, initial encounter: Secondary | ICD-10-CM

## 2012-11-21 DIAGNOSIS — S161XXA Strain of muscle, fascia and tendon at neck level, initial encounter: Secondary | ICD-10-CM

## 2012-11-21 MED ORDER — VALACYCLOVIR HCL 1 G PO TABS: 1000.0000 mg | ORAL_TABLET | Freq: Three times a day (TID) | ORAL | Status: DC

## 2012-11-21 MED ORDER — CYCLOBENZAPRINE HCL 5 MG PO TABS: 5.0000 mg | ORAL_TABLET | Freq: Three times a day (TID) | ORAL | Status: DC | PRN

## 2012-11-21 MED ORDER — PREDNISONE (PAK) 10 MG PO TABS: 10.0000 mg | ORAL_TABLET | Freq: Every day | ORAL | Status: DC

## 2012-11-21 NOTE — ED Notes (Signed)
Vanessa Owens complains of neck and left arm pain for 2 days. The pain is worse today. She applied heat with little relief.   She also reports an insect bite 2 days ago. The area is red and swollen. Denies fever, chills or sweats.

## 2012-11-21 NOTE — ED Provider Notes (Signed)
History    CSN: [id number] Arrival date & time 11/21/12  1727  First MD Initiated Contact with Patient 11/21/12 1740     Chief Complaint  Patient presents with  . Insect Bite    x 2 days  . Neck Pain    x 2 days   (Consider location/radiation/quality/duration/timing/severity/associated sxs/prior Treatment) HPI 1) this patient is a 52 year old white female who comes in today complaining of a possible insect bite on her left buttock about 2 days ago.  She states that the area is tender to light touch ankle swollen.  She is unsure what she got bit by bit seems like it's getting old but worse.  No fever chills or sweats reported.  2 she also reports some left arm and neck pain for last 2 days.  She works at [employer] and does frequent lifting pushing and pulling and also states that she over does it sometimes when she is working out in her yard and mowing.  She feels that the pain is sharp located mostly in the left shoulder and neck area but does radiate down and cause some mild numbness in her hand.  No cardiac symptoms.  No shortness of breath.  She has had some left sciatica symptoms in the past as well.  No known trauma or injury.)   Past Medical History  Diagnosis Date  . Aortic stenosis      by Dr. Donata Clay  . 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 Hypertriglyceridemia   . CHEST PAIN UNSPECIFIED     Qualifier: Diagnosis of  By: Georgina Pillion MD, Onalee Hua    . Long term current use of anticoagulant   . Shortness of breath     Qualifier: Diagnosis of  By: Elyn Peers LPN, [name]     Past Surgical History  Procedure Laterality Date  . Aortic valve replacement  2001    St Jude  . Cesarean section    . Appendectomy     Family History  Problem Relation Age of Onset  . Hypertension Mother   . Diabetes Mother   . Hyperlipidemia Mother   . Diabetes Brother   . Hyperlipidemia Brother   . Hypertension Brother   . Diabetes Other   . Diabetes Son    History  Substance Use  Topics  . Smoking status: Former Games developer  . Smokeless tobacco: Not on file  . Alcohol Use: No   OB History   Grav Para Term Preterm Abortions TAB SAB Ect Mult Living                 Review of Systems  All other systems reviewed and are negative.    Allergies  Morphine  Home Medications   Current Outpatient Rx  Name  Route  Sig  Dispense  Refill  . Calcium Carbonate-Vit D-Min (CALTRATE PLUS PO)   Oral   Take by mouth daily.           . Multiple Vitamin (MULTIVITAMIN) capsule   Oral   Take 2 capsules by mouth daily.          Marland Kitchen warfarin (COUMADIN) 7.5 MG tablet      Take as directed by Anticoagulation clinic   90 tablet   1     Dispense as written.    90 day supply   . amoxicillin (AMOXIL) 500 MG capsule   Oral   Take 1 tablet by mouth. Prior to dental procedures         .  cyclobenzaprine (FLEXERIL) 5 MG tablet   Oral   Take 1 tablet (5 mg total) by mouth 3 (three) times daily as needed for muscle spasms.   21 tablet   0   . norethindrone (MICRONOR) 0.35 MG tablet   Oral   Take 1 tablet by mouth daily.          . predniSONE (STERAPRED UNI-PAK) 10 MG tablet   Oral   Take 1 tablet (10 mg total) by mouth daily. 6 day pack, use as directed   21 tablet   0   . valACYclovir (VALTREX) 1000 MG tablet   Oral   Take 1 tablet (1,000 mg total) by mouth 3 (three) times daily.   21 tablet   0    BP 140/80  Pulse 69  Temp(Src) 98.1 F (36.7 C) (Oral)  Ht 5' (1.524 m)  Wt 152 lb (68.947 kg)  BMI 29.69 kg/m2  SpO2 99% Physical Exam  Nursing note and vitals reviewed. Constitutional: She is oriented to person, place, and time. She appears well-developed and well-nourished.  HENT:  Head: Normocephalic and atraumatic.  Eyes: No scleral icterus.  Neck: Neck supple.  Cardiovascular: Regular rhythm and normal heart sounds.   Pulmonary/Chest: Effort normal and breath sounds normal. No respiratory distress.  Musculoskeletal:  Right paracervical spasms and  tenderness around the upper and mid trapezius and parascapular muscles.  Distal neurovascular status intact.  No rash or swelling seen.  Spurling test is negative.  Neurological: She is alert and oriented to person, place, and time.  Skin: Skin is warm and dry.     Psychiatric: She has a normal mood and affect. Her speech is normal.    ED Course  Procedures (including critical care time) Labs Reviewed - No data to display No results found. 1. Shingles   2. Cervical strain, initial encounter     MDM   1) for her shingles, we placed her on a six-day prednisone pack and Valtrex.  She can also use topical corticosteroids as well for the rotation.  However at this time I do not believe that any signs of infection.  However if she is going to call back for increased redness swelling or drainage, we can place her on an antibiotic at that time.  2) for the paracervical strain, she is already to be on prednisone which will likely help.  We'll also place her on a muscle relaxant for short-term and encouraged heating pad use.  If she is not improving, then she'll need to followup in sports medicine for further evaluation.  Marlaine Hind, MD 11/21/12 [phone]

## 2012-11-29 ENCOUNTER — Telehealth: Payer: Self-pay | Admitting: Cardiovascular Disease

## 2012-11-29 NOTE — Telephone Encounter (Signed)
Spoke with pt.  She recently had shingles and took Valtrex and prednisone dose pack.  She finished them over the weekend.  Wanted to make sure they would not change INR.

## 2012-11-29 NOTE — Telephone Encounter (Signed)
Informed pt that prednisone may increase INR but since she has already finished therapy, will not check INR.  Just monitor for extra bruising or bleeding.  Will follow up as previously planned.

## 2012-11-29 NOTE — Telephone Encounter (Signed)
New Prob      Pt has some questions regarding drug interaction with her coumadin. Please call.

## 2012-12-19 ENCOUNTER — Telehealth: Payer: Self-pay | Admitting: Obstetrics & Gynecology

## 2012-12-19 NOTE — Telephone Encounter (Signed)
LVM advising appointment time change. Appt was moved from 0715 to 1030 on 8/14

## 2012-12-25 ENCOUNTER — Emergency Department (INDEPENDENT_AMBULATORY_CARE_PROVIDER_SITE_OTHER)
Admission: EM | Admit: 2012-12-25 | Discharge: 2012-12-25 | Disposition: A | Discharge status: Home / Self Care | Payer: Managed Care, Other (non HMO) | Source: Home / Self Care

## 2012-12-25 ENCOUNTER — Encounter: Payer: Self-pay | Admitting: Emergency Medicine

## 2012-12-25 DIAGNOSIS — R21 Rash and other nonspecific skin eruption: Secondary | ICD-10-CM

## 2012-12-25 MED ORDER — VALACYCLOVIR HCL 1 G PO TABS: 1000.0000 mg | ORAL_TABLET | Freq: Three times a day (TID) | ORAL | Status: DC

## 2012-12-25 MED ORDER — PREDNISONE 20 MG PO TABS: 20.0000 mg | ORAL_TABLET | Freq: Two times a day (BID) | ORAL | Status: DC

## 2012-12-25 NOTE — ED Provider Notes (Signed)
CSN: [id number]     Arrival date & time 12/25/12  1755 History     None    Chief Complaint  Patient presents with  . Rash      HPI Comments: Patient was treated for herpes zoster on her left buttock approximately one month ago, and the rash promptly resolved at that time with Valtrex and prednisone. Today she noticed several small red bumps on the anterior/medial aspect of her right anterior thigh.  The area itched and burned.  Later today the lesions expanded in diameter, and then became surrounded by macular erythema.  She also noted a small lesion on her right pretibial area.  She recalls no insect bites.  She feels well otherwise. She is concerned that the rash may be recurrent shingles.  The history is provided by the patient.    Past Medical History  Diagnosis Date  . Aortic stenosis      by Dr. Donata Clay  . 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 Hypertriglyceridemia   . CHEST PAIN UNSPECIFIED     Qualifier: Diagnosis of  By: Georgina Pillion MD, Onalee Hua    . Long term current use of anticoagulant   . Shortness of breath     Qualifier: Diagnosis of  By: Elyn Peers LPN, [name]     Past Surgical History  Procedure Laterality Date  . Aortic valve replacement  2001    St Jude  . Cesarean section    . Appendectomy     Family History  Problem Relation Age of Onset  . Hypertension Mother   . Diabetes Mother   . Hyperlipidemia Mother   . Diabetes Brother   . Hyperlipidemia Brother   . Hypertension Brother   . Diabetes Other   . Diabetes Son    History  Substance Use Topics  . Smoking status: Former Games developer  . Smokeless tobacco: Not on file  . Alcohol Use: No   OB History   Grav Para Term Preterm Abortions TAB SAB Ect Mult Living                 Review of Systems  Constitutional: Negative.   Eyes: Negative.   Respiratory: Negative.   Gastrointestinal: Negative.   Endocrine: Negative.   Genitourinary: Negative.   Musculoskeletal: Negative.   Skin: Positive  for rash.  Neurological: Positive for headaches.    Allergies  Morphine  Home Medications   Current Outpatient Rx  Name  Route  Sig  Dispense  Refill  . amoxicillin (AMOXIL) 500 MG capsule   Oral   Take 1 tablet by mouth. Prior to dental procedures         . Calcium Carbonate-Vit D-Min (CALTRATE PLUS PO)   Oral   Take by mouth daily.           . cyclobenzaprine (FLEXERIL) 5 MG tablet   Oral   Take 1 tablet (5 mg total) by mouth 3 (three) times daily as needed for muscle spasms.   21 tablet   0   . Multiple Vitamin (MULTIVITAMIN) capsule   Oral   Take 2 capsules by mouth daily.          . norethindrone (MICRONOR) 0.35 MG tablet   Oral   Take 1 tablet by mouth daily.          . predniSONE (DELTASONE) 20 MG tablet   Oral   Take 1 tablet (20 mg total) by mouth 2 (two) times daily. Take  with food.   10 tablet   0   . valACYclovir (VALTREX) 1000 MG tablet   Oral   Take 1 tablet (1,000 mg total) by mouth 3 (three) times daily.   21 tablet   0   . warfarin (COUMADIN) 7.5 MG tablet      Take as directed by Anticoagulation clinic   90 tablet   1     Dispense as written.    90 day supply    BP 141/85  Pulse 70  Temp(Src) 98 F (36.7 C) (Oral)  Resp 16  Ht 5\' 1"  (1.549 m)  Wt 154 lb (69.854 kg)  BMI 29.11 kg/m2  SpO2 99% Physical Exam  Nursing note and vitals reviewed. Constitutional: She is oriented to person, place, and time. She appears well-developed and well-nourished. No distress.  HENT:  Head: Atraumatic.  Mouth/Throat: Oropharynx is clear and moist.  Eyes: Conjunctivae are normal. Pupils are equal, round, and reactive to light.  Neck: Neck supple.  Cardiovascular: Normal heart sounds.   Pulmonary/Chest: Breath sounds normal.  Musculoskeletal: She exhibits no edema.  Lymphadenopathy:    She has no cervical adenopathy.  Neurological: She is alert and oriented to person, place, and time.  Skin: Skin is warm and dry. Rash noted. Rash is  maculopapular.     On the patient's right anterior/medial thigh is an area of macular erythema about 6cm dia with four central slightly raised urticarial appearing lesions.  The area is slightly warm but nontender, not indurated, and not fluctuant.  There is a small 3 to 4mm lesion on the right pre-tibial area.  No calf or thigh tenderness otherwise.     ED Course   Procedures  none    1. Rash and nonspecific skin eruption; ?recurrent herpes zoster, ?urticaria     MDM  Begin Valtrex, and prednisone burst. May apply 1% Hydrocortisone cream.  May take Benadryl for itching. Followup with dermatologist if not improving 4 days.  Lattie Haw, MD 12/25/12 1949

## 2012-12-25 NOTE — ED Notes (Signed)
Reports recent dx shingles; took all prescribed meds and area cleared; no recent new outbreak on legs.

## 2012-12-26 ENCOUNTER — Telehealth: Payer: Self-pay | Admitting: *Deleted

## 2012-12-26 DIAGNOSIS — Z952 Presence of prosthetic heart valve: Secondary | ICD-10-CM | POA: Insufficient documentation

## 2012-12-26 NOTE — Telephone Encounter (Signed)
Called as she has been ordered Prednisone  20 mg bid for 10 days and started yesterday and also ordered Keflex. Pt instructed that the Prednisone would affect INR and that Keflex would not.Has an appt to be seen in clinic in am. Instructed to take Prednisone and Keflex as ordered and to keep appt in am at coumadin clinic and she states understanding.

## 2012-12-27 ENCOUNTER — Ambulatory Visit (INDEPENDENT_AMBULATORY_CARE_PROVIDER_SITE_OTHER): Payer: Managed Care, Other (non HMO) | Admitting: *Deleted

## 2012-12-27 DIAGNOSIS — I359 Nonrheumatic aortic valve disorder, unspecified: Secondary | ICD-10-CM

## 2012-12-27 DIAGNOSIS — Z954 Presence of other heart-valve replacement: Secondary | ICD-10-CM

## 2012-12-27 DIAGNOSIS — Z7901 Long term (current) use of anticoagulants: Secondary | ICD-10-CM

## 2012-12-27 LAB — POCT INR: INR: 4.4

## 2012-12-28 ENCOUNTER — Ambulatory Visit: Payer: Self-pay | Admitting: Obstetrics & Gynecology

## 2013-01-01 ENCOUNTER — Encounter: Payer: Self-pay | Admitting: Obstetrics & Gynecology

## 2013-01-01 ENCOUNTER — Ambulatory Visit (INDEPENDENT_AMBULATORY_CARE_PROVIDER_SITE_OTHER): Payer: Managed Care, Other (non HMO) | Admitting: Obstetrics & Gynecology

## 2013-01-01 VITALS — BP 120/78 | HR 60 | Resp 16 | Ht 60.5 in | Wt 153.8 lb

## 2013-01-01 DIAGNOSIS — N912 Amenorrhea, unspecified: Secondary | ICD-10-CM

## 2013-01-01 DIAGNOSIS — Z0000 Encounter for general adult medical examination without abnormal findings: Secondary | ICD-10-CM

## 2013-01-01 DIAGNOSIS — L680 Hirsutism: Secondary | ICD-10-CM

## 2013-01-01 DIAGNOSIS — Z01419 Encounter for gynecological examination (general) (routine) without abnormal findings: Secondary | ICD-10-CM

## 2013-01-01 LAB — POCT URINALYSIS DIPSTICK
Bilirubin, UA: NEGATIVE
Blood, UA: NEGATIVE
Glucose, UA: NEGATIVE
Ketones, UA: NEGATIVE
Nitrite, UA: NEGATIVE
Protein, UA: NEGATIVE
Urobilinogen, UA: NEGATIVE
pH, UA: 7

## 2013-01-01 NOTE — Progress Notes (Signed)
52 y.o. G2P2 SingleCaucasianF here for annual exam.  No vaginal bleeding.  Diagnosed in July with shingles.  Patient not sure that was the diagnosis because she says the area was so small.  Was treated with prednisone and Valtrex.  Went back to urgent care early this month due to questionable bug bite.  She is on antibiotics now for this.  Having laser hair removal on face.  Having some trouble with getting this treated.  Was recommended to check testosterone level.     Didn't try the Myrbetriq last year.  States urinary symptoms are no worse.     Patient's last menstrual period was 05/18/2003.          Sexually active: yes  The current method of family planning is oral progesterone-only contraceptive.    Exercising: yes  walking Smoker:  no  Health Maintenance: Pap:  01/06/12 WNL/negative HR HPV History of abnormal Pap:  yes MMG:  02/14/12 normal Colonoscopy:  none BMD:   4/07  TDaP:  6/11 Screening Labs: work (copy faxed into [employer]), Hb today: n/a, Urine today: WBC-1+, PH-7.0   reports that she has never smoked. She has never used smokeless tobacco. She reports that she drinks about 0.5 ounces of alcohol per week. She reports that she does not use illicit drugs.  Past Medical History  Diagnosis Date  . Aortic stenosis      by Dr. Donata Clay  . 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 Hypertriglyceridemia   . CHEST PAIN UNSPECIFIED     Qualifier: Diagnosis of  By: Georgina Pillion MD, Onalee Hua    . Long term current use of anticoagulant   . Shortness of breath     Qualifier: Diagnosis of  By: Elyn Peers, LPN, [name]    . Anemia   . Vulvar ulceration     recurrent  . OAB (overactive bladder)   . Osteopenia   . Migraine   . Costochondritis     x2  . Shingles     ? diag/rash 11/21/12  . Rash     Past Surgical History  Procedure Laterality Date  . Aortic valve replacement  2001    St Jude  . Cesarean section    . Appendectomy      post op abdominal wall hematoma/2 transfusions   . Breast biopsy  5/08    left breast    Current Outpatient Prescriptions  Medication Sig Dispense Refill  . Calcium Carbonate-Vit D-Min (CALTRATE PLUS PO) Take by mouth daily.        . cephALEXin (KEFLEX) 500 MG capsule 500 mg 3 (three) times daily.      . Multiple Vitamin (MULTIVITAMIN) capsule Take 2 capsules by mouth daily.       . norethindrone (MICRONOR) 0.35 MG tablet Take 1 tablet by mouth daily.       Marland Kitchen warfarin (COUMADIN) 7.5 MG tablet Take as directed by Anticoagulation clinic  90 tablet  1  . amoxicillin (AMOXIL) 500 MG capsule Take 1 tablet by mouth. Prior to dental procedures      . predniSONE (DELTASONE) 20 MG tablet Take 1 tablet (20 mg total) by mouth 2 (two) times daily. Take with food.  10 tablet  0  . valACYclovir (VALTREX) 1000 MG tablet Take 1 tablet (1,000 mg total) by mouth 3 (three) times daily.  21 tablet  0   No current facility-administered medications for this visit.    Family History  Problem Relation Age of  Onset  . Hypertension Mother   . Diabetes Mother   . Hyperlipidemia Mother   . Diabetes Brother   . Hyperlipidemia Brother   . Hypertension Brother   . Diabetes Other   . Diabetes Son   . Alzheimer's disease Maternal Grandfather     ROS:  Pertinent items are noted in HPI.  Otherwise, a comprehensive ROS was negative.  Exam:   BP 120/78  Pulse 60  Resp 16  Ht 5' 0.5" (1.537 m)  Wt 153 lb 12.8 oz (69.763 kg)  BMI 29.53 kg/m2  LMP 05/18/2003  Weight change: +7lb  Height: 5' 0.5" (153.7 cm)  Ht Readings from Last 3 Encounters:  01/01/13 5' 0.5" (1.537 m)  12/25/12 5\' 1"  (1.549 m)  11/21/12 5' (1.524 m)    General appearance: alert, cooperative and appears stated age Head: Normocephalic, without obvious abnormality, atraumatic Neck: no adenopathy, supple, symmetrical, trachea midline and thyroid normal to inspection and palpation Lungs: clear to auscultation bilaterally Breasts: normal appearance, no masses or tenderness Heart: regular  rate and rhythm Abdomen: soft, non-tender; bowel sounds normal; no masses,  no organomegaly Extremities: extremities normal, atraumatic, no cyanosis or edema Skin: Skin color, texture, turgor normal. No rashes or lesions Lymph nodes: Cervical, supraclavicular, and axillary nodes normal. No abnormal inguinal nodes palpated Neurologic: Grossly normal   Pelvic: External genitalia:  no lesions              Urethra:  normal appearing urethra with no masses, tenderness or lesions              Bartholins and Skenes: normal                 Vagina: normal appearing vagina with normal color and discharge, no lesions              Cervix: no lesions              Pap taken: no Bimanual Exam:  Uterus:  normal size, contour, position, consistency, mobility, non-tender              Adnexa: normal adnexa and no mass, fullness, tenderness               Rectovaginal: Confirms               Anus:  normal sphincter tone, no lesions  A:  Well Woman with normal exam Amenorrhea on micronir Aortic valve replacement on anticoagulation Osteopenia OAB  P:   Mammogram yearly. pap smear with neg HR HPV last year.   Declines colonoscopy today.  OC light test.   FSH and testosterone level today. return annually or prn  An After Visit Summary was printed and given to the patient.

## 2013-01-01 NOTE — Patient Instructions (Addendum)

## 2013-01-02 ENCOUNTER — Ambulatory Visit (INDEPENDENT_AMBULATORY_CARE_PROVIDER_SITE_OTHER): Payer: Managed Care, Other (non HMO) | Admitting: *Deleted

## 2013-01-02 DIAGNOSIS — I359 Nonrheumatic aortic valve disorder, unspecified: Secondary | ICD-10-CM

## 2013-01-02 DIAGNOSIS — Z7901 Long term (current) use of anticoagulants: Secondary | ICD-10-CM

## 2013-01-02 DIAGNOSIS — Z954 Presence of other heart-valve replacement: Secondary | ICD-10-CM

## 2013-01-02 LAB — TESTOSTERONE: Testosterone: 43 ng/dL (ref 10–70)

## 2013-01-26 ENCOUNTER — Ambulatory Visit (INDEPENDENT_AMBULATORY_CARE_PROVIDER_SITE_OTHER): Payer: Managed Care, Other (non HMO) | Admitting: *Deleted

## 2013-01-26 DIAGNOSIS — I359 Nonrheumatic aortic valve disorder, unspecified: Secondary | ICD-10-CM

## 2013-01-26 DIAGNOSIS — Z7901 Long term (current) use of anticoagulants: Secondary | ICD-10-CM

## 2013-01-26 DIAGNOSIS — Z954 Presence of other heart-valve replacement: Secondary | ICD-10-CM

## 2013-02-02 ENCOUNTER — Other Ambulatory Visit: Payer: Self-pay | Admitting: Obstetrics & Gynecology

## 2013-02-02 ENCOUNTER — Telehealth: Payer: Self-pay | Admitting: Obstetrics & Gynecology

## 2013-02-02 MED ORDER — NORETHINDRONE 0.35 MG PO TABS: 1.0000 | ORAL_TABLET | Freq: Every day | ORAL | Status: DC

## 2013-02-02 NOTE — Telephone Encounter (Signed)
Patient needs refills on her Micro-nor.  [facility]  [address]  Kathryne Sharper.

## 2013-02-02 NOTE — Telephone Encounter (Signed)
Refill request for [facility] Last filled by MD on - 01/06/12 Last AEX - 01/01/13 Next AEX - 01/07/14 RX sent.

## 2013-02-05 ENCOUNTER — Telehealth: Payer: Self-pay | Admitting: Obstetrics & Gynecology

## 2013-02-05 NOTE — Telephone Encounter (Signed)
Rx Denied for Micronor, [facility] for 1 pack x 11 refills 02/02/13 left pt a voice message. cm

## 2013-02-05 NOTE — Telephone Encounter (Signed)
Patient is asking for a refill of [facility] [place]

## 2013-02-05 NOTE — Telephone Encounter (Signed)
Rx denied looks like a Rx was sent to [facility] 02/02/13 Micronor 1 pack x 11 refills cm

## 2013-02-27 ENCOUNTER — Ambulatory Visit (INDEPENDENT_AMBULATORY_CARE_PROVIDER_SITE_OTHER): Payer: Managed Care, Other (non HMO) | Admitting: General Practice

## 2013-02-27 DIAGNOSIS — Z7901 Long term (current) use of anticoagulants: Secondary | ICD-10-CM

## 2013-02-27 DIAGNOSIS — I359 Nonrheumatic aortic valve disorder, unspecified: Secondary | ICD-10-CM

## 2013-02-27 DIAGNOSIS — Z954 Presence of other heart-valve replacement: Secondary | ICD-10-CM

## 2013-02-27 LAB — POCT INR: INR: 2.7

## 2013-03-08 ENCOUNTER — Other Ambulatory Visit: Payer: Self-pay

## 2013-03-08 DIAGNOSIS — Z1231 Encounter for screening mammogram for malignant neoplasm of breast: Secondary | ICD-10-CM

## 2013-03-22 ENCOUNTER — Other Ambulatory Visit: Payer: Self-pay

## 2013-03-27 ENCOUNTER — Ambulatory Visit (INDEPENDENT_AMBULATORY_CARE_PROVIDER_SITE_OTHER): Payer: Managed Care, Other (non HMO) | Admitting: General Practice

## 2013-03-27 DIAGNOSIS — Z7901 Long term (current) use of anticoagulants: Secondary | ICD-10-CM

## 2013-03-27 DIAGNOSIS — Z954 Presence of other heart-valve replacement: Secondary | ICD-10-CM

## 2013-03-27 DIAGNOSIS — I359 Nonrheumatic aortic valve disorder, unspecified: Secondary | ICD-10-CM

## 2013-04-05 ENCOUNTER — Encounter: Payer: Self-pay | Admitting: Cardiovascular Disease

## 2013-04-05 ENCOUNTER — Ambulatory Visit (INDEPENDENT_AMBULATORY_CARE_PROVIDER_SITE_OTHER): Payer: Managed Care, Other (non HMO) | Admitting: Cardiovascular Disease

## 2013-04-05 VITALS — BP 127/72 | HR 68 | Ht 61.0 in | Wt 158.0 lb

## 2013-04-05 DIAGNOSIS — I359 Nonrheumatic aortic valve disorder, unspecified: Secondary | ICD-10-CM

## 2013-04-05 DIAGNOSIS — Z7901 Long term (current) use of anticoagulants: Secondary | ICD-10-CM

## 2013-04-05 NOTE — Assessment & Plan Note (Signed)
Stable with no bleeding F/U clinic

## 2013-04-05 NOTE — Progress Notes (Signed)
Patient ID: Vanessa Owens, female   DOB: 1960/10/16, 52 y.o.   MRN: [id number] Vanessa Owens returns today for F/U post AVR in 2001 for bicuspid valve. Previously followed by Dr Fraser Din. Last echo reviewed from 09/29/10 with mild LVH, normal EF, mean gradient across AVR 58mmHg/peak and mild AR. Needs a root canal. SBE prophylaxis discussed. Complained about facility fee with last echo. May try to have it at [facility] next year.  Echo 09/29/10  Study Conclusions  - Left ventricle: The cavity size was normal. Wall thickness was increased in a pattern of mild LVH. The estimated ejection fraction was 65%. Wall motion was normal; there were no regional wall motion abnormalities. - Aortic valve: By history there is a St. Jude mechanical aortic valve. The gradients now are higher than 2010. It seems to me that we may not be seeing a crisp image of the disc motion. Iam not able to access the prior echos for comparison. Mean gradient: 26mm Hg (S). Peak gradient: 51mm Hg (S). - Aorta: Aortic root dimension: 31mm (ED). Ascending aortic diameter: 38mm (S). - Tricuspid valve: Mild regurgitation.  Some atypical left breast pain. Some dizzyness that she has had before Not postural. INR;s been Rx Had root canal about a month ago and had SBE   Needs a primary care doctor in [place] office   ROS: Denies fever, malais, weight loss, blurry vision, decreased visual acuity, cough, sputum, SOB, hemoptysis, pleuritic pain, palpitaitons, heartburn, abdominal pain, melena, lower extremity edema, claudication, or rash.  All other systems reviewed and negative  General: Affect appropriate Healthy:  appears stated age HEENT: normal Neck supple with no adenopathy JVP normal no bruits no thyromegaly Lungs clear with no wheezing and good diaphragmatic motion Heart:  S1/S2 click SEM no AR murmur, no rub, gallop or click PMI normal Abdomen: benighn, BS positve, no tenderness, no AAA no bruit.  No HSM or HJR Distal  pulses intact with no bruits No edema Neuro non-focal Skin warm and dry No muscular weakness   Current Outpatient Prescriptions  Medication Sig Dispense Refill  . amoxicillin (AMOXIL) 500 MG capsule Take 1 tablet by mouth. Prior to dental procedures      . Calcium Carbonate-Vit D-Min (CALTRATE PLUS PO) Take by mouth daily.        . Multiple Vitamin (MULTIVITAMIN) capsule Take 2 capsules by mouth daily.       . norethindrone (MICRONOR,CAMILA,ERRIN) 0.35 MG tablet Take 1 tablet (0.35 mg total) by mouth daily.  1 Package  11  . warfarin (COUMADIN) 7.5 MG tablet Take as directed by Anticoagulation clinic  90 tablet  1   No current facility-administered medications for this visit.    Allergies  Morphine  Electrocardiogram:  09/19/12 SR rate 70 normal ECG  Assessment and Plan

## 2013-04-05 NOTE — Patient Instructions (Signed)
Your physician wants you to follow-up in:   6 MONTHS WITH DR Haywood Filler will receive a reminder letter in the mail two months in advance. If you don't receive a letter, please call our office to schedule the follow-up appointment. Your physician recommends that you continue on your current medications as directed. Please refer to the Current Medication list given to you today.  You have been referred to [facility] OFFICE  PMD

## 2013-04-05 NOTE — Assessment & Plan Note (Signed)
13 years post AVR No issues. Anticoagulation ok SBE ok  No change in murmur.  NO AR  Stable

## 2013-04-09 ENCOUNTER — Ambulatory Visit
Admission: RE | Admit: 2013-04-09 | Discharge: 2013-04-09 | Disposition: A | Discharge status: Home / Self Care | Payer: Managed Care, Other (non HMO) | Source: Ambulatory Visit

## 2013-04-09 DIAGNOSIS — Z1231 Encounter for screening mammogram for malignant neoplasm of breast: Secondary | ICD-10-CM

## 2013-04-24 ENCOUNTER — Ambulatory Visit (INDEPENDENT_AMBULATORY_CARE_PROVIDER_SITE_OTHER): Payer: Managed Care, Other (non HMO) | Admitting: *Deleted

## 2013-04-24 DIAGNOSIS — Z7901 Long term (current) use of anticoagulants: Secondary | ICD-10-CM

## 2013-04-24 DIAGNOSIS — I359 Nonrheumatic aortic valve disorder, unspecified: Secondary | ICD-10-CM

## 2013-04-24 DIAGNOSIS — Z954 Presence of other heart-valve replacement: Secondary | ICD-10-CM

## 2013-04-24 MED ORDER — WARFARIN SODIUM 7.5 MG PO TABS: ORAL_TABLET | ORAL | Status: DC

## 2013-05-29 ENCOUNTER — Ambulatory Visit (INDEPENDENT_AMBULATORY_CARE_PROVIDER_SITE_OTHER): Payer: Managed Care, Other (non HMO)

## 2013-05-29 DIAGNOSIS — I359 Nonrheumatic aortic valve disorder, unspecified: Secondary | ICD-10-CM

## 2013-05-29 DIAGNOSIS — Z954 Presence of other heart-valve replacement: Secondary | ICD-10-CM

## 2013-05-29 DIAGNOSIS — Z7901 Long term (current) use of anticoagulants: Secondary | ICD-10-CM

## 2013-05-29 LAB — POCT INR: INR: 2.7

## 2013-06-27 ENCOUNTER — Telehealth: Payer: Self-pay

## 2013-06-27 NOTE — Telephone Encounter (Signed)
Unable to reach prior to visit LM for patient about appt.

## 2013-06-28 ENCOUNTER — Encounter: Payer: Self-pay | Admitting: Family Medicine

## 2013-06-28 ENCOUNTER — Encounter: Payer: Self-pay | Admitting: *Deleted

## 2013-06-28 ENCOUNTER — Ambulatory Visit (INDEPENDENT_AMBULATORY_CARE_PROVIDER_SITE_OTHER): Payer: Managed Care, Other (non HMO) | Admitting: Family Medicine

## 2013-06-28 VITALS — BP 118/80 | HR 72 | Temp 98.2°F | Ht 60.5 in | Wt 159.0 lb

## 2013-06-28 DIAGNOSIS — Z833 Family history of diabetes mellitus: Secondary | ICD-10-CM

## 2013-06-28 DIAGNOSIS — Z0000 Encounter for general adult medical examination without abnormal findings: Secondary | ICD-10-CM

## 2013-06-28 DIAGNOSIS — M899 Disorder of bone, unspecified: Secondary | ICD-10-CM

## 2013-06-28 DIAGNOSIS — M949 Disorder of cartilage, unspecified: Secondary | ICD-10-CM

## 2013-06-28 DIAGNOSIS — E669 Obesity, unspecified: Secondary | ICD-10-CM | POA: Insufficient documentation

## 2013-06-28 DIAGNOSIS — M8580 Other specified disorders of bone density and structure, unspecified site: Secondary | ICD-10-CM

## 2013-06-28 LAB — POCT URINALYSIS DIPSTICK
BILIRUBIN UA: NEGATIVE
Blood, UA: NEGATIVE
Glucose, UA: NEGATIVE
KETONES UA: NEGATIVE
LEUKOCYTES UA: NEGATIVE
Nitrite, UA: NEGATIVE
PH UA: 7.5
PROTEIN UA: NEGATIVE
SPEC GRAV UA: 1.01
Urobilinogen, UA: 0.2

## 2013-06-28 LAB — TSH: TSH: 2.61 u[IU]/mL (ref 0.35–5.50)

## 2013-06-28 LAB — CBC WITH DIFFERENTIAL/PLATELET
BASOS ABS: 0 10*3/uL (ref 0.0–0.1)
Basophils Relative: 0.4 % (ref 0.0–3.0)
EOS ABS: 0.1 10*3/uL (ref 0.0–0.7)
Eosinophils Relative: 2.3 % (ref 0.0–5.0)
HEMATOCRIT: 42.5 % (ref 36.0–46.0)
Hemoglobin: 14.1 g/dL (ref 12.0–15.0)
LYMPHS ABS: 1.4 10*3/uL (ref 0.7–4.0)
Lymphocytes Relative: 30 % (ref 12.0–46.0)
MCHC: 33.3 g/dL (ref 30.0–36.0)
MCV: 95.3 fl (ref 78.0–100.0)
MONO ABS: 0.4 10*3/uL (ref 0.1–1.0)
MONOS PCT: 8.5 % (ref 3.0–12.0)
NEUTROS ABS: 2.7 10*3/uL (ref 1.4–7.7)
Neutrophils Relative %: 58.8 % (ref 43.0–77.0)
PLATELETS: 160 10*3/uL (ref 150.0–400.0)
RBC: 4.46 Mil/uL (ref 3.87–5.11)
RDW: 12.9 % (ref 11.5–14.6)
WBC: 4.7 10*3/uL (ref 4.5–10.5)

## 2013-06-28 LAB — LIPID PANEL
CHOLESTEROL: 197 mg/dL (ref 0–200)
HDL: 45 mg/dL (ref >39.00)
LDL Cholesterol: 136 mg/dL — ABNORMAL HIGH (ref 0–99)
TRIGLYCERIDES: 81 mg/dL (ref 0.0–149.0)
Total CHOL/HDL Ratio: 4
VLDL: 16.2 mg/dL (ref 0.0–40.0)

## 2013-06-28 LAB — BASIC METABOLIC PANEL
BUN: 15 mg/dL (ref 6–23)
CO2: 30 meq/L (ref 19–32)
Calcium: 9.2 mg/dL (ref 8.4–10.5)
Chloride: 105 mEq/L (ref 96–112)
Creatinine, Ser: 0.8 mg/dL (ref 0.4–1.2)
GFR: 81.44 mL/min (ref >60.00)
GLUCOSE: 88 mg/dL (ref 70–99)
POTASSIUM: 3.9 meq/L (ref 3.5–5.1)
Sodium: 141 mEq/L (ref 135–145)

## 2013-06-28 LAB — HEPATIC FUNCTION PANEL
ALBUMIN: 4.4 g/dL (ref 3.5–5.2)
ALT: 28 U/L (ref 0–35)
AST: 30 U/L (ref 0–37)
Alkaline Phosphatase: 71 U/L (ref 39–117)
Bilirubin, Direct: 0.1 mg/dL (ref 0.0–0.3)
TOTAL PROTEIN: 7.6 g/dL (ref 6.0–8.3)
Total Bilirubin: 1.6 mg/dL — ABNORMAL HIGH (ref 0.3–1.2)

## 2013-06-28 LAB — HEMOGLOBIN A1C: HEMOGLOBIN A1C: 5.2 % (ref 4.6–6.5)

## 2013-06-28 NOTE — Patient Instructions (Signed)

## 2013-06-28 NOTE — Progress Notes (Signed)
Pre visit review using our clinic review tool, if applicable. No additional management support is needed unless otherwise documented below in the visit note. 

## 2013-06-28 NOTE — Assessment & Plan Note (Signed)
Calcium 1200-1500 mg daily Vita D 3

## 2013-06-28 NOTE — Progress Notes (Signed)
l Subjective:     Vanessa Owens is a 53 y.o. female and is here for a comprehensive physical exam. The patient reports no problems.  History   Social History  . Marital Status: Single    Spouse Name: N/A    Number of Children: 2  . Years of Education: N/A   Occupational History  . benefits specialist [employer]   Social History Main Topics  . Smoking status: Never Smoker   . Smokeless tobacco: Never Used  . Alcohol Use: .5 - 1 oz/week    1-2 drink(s) per week     Comment: occ  . Drug Use: No  . Sexual Activity: Yes    Partners: Male    Birth Control/ Protection: Pill   Other Topics Concern  . Not on file   Social History Narrative  . No narrative on file   Health Maintenance  Topic Date Due  . Tetanus/tdap  02/28/1981  . Colonoscopy  02/29/2012  . Influenza Vaccine  12/15/2013  . Pap Smear  01/16/2015  . Mammogram  04/10/2015    The following portions of the patient's history were reviewed and updated as appropriate:  She  has a past medical history of Aortic stenosis; S/P AVR; Hypertriglyceridemia; CHEST PAIN UNSPECIFIED; Long term current use of anticoagulant; Shortness of breath; Anemia; Vulvar ulceration; OAB (overactive bladder); Osteopenia; Migraine; Costochondritis; Shingles; and Rash. She  does not have any pertinent problems on file. She  has past surgical history that includes Aortic valve replacement (2001); Cesarean section; Appendectomy; and Breast biopsy (5/08). Her family history includes Alzheimer's disease in her maternal grandfather; Diabetes in her brother, mother, other, and son; Hyperlipidemia in her brother and mother; Hypertension in her brother and mother. She  reports that she has never smoked. She has never used smokeless tobacco. She reports that she drinks about 0.5 ounces of alcohol per week. She reports that she does not use illicit drugs. She has a current medication list which includes the following prescription(s): amoxicillin, calcium  carbonate-vit d-min, multivitamin, norethindrone, and warfarin. Current Outpatient Prescriptions on File Prior to Visit  Medication Sig Dispense Refill  . amoxicillin (AMOXIL) 500 MG capsule Take 1 tablet by mouth. Prior to dental procedures      . Calcium Carbonate-Vit D-Min (CALTRATE PLUS PO) Take by mouth daily.        . Multiple Vitamin (MULTIVITAMIN) capsule Take 2 capsules by mouth daily.       . norethindrone (MICRONOR,CAMILA,ERRIN) 0.35 MG tablet Take 1 tablet (0.35 mg total) by mouth daily.  1 Package  11  . warfarin (COUMADIN) 7.5 MG tablet Take as directed by Anticoagulation clinic  90 tablet  1   No current facility-administered medications on file prior to visit.   She is allergic to morphine..  Review of Systems Review of Systems  Constitutional: Negative for activity change, appetite change and fatigue.  HENT: Negative for hearing loss, congestion, tinnitus and ear discharge.  dentist q24m Eyes: Negative for visual disturbance (see optho q1y -- vision corrected to 20/20 with glasses).  Respiratory: Negative for cough, chest tightness and shortness of breath.   Cardiovascular: Negative for chest pain, palpitations and leg swelling.  Gastrointestinal: Negative for abdominal pain, diarrhea, constipation and abdominal distention.  Genitourinary: Negative for urgency, frequency, decreased urine volume and difficulty urinating.  Musculoskeletal: Negative for back pain, arthralgias and gait problem.  Skin: Negative for color change, pallor and rash.  Neurological: Negative for dizziness, light-headedness, numbness and headaches.  Hematological: Negative for  adenopathy. Does not bruise/bleed easily.  Psychiatric/Behavioral: Negative for suicidal ideas, confusion, sleep disturbance, self-injury, dysphoric mood, decreased concentration and agitation.       Objective:    BP 118/80  Pulse 72  Temp(Src) 98.2 F (36.8 C) (Oral)  Ht 5' 0.5" (1.537 m)  Wt 159 lb (72.122 kg)  BMI  30.53 kg/m2  SpO2 96% General appearance: alert, cooperative, appears stated age and no distress Head: Normocephalic, without obvious abnormality, atraumatic Eyes: conjunctivae/corneas clear. PERRL, EOM's intact. Fundi benign. Ears: normal TM's and external ear canals both ears Nose: Nares normal. Septum midline. Mucosa normal. No drainage or sinus tenderness. Throat: lips, mucosa, and tongue normal; teeth and gums normal Neck: no adenopathy, no carotid bruit, no JVD, supple, symmetrical, trachea midline and thyroid not enlarged, symmetric, no tenderness/mass/nodules Back: symmetric, no curvature. ROM normal. No CVA tenderness. Lungs: clear to auscultation bilaterally Breasts: gyn Heart: S1, S2 normal and murmur Abdomen: soft, non-tender; bowel sounds normal; no masses,  no organomegaly Pelvic: deferred Extremities: extremities normal, atraumatic, no cyanosis or edema Pulses: 2+ and symmetric Skin: Skin color, texture, turgor normal. No rashes or lesions Lymph nodes: Cervical, supraclavicular, and axillary nodes normal. Neurologic: Alert and oriented X 3, normal strength and tone. Normal symmetric reflexes. Normal coordination and gait Psych-- no depression,, no anxiety      Assessment:    Healthy female exam.      Plan:    ghm utd Check labs See After Visit Summary for Counseling Recommendations   1. Preventative health care  - Ambulatory referral to Gastroenterology - Basic metabolic panel - CBC with Differential - Hemoglobin A1c - Hepatic function panel - Lipid panel - POCT urinalysis dipstick - TSH  2. Family history of diabetes mellitus Check labs - Hemoglobin A1c  3. Osteopenia Due for bmd Ca and vita d  4.  Hx aortic stenosis-- hx valve replacement        On coumadin

## 2013-07-20 ENCOUNTER — Ambulatory Visit (INDEPENDENT_AMBULATORY_CARE_PROVIDER_SITE_OTHER): Payer: Managed Care, Other (non HMO) | Admitting: *Deleted

## 2013-07-20 DIAGNOSIS — Z954 Presence of other heart-valve replacement: Secondary | ICD-10-CM

## 2013-07-20 DIAGNOSIS — I359 Nonrheumatic aortic valve disorder, unspecified: Secondary | ICD-10-CM

## 2013-07-20 DIAGNOSIS — Z7901 Long term (current) use of anticoagulants: Secondary | ICD-10-CM

## 2013-07-20 LAB — POCT INR: INR: 2.1

## 2013-07-31 ENCOUNTER — Encounter: Payer: Self-pay | Admitting: Family Medicine

## 2013-08-03 ENCOUNTER — Ambulatory Visit (INDEPENDENT_AMBULATORY_CARE_PROVIDER_SITE_OTHER): Payer: Managed Care, Other (non HMO) | Admitting: *Deleted

## 2013-08-03 ENCOUNTER — Other Ambulatory Visit: Payer: Self-pay | Admitting: *Deleted

## 2013-08-03 DIAGNOSIS — Z954 Presence of other heart-valve replacement: Secondary | ICD-10-CM

## 2013-08-03 DIAGNOSIS — I359 Nonrheumatic aortic valve disorder, unspecified: Secondary | ICD-10-CM

## 2013-08-03 DIAGNOSIS — Z7901 Long term (current) use of anticoagulants: Secondary | ICD-10-CM

## 2013-08-03 LAB — POCT INR: INR: 2.9

## 2013-08-03 MED ORDER — WARFARIN SODIUM 7.5 MG PO TABS: ORAL_TABLET | ORAL | Status: DC

## 2013-08-29 ENCOUNTER — Ambulatory Visit (INDEPENDENT_AMBULATORY_CARE_PROVIDER_SITE_OTHER): Payer: Managed Care, Other (non HMO) | Admitting: *Deleted

## 2013-08-29 DIAGNOSIS — Z954 Presence of other heart-valve replacement: Secondary | ICD-10-CM

## 2013-08-29 DIAGNOSIS — Z7901 Long term (current) use of anticoagulants: Secondary | ICD-10-CM

## 2013-08-29 DIAGNOSIS — I359 Nonrheumatic aortic valve disorder, unspecified: Secondary | ICD-10-CM

## 2013-08-29 LAB — POCT INR: INR: 2.5

## 2013-09-26 ENCOUNTER — Ambulatory Visit (INDEPENDENT_AMBULATORY_CARE_PROVIDER_SITE_OTHER): Payer: Managed Care, Other (non HMO) | Admitting: *Deleted

## 2013-09-26 DIAGNOSIS — Z954 Presence of other heart-valve replacement: Secondary | ICD-10-CM

## 2013-09-26 DIAGNOSIS — Z7901 Long term (current) use of anticoagulants: Secondary | ICD-10-CM

## 2013-09-26 DIAGNOSIS — I359 Nonrheumatic aortic valve disorder, unspecified: Secondary | ICD-10-CM

## 2013-09-26 LAB — POCT INR: INR: 2.8

## 2013-10-05 ENCOUNTER — Ambulatory Visit (INDEPENDENT_AMBULATORY_CARE_PROVIDER_SITE_OTHER): Payer: Managed Care, Other (non HMO) | Admitting: Cardiovascular Disease

## 2013-10-05 ENCOUNTER — Encounter: Payer: Self-pay | Admitting: Cardiovascular Disease

## 2013-10-05 VITALS — BP 120/78 | HR 81 | Ht 60.5 in | Wt 148.1 lb

## 2013-10-05 DIAGNOSIS — Z954 Presence of other heart-valve replacement: Secondary | ICD-10-CM

## 2013-10-05 DIAGNOSIS — Z7901 Long term (current) use of anticoagulants: Secondary | ICD-10-CM

## 2013-10-05 DIAGNOSIS — I359 Nonrheumatic aortic valve disorder, unspecified: Secondary | ICD-10-CM

## 2013-10-05 DIAGNOSIS — Z952 Presence of prosthetic heart valve: Secondary | ICD-10-CM

## 2013-10-05 NOTE — Assessment & Plan Note (Signed)
No bleeding issues INR been Rx  Lovenox for colonoscopy

## 2013-10-05 NOTE — Patient Instructions (Signed)

## 2013-10-05 NOTE — Progress Notes (Signed)
Patient ID: Vanessa Owens, female   DOB: 1960/08/04, 53 y.o.   MRN: [id number] Vanessa Owens returns today for F/U post AVR in 2001 for bicuspid valve. Previously followed by Dr [name]. Last echo reviewed from 09/29/10 with mild LVH, normal EF, mean gradient across AVR 81mmHg/peak 50mmHg and mild AR. Needs a root canal. SBE prophylaxis discussed. Complained about facility fee with last echo. May try to have it at [facility] next year.  Echo 09/29/10  Study Conclusions  - Left ventricle: The cavity size was normal. Wall thickness was increased in a pattern of mild LVH. The estimated ejection fraction was 65%. Wall motion was normal; there were no regional wall motion abnormalities. - Aortic valve: By history there is a St. Jude mechanical aortic valve. The gradients now are higher than 2010. It seems to me that we may not be seeing a crisp image of the disc motion. Iam not able to access the prior echos for comparison. Mean gradient: 70mm Hg (S). Peak gradient: 36mm Hg (S). - Aorta: Aortic root dimension: 35mm (ED). Ascending aortic diameter: 42mm (S). - Tricuspid valve: Mild regurgitation.  Some atypical left breast pain. Some dizzyness that she has had before Not postural. INR;s been Rx Had root canal about a month ago and had SBE  Needs a primary care doctor in [place] office  Needs colonoscopy Will call coumadin clinic to arrange lovenox when scheduled.  Needs f/u echo Discussed issues with roller coaster rides and scuba Diving which I advised against  ROS: Denies fever, malais, weight loss, blurry vision, decreased visual acuity, cough, sputum, SOB, hemoptysis, pleuritic pain, palpitaitons, heartburn, abdominal pain, melena, lower extremity edema, claudication, or rash.  All other systems reviewed and negative  General: Affect appropriate Healthy:  appears stated age 35: normal Neck supple with no adenopathy JVP normal no bruits no thyromegaly Lungs clear with no wheezing and good  diaphragmatic motion Heart:  I0/X6 click  SEM no AR  murmur, no rub, gallop or click PMI normal Abdomen: benighn, BS positve, no tenderness, no AAA no bruit.  No HSM or HJR Distal pulses intact with no bruits No edema Neuro non-focal Skin warm and dry No muscular weakness   Current Outpatient Prescriptions  Medication Sig Dispense Refill  . amoxicillin (AMOXIL) 500 MG capsule Take 1 tablet by mouth. Prior to dental procedures      . Calcium Carbonate-Vit D-Min (CALTRATE PLUS PO) Take by mouth daily.        . Multiple Vitamin (MULTIVITAMIN) capsule Take 2 capsules by mouth daily.       . norethindrone (MICRONOR,CAMILA,ERRIN) 0.35 MG tablet Take 1 tablet (0.35 mg total) by mouth daily.  1 Package  11  . warfarin (COUMADIN) 7.5 MG tablet Take as directed by Anticoagulation clinic  90 tablet  1   No current facility-administered medications for this visit.    Allergies  Morphine  Electrocardiogram: 5/14  SR normal   Assessment and Plan

## 2013-10-05 NOTE — Assessment & Plan Note (Signed)
No change in exam  F/u echo as last gradients a bit higher  Would be nice if they measured AT, as well as peak velocity and EOA SBE prophylaxis  Will call coumadin clinic to arrange lovenox prior to colonoscopy

## 2013-10-15 ENCOUNTER — Encounter: Payer: Self-pay | Admitting: Gastroenterology

## 2013-10-19 ENCOUNTER — Ambulatory Visit (INDEPENDENT_AMBULATORY_CARE_PROVIDER_SITE_OTHER): Payer: Managed Care, Other (non HMO) | Admitting: Gastroenterology

## 2013-10-19 ENCOUNTER — Telehealth: Payer: Self-pay | Admitting: *Deleted

## 2013-10-19 ENCOUNTER — Encounter: Payer: Self-pay | Admitting: Gastroenterology

## 2013-10-19 ENCOUNTER — Telehealth: Payer: Self-pay | Admitting: Pharmacist

## 2013-10-19 VITALS — BP 98/72 | HR 68 | Ht 60.25 in | Wt 146.0 lb

## 2013-10-19 DIAGNOSIS — Z7901 Long term (current) use of anticoagulants: Secondary | ICD-10-CM | POA: Insufficient documentation

## 2013-10-19 DIAGNOSIS — Z1211 Encounter for screening for malignant neoplasm of colon: Secondary | ICD-10-CM | POA: Insufficient documentation

## 2013-10-19 MED ORDER — NA SULFATE-K SULFATE-MG SULF 17.5-3.13-1.6 GM/177ML PO SOLN: ORAL | Status: DC

## 2013-10-19 NOTE — Telephone Encounter (Signed)
10/19/2013   RE: GRISSEL TYRELL DOB: 03/16/1961 MRN: [id number]   Dear Dr. Johnsie Cancel,    We have scheduled the above patient for a Colonscopy. Our records show that she is on anticoagulation therapy.   Please advise as to how long the patient may come off her therapy of Coumadin prior to the procedure, which is scheduled for 11-22-2013 .  Please fax back/ or route the completed form to Evette Georges., CMA.   Sincerely,    Carlyle Dolly

## 2013-10-19 NOTE — Progress Notes (Signed)
10/19/2013 Vanessa Owens [id number] 1960/06/14   HISTORY OF PRESENT ILLNESS:  Patient is a pleasant 53 year old female who is knew to our practice and comes in to schedule a screening colonoscopy.  Never had one in the past.  She had AVR in 2001 and is on coumadin for that.  Usually is bridged to coumadin by Vanessa Owens clinic.  She denies any rectal bleeding.  Always had experienced a lot of gas/flatulence and some loose stools, but has been following FODMAP diet and feels much better when she does that.   Past Medical History  Diagnosis Date  . Aortic stenosis      by Vanessa Owens  . 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 Hypertriglyceridemia   . CHEST PAIN UNSPECIFIED     Qualifier: Diagnosis of  By: Vanessa Owens    . Long term current use of anticoagulant   . Shortness of breath     Qualifier: Diagnosis of  By: Vanessa Owens    . Anemia   . Vulvar ulceration     recurrent  . OAB (overactive bladder)   . Osteopenia   . Migraine   . Costochondritis     x2  . Shingles     ? diag/rash 11/21/12  . Rash    Past Surgical History  Procedure Laterality Date  . Aortic valve replacement  2001    St Jude  . Cesarean section    . Appendectomy      post op abdominal wall hematoma/2 transfusions  . Breast biopsy  5/08    left breast    reports that she has never smoked. She has never used smokeless tobacco. She reports that she drinks about .5 - 1 ounces of alcohol per week. She reports that she does not use illicit drugs. family history includes Alzheimer's disease in her maternal grandfather; Diabetes in her brother, mother, other, and son; Hyperlipidemia in her brother and mother; Hypertension in her brother and mother. Allergies  Allergen Reactions  . Morphine     REACTION: sick      Outpatient Encounter Prescriptions as of 10/19/2013  Medication Sig  . amoxicillin (AMOXIL) 500 MG capsule Take 1 tablet by mouth. Prior to dental  procedures  . Calcium Carbonate-Vitamin D (CALCIUM-VITAMIN D) 500-200 MG-UNIT per tablet Take 1 tablet by mouth daily. Vitafusion- Take 2 gummies  . norethindrone (MICRONOR,CAMILA,ERRIN) 0.35 MG tablet Take 1 tablet (0.35 mg total) by mouth daily.  Marland Kitchen warfarin (COUMADIN) 7.5 MG tablet Take as directed by Anticoagulation clinic  . [DISCONTINUED] Calcium Carbonate-Vit D-Min (CALTRATE PLUS PO) Take by mouth daily.    . [DISCONTINUED] Multiple Vitamin (MULTIVITAMIN) capsule Take 2 capsules by mouth daily.      REVIEW OF SYSTEMS  : All other systems reviewed and negative except where noted in the History of Present Illness.   PHYSICAL EXAM: BP 98/72  Pulse 68  Ht 5' 0.25" (1.53 m)  Wt 146 lb (66.225 kg)  BMI 28.29 kg/m2 General: Well developed white female in no acute distress Head: Normocephalic and atraumatic Eyes:  Sclerae anicteric, conjunctiva pink. Ears: Normal auditory acuity  Lungs: Clear throughout to auscultation Heart: Regular rate and rhythm.  Valve click noted. Abdomen: Soft, non-distended.  BS present.  Non-tender. Rectal:  Deferred.  Will be done at the time of colonoscopy. Musculoskeletal: Symmetrical with no gross deformities  Skin: No lesions on visible extremities Extremities: No edema  Neurological:  Alert oriented x 4, grossly non-focal Psychological:  Alert and cooperative. Normal mood and affect  ASSESSMENT AND PLAN: -Screening colonoscopy:  Will schedule with Dr. [name].  She is on coumadin for AVR, which is prescribed by Dr. Johnsie Cancel.  Will likely need to be bridged to Lovenox.  The risks, benefits, and alternatives were discussed with the patient and she consents to proceed.  The risks benefits and alternatives to a temporary hold of anti-coagulants/anti-platelets for the procedure were discussed with the patient she consents to proceed. Obtain clearance from Nishan.

## 2013-10-19 NOTE — Progress Notes (Signed)
Reviewed and agree with management. Prabhnoor Ellenberger D. Josetta Wigal, M.D., FACG  

## 2013-10-19 NOTE — Telephone Encounter (Signed)
Okay to hold for colonoscopy and bridge with Lovenox per Dr. Johnsie Cancel.  Will discuss with patient next week during protime appointment.

## 2013-10-19 NOTE — Patient Instructions (Signed)
You have been scheduled for a colonoscopy with propofol. Please follow written instructions given to you at your visit today.  Please pick up your prep kit at the pharmacy within the next 1-3 days. If you use inhalers (even only as needed), please bring them with you on the day of your procedure. Your physician has requested that you go to www.startemmi.com and enter the access code given to you at your visit today. This web site gives a general overview about your procedure. However, you should still follow specific instructions given to you by our office regarding your preparation for the procedure.  You will be contaced by our office prior to your procedure for directions on holding your Coumadin/Warfarin.  If you do not hear from our office 1 week prior to your scheduled procedure, please call [phone] to discuss.  

## 2013-10-22 NOTE — Telephone Encounter (Signed)
NOTED ./CY 

## 2013-10-22 NOTE — Telephone Encounter (Signed)
She is ok for colonoscopy and can arrange lovenox through coumadin clinic ----- Message ----- From: Carlyle Dolly, CMA Sent: 10/19/2013 9:13 AM To: Josue Hector, MD   I called patient and advised Dr. Johnsie Cancel advise about doing lovenox injections Patient stated that she has an appointment with Dr. Johnsie Cancel tomorrow and will talk to him about lovenox injections

## 2013-10-23 ENCOUNTER — Ambulatory Visit (HOSPITAL_COMMUNITY): Payer: Managed Care, Other (non HMO) | Attending: Cardiovascular Disease | Admitting: Radiology

## 2013-10-23 ENCOUNTER — Ambulatory Visit (INDEPENDENT_AMBULATORY_CARE_PROVIDER_SITE_OTHER): Payer: Managed Care, Other (non HMO) | Admitting: Pharmacist

## 2013-10-23 DIAGNOSIS — Z7901 Long term (current) use of anticoagulants: Secondary | ICD-10-CM

## 2013-10-23 DIAGNOSIS — Z952 Presence of prosthetic heart valve: Secondary | ICD-10-CM

## 2013-10-23 DIAGNOSIS — I359 Nonrheumatic aortic valve disorder, unspecified: Secondary | ICD-10-CM

## 2013-10-23 DIAGNOSIS — Z954 Presence of other heart-valve replacement: Secondary | ICD-10-CM

## 2013-10-23 LAB — POCT INR: INR: 2.7

## 2013-10-23 NOTE — Progress Notes (Signed)
Echocardiogram performed.  

## 2013-10-24 ENCOUNTER — Encounter: Payer: Self-pay | Admitting: Gastroenterology

## 2013-11-15 ENCOUNTER — Ambulatory Visit (INDEPENDENT_AMBULATORY_CARE_PROVIDER_SITE_OTHER): Payer: Managed Care, Other (non HMO) | Admitting: *Deleted

## 2013-11-15 DIAGNOSIS — Z954 Presence of other heart-valve replacement: Secondary | ICD-10-CM

## 2013-11-15 DIAGNOSIS — Z7901 Long term (current) use of anticoagulants: Secondary | ICD-10-CM

## 2013-11-15 DIAGNOSIS — I359 Nonrheumatic aortic valve disorder, unspecified: Secondary | ICD-10-CM

## 2013-11-15 LAB — POCT INR: INR: 2.8

## 2013-11-15 MED ORDER — ENOXAPARIN SODIUM 60 MG/0.6ML ~~LOC~~ SOLN: 60.0000 mg | Freq: Two times a day (BID) | SUBCUTANEOUS | Status: DC

## 2013-11-15 NOTE — Patient Instructions (Signed)
11/16/2013 Last day to take coumadin  11/17/2013 no coumadin no Lovenox 11/18/2013 no coumadin lovenox 60 mg am and Lovenox 60 mg pm 12 hours apart 11/19/2013 no coumadin Lovenox 60mg  am and Lovenox 60 mg pm 11/20/2013 no coumadn Lovenox 60 mg am and Lovenox 60 mg pm 11/21/2013 no coumadin Lovenox 60 mg am only take in morning no lovenox in the evening 11/22/2013  No coumadin  No Lovenox day of procedure then when MD doing procedure instructs to restart coumadin and lovenox restart at same dose of lovenox and coumadin except for coumadin take extra 1/2 tablet for 2 days and continue coumadin and Lovenox until seen on clinic on Jluy 14th

## 2013-11-19 ENCOUNTER — Telehealth: Payer: Self-pay | Admitting: Gastroenterology

## 2013-11-19 NOTE — Telephone Encounter (Signed)
Called patient back. Patient had questions about letter that she received regarding her billing for Colonoscopy. Patient wanted to inform us that she is being bridged for her procedure and that she is ready to proceed with procedure on 7-9.  I advised patient to call back tomorrow morning and ask to speak to [facility] she is our Administrator, arts and that is who sends the letters out. Patient verbalized understanding.

## 2013-11-21 ENCOUNTER — Telehealth: Payer: Self-pay | Admitting: Gastroenterology

## 2013-11-21 NOTE — Telephone Encounter (Signed)
Returned patient's call and she is concerned that her urine is rust colored. It started this afternoon.  She stopped her coumadin on July 3rd and started Lovenox on July 5th.   I spoke with Jerilee Field, RN regarding this and she advised that I have patient to call Dr. Kyla Balzarine office and let them be aware of this since he is the one who sees her for the coumadin/Lovenox.  I advised her also that I would send this to Dr. [name] so he will be aware of this.  I asked her to let us know if Dr. Johnsie Cancel wants her to do anything differently and if we did not hear from her, then we would know that everything is clear for her to come tomorrow for her procedure.  She agreed and will call Dr. Kyla Balzarine office this evening prior to starting her 5 o'clock prep.

## 2013-11-22 ENCOUNTER — Other Ambulatory Visit: Payer: Managed Care, Other (non HMO)

## 2013-11-22 ENCOUNTER — Ambulatory Visit (AMBULATORY_SURGERY_CENTER): Payer: Managed Care, Other (non HMO) | Admitting: Gastroenterology

## 2013-11-22 ENCOUNTER — Other Ambulatory Visit: Payer: Self-pay | Admitting: Gastroenterology

## 2013-11-22 ENCOUNTER — Encounter: Payer: Self-pay | Admitting: Gastroenterology

## 2013-11-22 VITALS — BP 139/76 | HR 57 | Temp 99.2°F | Resp 20 | Ht 60.0 in | Wt 146.0 lb

## 2013-11-22 DIAGNOSIS — R319 Hematuria, unspecified: Secondary | ICD-10-CM

## 2013-11-22 DIAGNOSIS — D126 Benign neoplasm of colon, unspecified: Secondary | ICD-10-CM

## 2013-11-22 DIAGNOSIS — K5730 Diverticulosis of large intestine without perforation or abscess without bleeding: Secondary | ICD-10-CM

## 2013-11-22 DIAGNOSIS — Z1211 Encounter for screening for malignant neoplasm of colon: Secondary | ICD-10-CM

## 2013-11-22 LAB — URINALYSIS, ROUTINE W REFLEX MICROSCOPIC
Bilirubin Urine: NEGATIVE
Ketones, ur: NEGATIVE
Nitrite: NEGATIVE
Specific Gravity, Urine: <=1.005 — AB (ref 1.000–1.030)
Total Protein, Urine: NEGATIVE
Urine Glucose: NEGATIVE
Urobilinogen, UA: 0.2 (ref 0.0–1.0)
pH: 6 (ref 5.0–8.0)

## 2013-11-22 MED ORDER — SODIUM CHLORIDE 0.9 % IV SOLN: 500.0000 mL | INTRAVENOUS | Status: DC

## 2013-11-22 NOTE — Op Note (Addendum)
[facility]  [address] [place], [id number]   COLONOSCOPY PROCEDURE REPORT  PATIENT: Vanessa, Owens  MR#: [id number] BIRTHDATE: 1960/10/02 , 52  yrs. old GENDER: Female ENDOSCOPIST: Inda Castle, MD REFERRED UP:JSRPRX Iaeger, DO PROCEDURE DATE:  11/22/2013 PROCEDURE:   Colonoscopy with snare polypectomy, colonoscopy plus biopsy First Screening Colonoscopy - Avg.  risk and is 50 yrs.  old or older Yes.  Prior Negative Screening - Now for repeat screening. N/A  History of Adenoma - Now for follow-up colonoscopy & has been > or = to 3 yrs.  N/A  Polyps Removed Today? Yes. ASA CLASS:   Class II INDICATIONS:average risk screening. MEDICATIONS: MAC sedation, administered by CRNA and Propofol (Diprivan) 280 mg IV  DESCRIPTION OF PROCEDURE:   After the risks benefits and alternatives of the procedure were thoroughly explained, informed consent was obtained.  A digital rectal exam revealed no abnormalities of the rectum.   The LB [id number]  endoscope was introduced through the anus and advanced to the terminal ileum which was intubated for a short distance. No adverse events experienced.   The quality of the prep was excellent using Suprep The instrument was then slowly withdrawn as the colon was fully examined.      COLON FINDINGS: A sessile polyp measuring 3 mm in size was found at the cecum.  A polypectomy was performed with a cold snare.  a 1 mm remnant was removed with a cold biopsy forceps.  The resection was complete and the polyp tissue was completely retrieved.   In the proximal ascending colon there was a 3 mm area of slightly prominent mucosa compare with the surrounding mucosa.  Cold biopsies were taken to rule out adenomatous changes.   In the proximal ascending colon there was a 3 mm area of slightly prominent mucosa compare with the surrounding mucosa.  Cold biopsies were taken to rule out adenomatous changes.   Mild diverticulosis  was noted in the sigmoid colon.   The colon was otherwise normal.  There was no diverticulosis, inflammation, polyps or cancers unless previously stated.  Retroflexed views revealed no abnormalities. The time to cecum=3 minutes 59 seconds. Withdrawal time=1 minutes 028 seconds.  The scope was withdrawn and the procedure completed. COMPLICATIONS: There were no complications.  ENDOSCOPIC IMPRESSION: 1.   colon polyps 2.  diverticulosis  RECOMMENDATIONS: If the polyp(s) removed today are proven to be adenomatous (pre-cancerous) polyps, you will need a repeat colonoscopy in 5 years.  Otherwise you should continue to follow colorectal cancer screening guidelines for "routine risk" patients with colonoscopy in 10 years.  You will receive a letter within 1-2 weeks with the results of your biopsy as well as final recommendations.  Please call my office if you have not received a letter after 3 weeks. resume Coumadin and Lovenox in a.m.   eSigned:  Inda Castle, MD 11/22/2013 3:33 PM Revised: 11/22/2013 3:33 PM  cc:   PATIENT NAME:  Vanessa, Owens MR#: [id number]

## 2013-11-22 NOTE — Progress Notes (Signed)
Called to room to assist during endoscopic procedure.  Patient ID and intended procedure confirmed with present staff. Received instructions for my participation in the procedure from the performing physician.  

## 2013-11-22 NOTE — Progress Notes (Signed)
A/ox3, pleased with MAC, report to RN 

## 2013-11-22 NOTE — Telephone Encounter (Signed)
Dr. [name] was off yesterday afternoon.  I spoke with him this am re: to phone note yesterday after 4:30 pm.  I called the pt back and asked how she was today. The pt said she spoke with a nurse in the coumadin clinic and she did not think the lovenox was the problem or the coumadin since she was off of it.  Per the pt her urine is not as dark today, but still has a "light red" color this am.  She c/o of a headache also.  I advised her she could take tylenol for her headache.  She said she would do that.  Per Dr. [name] to proceed with the colonoscopy today.  After the colonoscopy recovery time, the pt is to go to the lab for a urinalysis.  Dr. [name] has already put the order in Epic.  I also made Ernestine Conrad, RN aware who will be the pt's recovery room nurse, that the pt is to go to the lab on discharge.  I gave the pt this message this am 08:20am.  I also advised her to call back if any further questions or problems. maw

## 2013-11-22 NOTE — Patient Instructions (Signed)
YOU HAD AN ENDOSCOPIC PROCEDURE TODAY AT [facility]: Refer to the procedure report that was given to you for any specific questions about what was found during the examination.  If the procedure report does not answer your questions, please call your gastroenterologist to clarify.  If you requested that your care partner not be given the details of your procedure findings, then the procedure report has been included in a sealed envelope for you to review at your convenience later.  YOU SHOULD EXPECT: Some feelings of bloating in the abdomen. Passage of more gas than usual.  Walking can help get rid of the air that was put into your GI tract during the procedure and reduce the bloating. If you had a lower endoscopy (such as a colonoscopy or flexible sigmoidoscopy) you may notice spotting of blood in your stool or on the toilet paper. If you underwent a bowel prep for your procedure, then you may not have a normal bowel movement for a few days.  DIET: Your first meal following the procedure should be a light meal and then it is ok to progress to your normal diet.  A half-sandwich or bowl of soup is an example of a good first meal.  Heavy or fried foods are harder to digest and may make you feel nauseous or bloated.  Likewise meals heavy in dairy and vegetables can cause extra gas to form and this can also increase the bloating.  Drink plenty of fluids but you should avoid alcoholic beverages for 24 hours.Try to increase the fiber in your diet.  ACTIVITY: Your care partner should take you home directly after the procedure.  You should plan to take it easy, moving slowly for the rest of the day.  You can resume normal activity the day after the procedure however you should NOT DRIVE or use heavy machinery for 24 hours (because of the sedation medicines used during the test).    SYMPTOMS TO REPORT IMMEDIATELY: A gastroenterologist can be reached at any hour.  During normal business hours, 8:30  AM to 5:00 PM Monday through Friday, call [phone].  After hours and on weekends, please call the GI answering service at [phone] who will take a message and have the physician on call contact you.  Please read the handouts given to you by your recovery room nurse.  Resume your lovenox and coumadin in the AM per Dr. [name].   Following lower endoscopy (colonoscopy or flexible sigmoidoscopy):  Excessive amounts of blood in the stool  Significant tenderness or worsening of abdominal pains  Swelling of the abdomen that is new, acute  Fever of 100F or higher  FOLLOW UP: If any biopsies were taken you will be contacted by phone or by letter within the next 1-3 weeks.  Call your gastroenterologist if you have not heard about the biopsies in 3 weeks.  Our staff will call the home number listed on your records the next business day following your procedure to check on you and address any questions or concerns that you may have at that time regarding the information given to you following your procedure. This is a courtesy call and so if there is no answer at the home number and we have not heard from you through the emergency physician on call, we will assume that you have returned to your regular daily activities without incident.  SIGNATURES/CONFIDENTIALITY: You and/or your care partner have signed paperwork which will be entered into your electronic medical  record.  These signatures attest to the fact that that the information above on your After Visit Summary has been reviewed and is understood.  Full responsibility of the confidentiality of this discharge information lies with you and/or your care-partner. 

## 2013-11-23 ENCOUNTER — Telehealth: Payer: Self-pay | Admitting: *Deleted

## 2013-11-23 NOTE — Telephone Encounter (Signed)
No answer, left message to call if questions or concerns. 

## 2013-11-27 ENCOUNTER — Ambulatory Visit (INDEPENDENT_AMBULATORY_CARE_PROVIDER_SITE_OTHER): Payer: Managed Care, Other (non HMO)

## 2013-11-27 DIAGNOSIS — I359 Nonrheumatic aortic valve disorder, unspecified: Secondary | ICD-10-CM

## 2013-11-27 DIAGNOSIS — Z954 Presence of other heart-valve replacement: Secondary | ICD-10-CM

## 2013-11-27 DIAGNOSIS — Z7901 Long term (current) use of anticoagulants: Secondary | ICD-10-CM

## 2013-11-27 LAB — POCT INR: INR: 1.8

## 2013-11-28 ENCOUNTER — Encounter: Payer: Self-pay | Admitting: Gastroenterology

## 2013-12-03 ENCOUNTER — Telehealth: Payer: Self-pay | Admitting: Gastroenterology

## 2013-12-03 NOTE — Telephone Encounter (Signed)
Spoke with pt and she is aware of urine and path results.

## 2013-12-04 ENCOUNTER — Telehealth: Payer: Self-pay | Admitting: Family Medicine

## 2013-12-04 ENCOUNTER — Ambulatory Visit (INDEPENDENT_AMBULATORY_CARE_PROVIDER_SITE_OTHER): Payer: Managed Care, Other (non HMO) | Admitting: Pharmacist

## 2013-12-04 DIAGNOSIS — Z7901 Long term (current) use of anticoagulants: Secondary | ICD-10-CM

## 2013-12-04 DIAGNOSIS — I359 Nonrheumatic aortic valve disorder, unspecified: Secondary | ICD-10-CM

## 2013-12-04 DIAGNOSIS — R319 Hematuria, unspecified: Secondary | ICD-10-CM

## 2013-12-04 DIAGNOSIS — Z954 Presence of other heart-valve replacement: Secondary | ICD-10-CM

## 2013-12-04 LAB — POCT INR: INR: 2.8

## 2013-12-04 NOTE — Telephone Encounter (Signed)
Caller name: Teighan Relation to [id number] Call back [place]:  Reason for call: Patient called to speak with a nurse regarding her last UA. Please advise.

## 2013-12-04 NOTE — Telephone Encounter (Signed)
Probably has a UTI. Ask if she has any sx (F, chills, dysuria) rec to RTC for urine sample:  UA,  UCX If she doesn't have any sx, will treat w/ results

## 2013-12-04 NOTE — Telephone Encounter (Signed)
Prior to colonoscopy, Dr. [name] performed a UA on patient urine. Results were sent to Dr. [name] for her review.  Please see Lab results 11/22/13.  Pt did have a small amount of blood in urine and Dr. [name] recommended that she follow up with Dr. [name].  She is on coumadin and has been recently taken off of Lovenox right before colonoscopy.  Pt is inquiring about what to do next.    Please advise.

## 2013-12-05 NOTE — Telephone Encounter (Signed)
Left a message for call back.  

## 2013-12-06 ENCOUNTER — Other Ambulatory Visit (INDEPENDENT_AMBULATORY_CARE_PROVIDER_SITE_OTHER): Payer: Managed Care, Other (non HMO)

## 2013-12-06 DIAGNOSIS — R319 Hematuria, unspecified: Secondary | ICD-10-CM

## 2013-12-06 LAB — URINALYSIS
BILIRUBIN URINE: NEGATIVE
Hgb urine dipstick: NEGATIVE
KETONES UR: NEGATIVE
Leukocytes, UA: NEGATIVE
NITRITE: NEGATIVE
Total Protein, Urine: NEGATIVE
UROBILINOGEN UA: 0.2 (ref 0.0–1.0)
Urine Glucose: NEGATIVE
pH: 7 (ref 5.0–8.0)

## 2013-12-06 LAB — POCT URINALYSIS DIPSTICK
BILIRUBIN UA: NEGATIVE
Glucose, UA: NEGATIVE
Ketones, UA: NEGATIVE
Nitrite, UA: NEGATIVE
PH UA: 7.5
RBC UA: NEGATIVE
SPEC GRAV UA: 1.01
Urobilinogen, UA: 0.2

## 2013-12-06 NOTE — Telephone Encounter (Signed)
Currently no symptoms.  Pt scheduled to come in today at 1:15 pm to provide urine sample.  Orders placed for UA and UCX.

## 2013-12-07 LAB — URINE CULTURE
Colony Count: NO GROWTH
Organism ID, Bacteria: NO GROWTH

## 2013-12-12 ENCOUNTER — Telehealth: Payer: Self-pay | Admitting: Gastroenterology

## 2013-12-12 ENCOUNTER — Telehealth: Payer: Self-pay | Admitting: Family Medicine

## 2013-12-12 NOTE — Telephone Encounter (Signed)
Pt states that when she had her colon done she thought she was told she had one polyp removed and that there was an area that wasn't really a polyp and he took a biopsy of that area. Pt received her path letter that states she had an adenomatous polyp. She is concerned about the area that was biopsied, she wants to make sure she doesn't need to have anything else done/removed. Please advise.

## 2013-12-12 NOTE — Telephone Encounter (Signed)
The area that was biopsied was probably entirely removed.  It was not an adenomatous polyp but another type of polyp which could very slowly grow.  I do not see any necessity for repeating colonoscopy at this time, however, to be even more conservative, we can reduce the interval for followup colonoscopy from 5 to 3 years.

## 2013-12-12 NOTE — Telephone Encounter (Signed)
See additional phone note. 

## 2013-12-12 NOTE — Telephone Encounter (Signed)
Left message for pt to call back.  Pt aware and recall changed to 3 years.

## 2013-12-12 NOTE — Telephone Encounter (Signed)
Caller name: Dahlila  Call back number:[phone]   Reason for call: pt had urinalysis done on 7/23, pt would like results please and explanation

## 2013-12-12 NOTE — Telephone Encounter (Signed)
Patient has been made aware, and labs have been mailed     KP

## 2013-12-18 ENCOUNTER — Ambulatory Visit (INDEPENDENT_AMBULATORY_CARE_PROVIDER_SITE_OTHER): Payer: Managed Care, Other (non HMO)

## 2013-12-18 DIAGNOSIS — Z954 Presence of other heart-valve replacement: Secondary | ICD-10-CM

## 2013-12-18 DIAGNOSIS — I359 Nonrheumatic aortic valve disorder, unspecified: Secondary | ICD-10-CM

## 2013-12-18 DIAGNOSIS — Z7901 Long term (current) use of anticoagulants: Secondary | ICD-10-CM

## 2013-12-18 LAB — POCT INR: INR: 3.3

## 2014-01-02 ENCOUNTER — Other Ambulatory Visit: Payer: Self-pay | Admitting: Obstetrics & Gynecology

## 2014-01-02 NOTE — Telephone Encounter (Signed)
Last AEX: 01/01/13  Last refill:02/02/13 Last MMG: 03/2013 BI-RADS neg Current AEX:01/07/14  Sent 1 refills until pt come in the office for AEX  Encounter closed

## 2014-01-07 ENCOUNTER — Ambulatory Visit (INDEPENDENT_AMBULATORY_CARE_PROVIDER_SITE_OTHER): Payer: Managed Care, Other (non HMO) | Admitting: Obstetrics & Gynecology

## 2014-01-07 ENCOUNTER — Encounter: Payer: Self-pay | Admitting: Obstetrics & Gynecology

## 2014-01-07 VITALS — BP 122/70 | HR 64 | Resp 16 | Ht 60.5 in | Wt 146.8 lb

## 2014-01-07 DIAGNOSIS — N912 Amenorrhea, unspecified: Secondary | ICD-10-CM

## 2014-01-07 DIAGNOSIS — Z124 Encounter for screening for malignant neoplasm of cervix: Secondary | ICD-10-CM

## 2014-01-07 DIAGNOSIS — M899 Disorder of bone, unspecified: Secondary | ICD-10-CM

## 2014-01-07 DIAGNOSIS — Z0000 Encounter for general adult medical examination without abnormal findings: Secondary | ICD-10-CM

## 2014-01-07 DIAGNOSIS — M8580 Other specified disorders of bone density and structure, unspecified site: Secondary | ICD-10-CM

## 2014-01-07 DIAGNOSIS — M949 Disorder of cartilage, unspecified: Secondary | ICD-10-CM

## 2014-01-07 DIAGNOSIS — Z01419 Encounter for gynecological examination (general) (routine) without abnormal findings: Secondary | ICD-10-CM

## 2014-01-07 LAB — POCT URINALYSIS DIPSTICK
Bilirubin, UA: NEGATIVE
Blood, UA: NEGATIVE
GLUCOSE UA: NEGATIVE
Ketones, UA: NEGATIVE
NITRITE UA: NEGATIVE
PROTEIN UA: NEGATIVE
UROBILINOGEN UA: NEGATIVE
pH, UA: 8

## 2014-01-07 LAB — HEMOGLOBIN, FINGERSTICK: HEMOGLOBIN, FINGERSTICK: 14.5 g/dL (ref 12.0–16.0)

## 2014-01-07 MED ORDER — NORETHINDRONE 0.35 MG PO TABS: ORAL_TABLET | ORAL | Status: DC

## 2014-01-07 NOTE — Addendum Note (Signed)
Addended by: Megan Salon on: 01/07/2014 04:46 PM   Modules accepted: Orders

## 2014-01-07 NOTE — Progress Notes (Addendum)
53 y.o. G2P2 SingleCaucasianF here for annual exam.  Had colonoscopy 6/15.  Visit with Dr. [name] in February.  Saw Dr. Johnsie Cancel in May.  Had echocardiogram then.  No LMP recorded. Patient is not currently having periods (Reason: Other).          Sexually active: Yes.    The current method of family planning is oral progesterone-only contraceptive.    Exercising: Yes.    walking Smoker:  no  Health Maintenance: Pap:  01/06/12 WNL/negative HR HPV History of abnormal Pap:  yes MMG:  04/09/13-normal Colonoscopy:  6/15-repeat in 5 years BMD:   4/07 TDaP:  10/30/09 Screening Labs: PCP, Hb today: 14.5, Urine today: WBC-trace, PH-8.0   reports that she has never smoked. She has never used smokeless tobacco. She reports that she drinks about .5 - 1 ounces of alcohol per week. She reports that she does not use illicit drugs.  Past Medical History  Diagnosis Date  . Aortic stenosis      by Dr. Prescott Gum  . 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 CHEST PAIN UNSPECIFIED     Qualifier: Diagnosis of  By: Burnett Harry MD, Shanon Brow    . Long term current use of anticoagulant   . Shortness of breath     Qualifier: Diagnosis of  By: Allean Found, LPN, [name]    . Anemia   . Vulvar ulceration     recurrent  . OAB (overactive bladder)   . Osteopenia   . Migraine   . Costochondritis     x2  . Shingles     ? diag/rash 11/21/12  . Rash   . Colon polyp   . Diverticulosis     Past Surgical History  Procedure Laterality Date  . Aortic valve replacement  2001    [employer]  . Cesarean section    . Appendectomy      post op abdominal wall hematoma/2 transfusions  . Breast biopsy  5/08    left breast    Current Outpatient Prescriptions  Medication Sig Dispense Refill  . Calcium Carbonate-Vitamin D (CALCIUM-VITAMIN D) 500-200 MG-UNIT per tablet Take 1 tablet by mouth daily. Vitafusion- Take 2 gummies      . FIBER PO Take by mouth. Gummies-daily      . norethindrone (MICRONOR,CAMILA,ERRIN) 0.35 MG  tablet TAKE 1 TABLET BY MOUTH EVERY DAY AS DIRECTED  1 Package  0  . omega-3 acid ethyl esters (LOVAZA) 1 G capsule Take by mouth daily.      Marland Kitchen warfarin (COUMADIN) 7.5 MG tablet Take as directed by Anticoagulation clinic  90 tablet  1  . amoxicillin (AMOXIL) 500 MG capsule Take 1 tablet by mouth. Prior to dental procedures      . Multiple Vitamin (MULTIVITAMIN) capsule Take 1 capsule by mouth.       No current facility-administered medications for this visit.    Family History  Problem Relation Age of Onset  . Hypertension Mother   . Diabetes Mother   . Hyperlipidemia Mother   . Diabetes Brother   . Hyperlipidemia Brother   . Hypertension Brother   . Diabetes Other   . Diabetes Son   . Alzheimer's disease Maternal Grandfather     ROS:  Pertinent items are noted in HPI.  Otherwise, a comprehensive ROS was negative.  Exam:   BP 122/70  Pulse 64  Resp 16  Ht 5' 0.5" (1.537 m)  Wt 146 lb 12.8 oz (66.588 kg)  BMI 28.19 kg/m2  Weight change:-7#   Height: 5' 0.5" (153.7 cm)  Ht Readings from Last 3 Encounters:  01/07/14 5' 0.5" (1.537 m)  11/22/13 5' (1.524 m)  10/19/13 5' 0.25" (1.53 m)    General appearance: alert, cooperative and appears stated age Head: Normocephalic, without obvious abnormality, atraumatic Neck: no adenopathy, supple, symmetrical, trachea midline and thyroid normal to inspection and palpation Lungs: clear to auscultation bilaterally Breasts: normal appearance, no masses or tenderness Heart: regular rate and rhythm Abdomen: soft, non-tender; bowel sounds normal; no masses,  no organomegaly Extremities: extremities normal, atraumatic, no cyanosis or edema Skin: Skin color, texture, turgor normal. No rashes or lesions Lymph nodes: Cervical, supraclavicular, and axillary nodes normal. No abnormal inguinal nodes palpated Neurologic: Grossly normal   Pelvic: External genitalia:  no lesions              Urethra:  normal appearing urethra with no masses,  tenderness or lesions              Bartholins and Skenes: normal                 Vagina: normal appearing vagina with normal color and discharge, no lesions              Cervix: no lesions              Pap taken: Yes.   Bimanual Exam:  Uterus:  normal size, contour, position, consistency, mobility, non-tender              Adnexa: normal adnexa and no mass, fullness, tenderness               Rectovaginal: Confirms               Anus:  normal sphincter tone, no lesions  A:  Well Woman with normal exam  Amenorrhea on micronir  Aortic valve replacement on chronic anticoagulation  Osteopenia  OAB   P: Mammogram yearly.  pap smear with neg HR HPV last year 2013.  Pap today. [place] today.  Rx micronor for now due to pt anxiety regarding pregnancy. BMD order placed. return annually or prn  An After Visit Summary was printed and given to the patient.

## 2014-01-07 NOTE — Addendum Note (Signed)
Addended by: Robley Fries on: 01/07/2014 04:49 PM   Modules accepted: Orders

## 2014-01-08 LAB — FOLLICLE STIMULATING HORMONE: FSH: 64.1 m[IU]/mL

## 2014-01-10 LAB — IPS PAP TEST WITH REFLEX TO HPV

## 2014-01-15 ENCOUNTER — Telehealth: Payer: Self-pay

## 2014-01-15 NOTE — Telephone Encounter (Signed)
Message copied by Robley Fries on Tue Jan 15, 2014 10:33 AM ------      Message from: Megan Salon      Created: Fri Jan 11, 2014  5:51 PM       Please call pt and inform Vanessa Owens is 68.  Really she can stop the micronor.  Of course, she needs to let me know if she has any bleeding.        02 pap recall.  Informed results normal via MyChart.       ------

## 2014-01-15 NOTE — Telephone Encounter (Signed)
Lmtcb//kn 

## 2014-01-18 NOTE — Telephone Encounter (Signed)
Return call to Kelly. °

## 2014-01-22 NOTE — Telephone Encounter (Signed)
Spoke with patient. Advised of results as seen below. Patient states that she really does not want to stop the micronor right now. "I want to stay on it for another year." Advised patient of [place] range and with her level from this year and last year she is able to stop the micronor at this time. Patient still declines and would like to remain on micronor. Advised patient would send a message to Dr.Miller and give her a call back. Patient using [facility] in [place].

## 2014-01-23 ENCOUNTER — Ambulatory Visit (INDEPENDENT_AMBULATORY_CARE_PROVIDER_SITE_OTHER): Payer: Managed Care, Other (non HMO) | Admitting: Pharmacist

## 2014-01-23 DIAGNOSIS — Z7901 Long term (current) use of anticoagulants: Secondary | ICD-10-CM

## 2014-01-23 DIAGNOSIS — Z954 Presence of other heart-valve replacement: Secondary | ICD-10-CM

## 2014-01-23 DIAGNOSIS — I359 Nonrheumatic aortic valve disorder, unspecified: Secondary | ICD-10-CM

## 2014-01-23 LAB — POCT INR: INR: 3

## 2014-01-23 MED ORDER — NORETHINDRONE 0.35 MG PO TABS: ORAL_TABLET | ORAL | Status: DC

## 2014-01-23 NOTE — Telephone Encounter (Signed)
That is fine.  She is not having any side effects from it.  Order placed for 90 day supply and 4 RFs.  Please notify pt and ok to close encounter.

## 2014-01-23 NOTE — Telephone Encounter (Signed)
Left message to call Kaymen Adrian at [phone]. 

## 2014-01-23 NOTE — Telephone Encounter (Signed)
Spoke with patient. Advised of message as seen below from Dr.Miller. Patient is agreeable and verbalizes understanding.  Routing to provider for final review. Patient agreeable to disposition. Will close encounter   

## 2014-01-30 ENCOUNTER — Other Ambulatory Visit: Payer: Self-pay | Admitting: *Deleted

## 2014-01-30 MED ORDER — WARFARIN SODIUM 7.5 MG PO TABS
ORAL_TABLET | ORAL | Status: DC
Start: 2014-01-30 — End: 2014-05-26

## 2014-02-28 ENCOUNTER — Ambulatory Visit (INDEPENDENT_AMBULATORY_CARE_PROVIDER_SITE_OTHER): Payer: Managed Care, Other (non HMO) | Admitting: Pharmacist Clinician (PhC)/ Clinical Pharmacy Specialist

## 2014-02-28 DIAGNOSIS — I359 Nonrheumatic aortic valve disorder, unspecified: Secondary | ICD-10-CM

## 2014-02-28 DIAGNOSIS — Z7901 Long term (current) use of anticoagulants: Secondary | ICD-10-CM

## 2014-02-28 DIAGNOSIS — Z952 Presence of prosthetic heart valve: Secondary | ICD-10-CM

## 2014-02-28 DIAGNOSIS — Z954 Presence of other heart-valve replacement: Secondary | ICD-10-CM

## 2014-02-28 LAB — POCT INR: INR: 3

## 2014-03-06 ENCOUNTER — Encounter: Payer: Self-pay | Admitting: Emergency Medicine

## 2014-03-06 ENCOUNTER — Emergency Department (INDEPENDENT_AMBULATORY_CARE_PROVIDER_SITE_OTHER)
Admission: EM | Admit: 2014-03-06 | Discharge: 2014-03-06 | Disposition: A | Discharge status: Home / Self Care | Payer: Managed Care, Other (non HMO) | Source: Home / Self Care | Attending: Family Medicine | Admitting: Family Medicine

## 2014-03-06 DIAGNOSIS — R1032 Left lower quadrant pain: Secondary | ICD-10-CM

## 2014-03-06 LAB — POCT URINALYSIS DIP (MANUAL ENTRY)
BILIRUBIN UA: NEGATIVE
BILIRUBIN UA: NEGATIVE
Glucose, UA: NEGATIVE
NITRITE UA: NEGATIVE
PROTEIN UA: NEGATIVE
Spec Grav, UA: 1.015 (ref 1.005–1.03)
Urobilinogen, UA: 0.2 (ref 0–1)
pH, UA: 6.5 (ref 5–8)

## 2014-03-06 NOTE — ED Notes (Signed)
LLQ abdominal pain intermittent x 2 weeks. Denies N/V/D or sweats/fever. Last BM yesterday. Colonoscopy earlier this year, removed polyps.

## 2014-03-06 NOTE — Discharge Instructions (Signed)
Thank you for coming in today. I will call if anything comes back positive.  Come back as needed.  If your belly pain worsens, or you have high fever, bad vomiting, blood in your stool or black tarry stool go to the Emergency Room.   Abdominal Pain, Women Abdominal (stomach, pelvic, or belly) pain can be caused by many things. It is important to tell your doctor:  The location of the pain.  Does it come and go or is it present all the time?  Are there things that start the pain (eating certain foods, exercise)?  Are there other symptoms associated with the pain (fever, nausea, vomiting, diarrhea)? All of this is helpful to know when trying to find the cause of the pain. CAUSES   Stomach: virus or bacteria infection, or ulcer.  Intestine: appendicitis (inflamed appendix), regional ileitis (Crohn's disease), ulcerative colitis (inflamed colon), irritable bowel syndrome, diverticulitis (inflamed diverticulum of the colon), or cancer of the stomach or intestine.  Gallbladder disease or stones in the gallbladder.  Kidney disease, kidney stones, or infection.  Pancreas infection or cancer.  Fibromyalgia (pain disorder).  Diseases of the female organs:  Uterus: fibroid (non-cancerous) tumors or infection.  Fallopian tubes: infection or tubal pregnancy.  Ovary: cysts or tumors.  Pelvic adhesions (scar tissue).  Endometriosis (uterus lining tissue growing in the pelvis and on the pelvic organs).  Pelvic congestion syndrome (female organs filling up with blood just before the menstrual period).  Pain with the menstrual period.  Pain with ovulation (producing an egg).  Pain with an IUD (intrauterine device, birth control) in the uterus.  Cancer of the female organs.  Functional pain (pain not caused by a disease, may improve without treatment).  Psychological pain.  Depression. DIAGNOSIS  Your doctor will decide the seriousness of your pain by doing an  examination.  Blood tests.  X-rays.  Ultrasound.  CT scan (computed tomography, special type of X-ray).  MRI (magnetic resonance imaging).  Cultures, for infection.  Barium enema (dye inserted in the large intestine, to better view it with X-rays).  Colonoscopy (looking in intestine with a lighted tube).  Laparoscopy (minor surgery, looking in abdomen with a lighted tube).  Major abdominal exploratory surgery (looking in abdomen with a large incision). TREATMENT  The treatment will depend on the cause of the pain.   Many cases can be observed and treated at home.  Over-the-counter medicines recommended by your caregiver.  Prescription medicine.  Antibiotics, for infection.  Birth control pills, for painful periods or for ovulation pain.  Hormone treatment, for endometriosis.  Nerve blocking injections.  Physical therapy.  Antidepressants.  Counseling with a psychologist or psychiatrist.  Minor or major surgery. HOME CARE INSTRUCTIONS   Do not take laxatives, unless directed by your caregiver.  Take over-the-counter pain medicine only if ordered by your caregiver. Do not take aspirin because it can cause an upset stomach or bleeding.  Try a clear liquid diet (broth or water) as ordered by your caregiver. Slowly move to a bland diet, as tolerated, if the pain is related to the stomach or intestine.  Have a thermometer and take your temperature several times a day, and record it.  Bed rest and sleep, if it helps the pain.  Avoid sexual intercourse, if it causes pain.  Avoid stressful situations.  Keep your follow-up appointments and tests, as your caregiver orders.  If the pain does not go away with medicine or surgery, you may try:  Acupuncture.  Relaxation exercises (  yoga, meditation).  Group therapy.  Counseling. SEEK MEDICAL CARE IF:   You notice certain foods cause stomach pain.  Your home care treatment is not helping your pain.  You  need stronger pain medicine.  You want your IUD removed.  You feel faint or lightheaded.  You develop nausea and vomiting.  You develop a rash.  You are having side effects or an allergy to your medicine. SEEK IMMEDIATE MEDICAL CARE IF:   Your pain does not go away or gets worse.  You have a fever.  Your pain is felt only in portions of the abdomen. The right side could possibly be appendicitis. The left lower portion of the abdomen could be colitis or diverticulitis.  You are passing blood in your stools (bright red or black tarry stools, with or without vomiting).  You have blood in your urine.  You develop chills, with or without a fever.  You pass out. MAKE SURE YOU:   Understand these instructions.  Will watch your condition.  Will get help right away if you are not doing well or get worse. Document Released: 02/28/2007 Document Revised: 09/17/2013 Document Reviewed: 03/20/2009 [facility] Patient Information 2015 [place], [place]. This information is not intended to replace advice given to you by your health care provider. Make sure you discuss any questions you have with your health care provider.

## 2014-03-06 NOTE — ED Provider Notes (Signed)
Vanessa Owens is a 53 y.o. female who presents to Urgent Care today for left lower quadrant abdominal pain. Patient has a two-week history of intermittent sharp left lower corner and abdominal pain. The pain is moderate to severe but or last for minutes and occurs only a few times a day. She denies any fevers or chills nausea vomiting or diarrhea. Her last bowel movement was yesterday and normal. She denies any blood in the stool. She denies any urinary frequency urgency or dysuria. She does note mild vaginal discharge. Her last period was years ago. She had a colonoscopy this year and was diagnosed with diverticulosis and a colon polyp.   Past Medical History  Diagnosis Date  . Aortic stenosis      by Dr. Prescott Gum  . 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 CHEST PAIN UNSPECIFIED     Qualifier: Diagnosis of  By: Burnett Harry MD, Shanon Brow    . Long term current use of anticoagulant   . Shortness of breath     Qualifier: Diagnosis of  By: Allean Found, LPN, [name]    . Anemia   . Vulvar ulceration     recurrent  . OAB (overactive bladder)   . Osteopenia   . Migraine   . Costochondritis     x2  . Shingles     ? diag/rash 11/21/12  . Rash   . Colon polyp   . Diverticulosis    History  Substance Use Topics  . Smoking status: Never Smoker   . Smokeless tobacco: Never Used  . Alcohol Use: 0.5 - 1.0 oz/week    1-2 drink(s) per week     Comment: occ   ROS as above Medications: No current facility-administered medications for this encounter.   Current Outpatient Prescriptions  Medication Sig Dispense Refill  . Calcium Carbonate-Vitamin D (CALCIUM-VITAMIN D) 500-200 MG-UNIT per tablet Take 1 tablet by mouth daily. Vitafusion- Take 2 gummies      . FIBER PO Take by mouth. Gummies-daily      . Misc Natural Products (FIBER 7 PO) Take by mouth.      . Multiple Vitamin (MULTIVITAMIN) capsule Take 1 capsule by mouth.      . norethindrone (MICRONOR,CAMILA,ERRIN) 0.35 MG tablet TAKE 1 TABLET  BY MOUTH EVERY DAY AS DIRECTED  3 Package  4  . omega-3 acid ethyl esters (LOVAZA) 1 G capsule Take by mouth daily.      Marland Kitchen warfarin (COUMADIN) 7.5 MG tablet Take as directed by Anticoagulation clinic  90 tablet  1    Exam:  BP 146/89  Pulse 82  Temp(Src) 98.1 F (36.7 C) (Oral)  Resp 14  Wt 148 lb (67.132 kg)  SpO2 100% Gen: Well NAD HEENT: EOMI,  MMM Lungs: Normal work of breathing. CTABL Heart: RRR no MRG Abd: NABS, Soft. Nondistended, Nontender no rebound or guarding. Exts: Brisk capillary refill, warm and well perfused.  GYN: Normal external genitalia.  Normal vaginal canal with minimal discharge. Normal appearing cervix. No adnexal masses. Non-tender.   Results for orders placed during the hospital encounter of 03/06/14 (from the past 24 hour(s))  POCT URINALYSIS DIP (MANUAL ENTRY)     Status: None   Collection Time    03/06/14  9:03 AM      Result Value Ref Range   Color, UA yellow     Clarity, UA clear     Glucose, UA neg     Bilirubin, UA negative  Bilirubin, UA negative     Spec Grav, UA 1.015  1.005 - 1.03   Blood, UA trace-intact     pH, UA 6.5  5 - 8   Protein Ur, POC negative     Urobilinogen, UA 0.2  0 - 1   Nitrite, UA Negative     Leukocytes, UA small (1+)     No results found.  Assessment and Plan: 53 y.o. female with unclear intermittent left lower corner and abdominal pain. Possible urinary tract infection. Culture is pending. Discussed options with patient. Collect for watchful waiting and followup urine culture. Wet prep and GC/Chlamydia are pending. Followup with PCP.  Discussed warning signs or symptoms. Please see discharge instructions. Patient expresses understanding.     Gregor Hams, MD 03/06/14 [phone]

## 2014-03-07 LAB — WET PREP, GENITAL
Clue Cells Wet Prep HPF POC: NONE SEEN
Trich, Wet Prep: NONE SEEN
WBC, Wet Prep HPF POC: NONE SEEN
Yeast Wet Prep HPF POC: NONE SEEN

## 2014-03-07 LAB — URINE CULTURE
Colony Count: NO GROWTH
Organism ID, Bacteria: NO GROWTH

## 2014-03-07 LAB — GC/CHLAMYDIA PROBE AMP
CT Probe RNA: NEGATIVE
GC Probe RNA: NEGATIVE

## 2014-03-08 ENCOUNTER — Telehealth: Payer: Self-pay | Admitting: *Deleted

## 2014-03-15 ENCOUNTER — Ambulatory Visit: Payer: Managed Care, Other (non HMO) | Admitting: Physician Assistant

## 2014-03-15 DIAGNOSIS — Z0289 Encounter for other administrative examinations: Secondary | ICD-10-CM

## 2014-03-18 ENCOUNTER — Ambulatory Visit: Payer: Managed Care, Other (non HMO) | Admitting: Physician Assistant

## 2014-03-18 ENCOUNTER — Encounter: Payer: Self-pay | Admitting: Physician Assistant

## 2014-03-18 ENCOUNTER — Ambulatory Visit (INDEPENDENT_AMBULATORY_CARE_PROVIDER_SITE_OTHER): Payer: Managed Care, Other (non HMO) | Admitting: Physician Assistant

## 2014-03-18 VITALS — BP 136/69 | HR 86 | Temp 97.6°F | Resp 16 | Ht 65.0 in | Wt 149.5 lb

## 2014-03-18 DIAGNOSIS — K5732 Diverticulitis of large intestine without perforation or abscess without bleeding: Secondary | ICD-10-CM

## 2014-03-18 MED ORDER — AMOXICILLIN-POT CLAVULANATE 875-125 MG PO TABS: 1.0000 | ORAL_TABLET | Freq: Two times a day (BID) | ORAL | Status: DC

## 2014-03-18 NOTE — Patient Instructions (Signed)
Please take antibiotic as directed.  Increase fluids.  Continue fiber supplement and begin a daily probiotic.  Call your Coumadin clinic and inform them of the antibiotic so they can schedule a PT/INR check up early.  Follow-up if symptoms do not resolve.  Diverticulitis Diverticulitis is inflammation or infection of small pouches in your colon that form when you have a condition called diverticulosis. The pouches in your colon are called diverticula. Your colon, or large intestine, is where water is absorbed and stool is formed. Complications of diverticulitis can include:  Bleeding.  Severe infection.  Severe pain.  Perforation of your colon.  Obstruction of your colon. CAUSES  Diverticulitis is caused by bacteria. Diverticulitis happens when stool becomes trapped in diverticula. This allows bacteria to grow in the diverticula, which can lead to inflammation and infection. RISK FACTORS People with diverticulosis are at risk for diverticulitis. Eating a diet that does not include enough fiber from fruits and vegetables may make diverticulitis more likely to develop. SYMPTOMS  Symptoms of diverticulitis may include:  Abdominal pain and tenderness. The pain is normally located on the left side of the abdomen, but may occur in other areas.  Fever and chills.  Bloating.  Cramping.  Nausea.  Vomiting.  Constipation.  Diarrhea.  Blood in your stool. DIAGNOSIS  Your health care provider will ask you about your medical history and do a physical exam. You may need to have tests done because many medical conditions can cause the same symptoms as diverticulitis. Tests may include:  Blood tests.  Urine tests.  Imaging tests of the abdomen, including X-rays and CT scans. When your condition is under control, your health care provider may recommend that you have a colonoscopy. A colonoscopy can show how severe your diverticula are and whether something else is causing your  symptoms. TREATMENT  Most cases of diverticulitis are mild and can be treated at home. Treatment may include:  Taking over-the-counter pain medicines.  Following a clear liquid diet.  Taking antibiotic medicines by mouth for 7-10 days. More severe cases may be treated at a hospital. Treatment may include:  Not eating or drinking.  Taking prescription pain medicine.  Receiving antibiotic medicines through an IV tube.  Receiving fluids and nutrition through an IV tube.  Surgery. HOME CARE INSTRUCTIONS   Follow your health care provider's instructions carefully.  Follow a full liquid diet or other diet as directed by your health care provider. After your symptoms improve, your health care provider may tell you to change your diet. He or she may recommend you eat a high-fiber diet. Fruits and vegetables are good sources of fiber. Fiber makes it easier to pass stool.  Take fiber supplements or probiotics as directed by your health care provider.  Only take medicines as directed by your health care provider.  Keep all your follow-up appointments. SEEK MEDICAL CARE IF:   Your pain does not improve.  You have a hard time eating food.  Your bowel movements do not return to normal. SEEK IMMEDIATE MEDICAL CARE IF:   Your pain becomes worse.  Your symptoms do not get better.  Your symptoms suddenly get worse.  You have a fever.  You have repeated vomiting.  You have bloody or black, tarry stools. MAKE SURE YOU:   Understand these instructions.  Will watch your condition.  Will get help right away if you are not doing well or get worse. Document Released: 02/10/2005 Document Revised: 05/08/2013 Document Reviewed: 03/28/2013 Exit[employer] Patient Information 2015  [employer]. This information is not intended to replace advice given to you by your health care provider. Make sure you discuss any questions you have with your health care provider.

## 2014-03-18 NOTE — Progress Notes (Signed)
Patient presents to clinic today c/o continued LLQ pain over the past week.  Was seen at an UC where UA and Wet prep were obtained.  Culture was also sent.  All testing was unremarkable.  Patient c/o throbbing pain in LLQ with intermittent stabbing sensation.  Denies nausea or vomiting.  Denies fever, chills.  Endorses good urinary and bowel output.  Denies tenesmus, melena or hematochezia. Was recently diagnosed with diverticulosis on Colonoscopy. Denies hx of diverticulitis.  Patient is currently on chronic coumadin therapy.  Last INR within normal limits 2 weeks ago.  Past Medical History  Diagnosis Date  . Aortic stenosis      by Dr. Prescott Gum  . 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 CHEST PAIN UNSPECIFIED     Qualifier: Diagnosis of  By: Burnett Harry MD, Shanon Brow    . Long term current use of anticoagulant   . Shortness of breath     Qualifier: Diagnosis of  By: Allean Found, LPN, [name]    . Anemia   . Vulvar ulceration     recurrent  . OAB (overactive bladder)   . Osteopenia   . Migraine   . Costochondritis     x2  . Shingles     ? diag/rash 11/21/12  . Rash   . Colon polyp   . Diverticulosis     Current Outpatient Prescriptions on File Prior to Visit  Medication Sig Dispense Refill  . Calcium Carbonate-Vitamin D (CALCIUM-VITAMIN D) 500-200 MG-UNIT per tablet Take 1 tablet by mouth daily. Vitafusion- Take 2 gummies    . FIBER PO Take by mouth. Gummies-daily    . Multiple Vitamin (MULTIVITAMIN) capsule Take 1 capsule by mouth.    . norethindrone (MICRONOR,CAMILA,ERRIN) 0.35 MG tablet TAKE 1 TABLET BY MOUTH EVERY DAY AS DIRECTED 3 Package 4  . omega-3 acid ethyl esters (LOVAZA) 1 G capsule Take by mouth daily.    Marland Kitchen warfarin (COUMADIN) 7.5 MG tablet Take as directed by Anticoagulation clinic 90 tablet 1   No current facility-administered medications on file prior to visit.    Allergies  Allergen Reactions  . Morphine     REACTION: sick    Family History  Problem  Relation Age of Onset  . Hypertension Mother   . Diabetes Mother   . Hyperlipidemia Mother   . Diabetes Brother   . Hyperlipidemia Brother   . Hypertension Brother   . Diabetes Other   . Diabetes Son   . Alzheimer's disease Maternal Grandfather     History   Social History  . Marital Status: Single    Spouse Name: N/A    Number of Children: 2  . Years of Education: N/A   Occupational History  . benefits specialist [place] History Main Topics  . Smoking status: Never Smoker   . Smokeless tobacco: Never Used  . Alcohol Use: 0.5 - 1.0 oz/week    1-2 drink(s) per week     Comment: occ  . Drug Use: No  . Sexual Activity:    Partners: Male    Birth Control/ Protection: Pill   Other Topics Concern  . None   Social History Narrative   Review of Systems - See HPI.  All other ROS are negative.  BP 136/69 mmHg  Pulse 86  Temp(Src) 97.6 F (36.4 C) (Oral)  Resp 16  Ht 5\' 5"  (1.651 m)  Wt 149 lb 8 oz (67.813 kg)  BMI 24.88 kg/m2  SpO2 100%  Physical Exam  Constitutional: She is oriented to person, place, and time and well-developed, well-nourished, and in no distress.  HENT:  Head: Normocephalic and atraumatic.  Eyes: Conjunctivae are normal.  Cardiovascular: Normal rate, regular rhythm, normal heart sounds and intact distal pulses.   Pulmonary/Chest: Effort normal and breath sounds normal. No respiratory distress. She has no wheezes. She has no rales. She exhibits no tenderness.  Abdominal: Soft. Bowel sounds are normal. She exhibits no distension and no mass. There is no hepatosplenomegaly. There is tenderness in the left lower quadrant. There is no rebound and no guarding.  Neurological: She is alert and oriented to person, place, and time.  Vitals reviewed.   Recent Results (from the past 2160 hour(s))  POCT Urinalysis Dipstick     Status: None   Collection Time: 01/07/14  4:48 PM  Result Value Ref Range   Color, UA     Clarity, UA      Glucose, UA n    Bilirubin, UA n    Ketones, UA n    Spec Grav, UA     Blood, UA n    pH, UA 8.0    Protein, UA n    Urobilinogen, UA negative    Nitrite, UA n    Leukocytes, UA Trace   Follicle stimulating hormone     Status: None   Collection Time: 01/07/14  4:49 PM  Result Value Ref Range   FSH 64.1 mIU/mL    Comment: Reference Ranges:          Female:                         1.4 -  18.1 mIU/mL          Female:   Follicular Phase    2.5 -  10.2 mIU/mL                    MidCycle Peak       3.4 -  33.4 mIU/mL                    Luteal Phase        1.5 -   9.1 mIU/mL                    Post Menopausal    23.0 - 116.3 mIU/mL                    Pregnant                <   0.3 mIU/mL  Hemoglobin, fingerstick     Status: None   Collection Time: 01/07/14  4:49 PM  Result Value Ref Range   Hemoglobin, fingerstick 14.5 12.0 - 16.0 g/dL  PAP with Reflex to HPV (IPS)     Status: None   Collection Time: 01/08/14  8:09 AM  Result Value Ref Range   COMMENTS: [facility]     Comment: [place], [place] [address] [place] [address] GYN CYTOLOGY REPORT  PATIENT NAME:Vanessa Owens, Vanessa Owens PATHOLOGY#:[id number]: F DOB: 1960-08-04 (Age: 53) [place] HCWCBJ:[id number] DOCTOR:Suzanne Sabra Heck, M.D. DATE OBTAINED:8/25/2015CLIENT: [facility] RECEIVED:8/26/2015OTHER PHYS: DATE SIGNED:01/10/2014 PAP Thinlayer with Reflex to HPV Final Cytologic Interpretation:       Cervical, ThinLayer with Automated Imaging and Dual Review, CPT 88175      Negative for Intraepithelial Lesions  or Malignancy.       ADEQUACY OF SPECIMEN:           Satisfactory for evaluation. Endocervical cells/transformation zone component identified.             Electronically Signed Out Byndh/8/27/2015[facility], CT(ASCP)[address], [place], [place]. Dir.: Darren Vernona Rieger Pap test is a screening mechanism with excellent but not perfect ability to  prevent cervical carcinoma.  It has a low, but  significant, diagnostic error rate. The pap test is suboptimal  for detection of glandular lesions.  It should be noted that a negative result does not definitively rule out the presence of disease.Ref: DeMay, RM, The Art and Science of Cytopathology,  [employer], [id number]. Last Menstrual Period: 05/18/2003   POCT INR     Status: None   Collection Time: 01/23/14  9:21 AM  Result Value Ref Range   INR 3.0   POCT INR     Status: None   Collection Time: 02/28/14  9:33 AM  Result Value Ref Range   INR 3.0   POCT urinalysis dipstick (new)     Status: None   Collection Time: 03/06/14  9:03 AM  Result Value Ref Range   Color, UA yellow    Clarity, UA clear    Glucose, UA neg    Bilirubin, UA negative    Bilirubin, UA negative    Spec Grav, UA 1.015 1.005 - 1.03   Blood, UA trace-intact    pH, UA 6.5 5 - 8   Protein Ur, POC negative    Urobilinogen, UA 0.2 0 - 1   Nitrite, UA Negative    Leukocytes, UA small (1+)   Wet prep, genital     Status: None   Collection Time: 03/06/14  9:12 AM  Result Value Ref Range   Yeast Wet Prep HPF POC NONE SEEN NONE SEEN   Trich, Wet Prep NONE SEEN NONE SEEN   Clue Cells Wet Prep HPF POC NONE SEEN NONE SEEN   WBC, Wet Prep HPF POC NONE SEEN NONE SEEN  Urine culture     Status: None   Collection Time: 03/06/14  9:12 AM  Result Value Ref Range   Colony Count NO GROWTH    Organism ID, Bacteria NO GROWTH   GC/Chlamydia Probe Amp     Status: None   Collection Time: 03/06/14  9:12 AM  Result Value Ref Range   CT Probe RNA NEGATIVE    GC Probe RNA NEGATIVE     Comment:                                                                                         **Normal Reference Range: Negative**         Assay performed using the Gen-Probe APTIMA COMBO2 (R) Assay.   Acceptable specimen types for this assay include APTIMA Swabs (Unisex, endocervical, urethral, or vaginal), first void urine, and ThinPrep liquid  based cytology samples.    Assessment/Plan: Diverticulitis of colon Mild.  Patient afebrile.  Rx Augmentin.  Increase fluid. Fiber supplement.  Diverticulosis diet given to patient.  Start a daily probiotic.  Coumadin clinic  notified about antibiotic.  Patient also instructed to call Coumadin Clinic to schedule earlier lab appointment.  Follow-up if symptoms do not resolve with antibiotic.

## 2014-03-18 NOTE — Progress Notes (Signed)
Pre visit review using our clinic review tool, if applicable. No additional management support is needed unless otherwise documented below in the visit note/SLS  

## 2014-03-19 DIAGNOSIS — K5732 Diverticulitis of large intestine without perforation or abscess without bleeding: Secondary | ICD-10-CM | POA: Insufficient documentation

## 2014-03-19 NOTE — Assessment & Plan Note (Signed)
Mild.  Patient afebrile.  Rx Augmentin.  Increase fluid. Fiber supplement.  Diverticulosis diet given to patient.  Start a daily probiotic.  Coumadin clinic notified about antibiotic.  Patient also instructed to call Coumadin Clinic to schedule earlier lab appointment.  Follow-up if symptoms do not resolve with antibiotic.

## 2014-04-18 ENCOUNTER — Ambulatory Visit (INDEPENDENT_AMBULATORY_CARE_PROVIDER_SITE_OTHER): Payer: Managed Care, Other (non HMO) | Admitting: Pharmacist Clinician (PhC)/ Clinical Pharmacy Specialist

## 2014-04-18 DIAGNOSIS — Z7901 Long term (current) use of anticoagulants: Secondary | ICD-10-CM

## 2014-04-18 DIAGNOSIS — I359 Nonrheumatic aortic valve disorder, unspecified: Secondary | ICD-10-CM

## 2014-04-18 DIAGNOSIS — Z954 Presence of other heart-valve replacement: Secondary | ICD-10-CM

## 2014-04-18 DIAGNOSIS — Z952 Presence of prosthetic heart valve: Secondary | ICD-10-CM

## 2014-04-18 LAB — POCT INR: INR: 3.4

## 2014-05-26 ENCOUNTER — Other Ambulatory Visit: Payer: Self-pay | Admitting: Cardiovascular Disease

## 2014-05-30 ENCOUNTER — Ambulatory Visit (INDEPENDENT_AMBULATORY_CARE_PROVIDER_SITE_OTHER): Payer: Managed Care, Other (non HMO)

## 2014-05-30 DIAGNOSIS — Z7901 Long term (current) use of anticoagulants: Secondary | ICD-10-CM

## 2014-05-30 DIAGNOSIS — Z952 Presence of prosthetic heart valve: Secondary | ICD-10-CM

## 2014-05-30 DIAGNOSIS — I359 Nonrheumatic aortic valve disorder, unspecified: Secondary | ICD-10-CM

## 2014-05-30 DIAGNOSIS — Z954 Presence of other heart-valve replacement: Secondary | ICD-10-CM

## 2014-05-30 LAB — POCT INR: INR: 3.8

## 2014-06-27 ENCOUNTER — Ambulatory Visit (INDEPENDENT_AMBULATORY_CARE_PROVIDER_SITE_OTHER): Payer: Managed Care, Other (non HMO) | Admitting: *Deleted

## 2014-06-27 DIAGNOSIS — Z7901 Long term (current) use of anticoagulants: Secondary | ICD-10-CM

## 2014-06-27 DIAGNOSIS — Z952 Presence of prosthetic heart valve: Secondary | ICD-10-CM

## 2014-06-27 DIAGNOSIS — Z954 Presence of other heart-valve replacement: Secondary | ICD-10-CM

## 2014-06-27 DIAGNOSIS — I359 Nonrheumatic aortic valve disorder, unspecified: Secondary | ICD-10-CM

## 2014-06-27 LAB — POCT INR: INR: 2.9

## 2014-07-25 ENCOUNTER — Ambulatory Visit (INDEPENDENT_AMBULATORY_CARE_PROVIDER_SITE_OTHER): Payer: Managed Care, Other (non HMO) | Admitting: *Deleted

## 2014-07-25 DIAGNOSIS — I359 Nonrheumatic aortic valve disorder, unspecified: Secondary | ICD-10-CM

## 2014-07-25 DIAGNOSIS — Z7901 Long term (current) use of anticoagulants: Secondary | ICD-10-CM

## 2014-07-25 DIAGNOSIS — Z952 Presence of prosthetic heart valve: Secondary | ICD-10-CM

## 2014-07-25 DIAGNOSIS — Z954 Presence of other heart-valve replacement: Secondary | ICD-10-CM

## 2014-07-25 LAB — POCT INR: INR: 3.2

## 2014-08-15 ENCOUNTER — Ambulatory Visit (INDEPENDENT_AMBULATORY_CARE_PROVIDER_SITE_OTHER): Payer: Managed Care, Other (non HMO) | Admitting: *Deleted

## 2014-08-15 DIAGNOSIS — Z7901 Long term (current) use of anticoagulants: Secondary | ICD-10-CM

## 2014-08-15 DIAGNOSIS — Z954 Presence of other heart-valve replacement: Secondary | ICD-10-CM | POA: Diagnosis not present

## 2014-08-15 DIAGNOSIS — I359 Nonrheumatic aortic valve disorder, unspecified: Secondary | ICD-10-CM | POA: Diagnosis not present

## 2014-08-15 DIAGNOSIS — Z952 Presence of prosthetic heart valve: Secondary | ICD-10-CM

## 2014-08-15 LAB — POCT INR: INR: 3.2

## 2014-08-23 ENCOUNTER — Ambulatory Visit: Payer: Managed Care, Other (non HMO) | Admitting: Gastroenterology

## 2014-08-29 ENCOUNTER — Other Ambulatory Visit (INDEPENDENT_AMBULATORY_CARE_PROVIDER_SITE_OTHER): Payer: Managed Care, Other (non HMO)

## 2014-08-29 ENCOUNTER — Ambulatory Visit (INDEPENDENT_AMBULATORY_CARE_PROVIDER_SITE_OTHER)
Admission: RE | Admit: 2014-08-29 | Discharge: 2014-08-29 | Disposition: A | Discharge status: Home / Self Care | Payer: Managed Care, Other (non HMO) | Source: Ambulatory Visit | Attending: Physician Assistant | Admitting: Physician Assistant

## 2014-08-29 ENCOUNTER — Ambulatory Visit (INDEPENDENT_AMBULATORY_CARE_PROVIDER_SITE_OTHER): Payer: Managed Care, Other (non HMO) | Admitting: Physician Assistant

## 2014-08-29 ENCOUNTER — Encounter: Payer: Self-pay | Admitting: Physician Assistant

## 2014-08-29 VITALS — BP 140/72 | HR 76 | Ht 60.5 in | Wt 160.1 lb

## 2014-08-29 DIAGNOSIS — K648 Other hemorrhoids: Secondary | ICD-10-CM

## 2014-08-29 DIAGNOSIS — R197 Diarrhea, unspecified: Secondary | ICD-10-CM | POA: Diagnosis not present

## 2014-08-29 DIAGNOSIS — R1032 Left lower quadrant pain: Secondary | ICD-10-CM | POA: Diagnosis not present

## 2014-08-29 LAB — CBC WITH DIFFERENTIAL/PLATELET
Basophils Absolute: 0 10*3/uL (ref 0.0–0.1)
Basophils Relative: 0.5 % (ref 0.0–3.0)
Eosinophils Absolute: 0.1 10*3/uL (ref 0.0–0.7)
Eosinophils Relative: 1.7 % (ref 0.0–5.0)
HEMATOCRIT: 41.2 % (ref 36.0–46.0)
Hemoglobin: 14.5 g/dL (ref 12.0–15.0)
Lymphocytes Relative: 33 % (ref 12.0–46.0)
Lymphs Abs: 1.6 10*3/uL (ref 0.7–4.0)
MCHC: 35.1 g/dL (ref 30.0–36.0)
MCV: 90.4 fl (ref 78.0–100.0)
Monocytes Absolute: 0.4 10*3/uL (ref 0.1–1.0)
Monocytes Relative: 8.9 % (ref 3.0–12.0)
NEUTROS ABS: 2.8 10*3/uL (ref 1.4–7.7)
NEUTROS PCT: 55.9 % (ref 43.0–77.0)
PLATELETS: 168 10*3/uL (ref 150.0–400.0)
RBC: 4.55 Mil/uL (ref 3.87–5.11)
RDW: 12.4 % (ref 11.5–15.5)
WBC: 5 10*3/uL (ref 4.0–10.5)

## 2014-08-29 LAB — BASIC METABOLIC PANEL WITH GFR
BUN: 10 mg/dL (ref 6–23)
CO2: 31 meq/L (ref 19–32)
Calcium: 9.6 mg/dL (ref 8.4–10.5)
Chloride: 102 meq/L (ref 96–112)
Creatinine, Ser: 0.87 mg/dL (ref 0.40–1.20)
GFR: 72.53 mL/min
Glucose, Bld: 102 mg/dL — ABNORMAL HIGH (ref 70–99)
Potassium: 3.9 meq/L (ref 3.5–5.1)
Sodium: 137 meq/L (ref 135–145)

## 2014-08-29 MED ORDER — IOHEXOL 300 MG/ML  SOLN
100.0000 mL | Freq: Once | INTRAMUSCULAR | Status: AC | PRN
  Administered 2014-08-29: 100 mL via INTRAVENOUS

## 2014-08-29 NOTE — Progress Notes (Signed)
Reviewed and agree with management. Jayr Lupercio D. Areej Tayler, M.D., FACG  

## 2014-08-29 NOTE — Patient Instructions (Signed)
Your physician has requested that you go to the basement for the following lab work before leaving today:  CBC, BMET  You have been scheduled for a CT scan of the abdomen and pelvis at [facility] ([address]---this is in the same building as Press [facility]).   You are scheduled on 08/29/2014 at 4:15pm. You should arrive 15 minutes prior to your appointment time for registration. Please follow the written instructions below on the day of your exam:  WARNING: IF YOU ARE ALLERGIC TO IODINE/X-RAY DYE, PLEASE NOTIFY RADIOLOGY IMMEDIATELY AT [phone]! YOU WILL BE GIVEN A 13 HOUR PREMEDICATION PREP.  1) Do not eat or drink anything after 12:15pm (4 hours prior to your test) 2) You have been given 2 bottles of oral contrast to drink. The solution may taste better if refrigerated, but do NOT add ice or any other liquid to this solution. Shake well before drinking.    Drink 1 bottle of contrast @ 2:15pm (2 hours prior to your exam)  Drink 1 bottle of contrast @ 3:15pm (1 hour prior to your exam)  You may take any medications as prescribed with a small amount of water except for the following: Metformin, Glucophage, Glucovance, Avandamet, Riomet, Fortamet, Actoplus Met, Janumet, Glumetza or Metaglip. The above medications must be held the day of the exam AND 48 hours after the exam.  The purpose of you drinking the oral contrast is to aid in the visualization of your intestinal tract. The contrast solution may cause some diarrhea. Before your exam is started, you will be given a small amount of fluid to drink. Depending on your individual set of symptoms, you may also receive an intravenous injection of x-ray contrast/dye. Plan on being at [facility] for 30 minutes or long, depending on the type of exam you are having performed.  If you have any questions regarding your exam or if you need to reschedule, you may call the CT department at [phone] between the hours of 8:00  am and 5:00 pm, Monday-Friday.  ________________________________________________________________________    As discussed with Cecille Rubin, clear liquids for 24 hours and then you may use the low residue and low fat diet

## 2014-08-29 NOTE — Progress Notes (Signed)
Patient ID: Vanessa Owens, female   DOB: May 25, 1960, 54 y.o.   MRN: [id number]     History of Present Illness: Vanessa Owens known to Dr. [name] from a colonoscopy in July 2015. At the time of her colonoscopy she was noted to have diverticulosis and several colon polyps. She was advised to have surveillance colonoscopy in 5 years. She is status post aVR in 2001 and is on Coumadin for that. Her Coumadin is managed by the Coumadin clinic. She is here today with complaints of left lower quadrant pain. She states that in the fall she began to have left lower quadrant pain. She was seen in an urgent care clinic and advised to follow up with her PCP, Dr. Dimas Millin. She saw Elyn Aquas in, PA-C, and was treated empirically for diverticulitis with a course of Augmentin. She had relief of her symptoms for a short period of time, but in late January her pain came back. Since February her pain has been fairly constant but over the past week has become more severe. Her stools are occasionally diarrhea and liquidy. She had a formed stool this morning. She notes if she increases her fiber her stool consistency improves. She does feel excessively bloated and gassy and says she has been passing large amounts of foul smelling gas. She has no nausea or vomiting. She denies fever but has occasional chills. She has no dysuria. Last menstrual period over 20 years ago. She moved to a new home in February that has well water and she questions if this may add to the etiology of her change in bowel habits. She has no rectal pain but occasionally has a sensation of incomplete evacuation. She rates her left lower quadrant pain as an 8 on a scale of 1-10.   Past Medical History  Diagnosis Date  . Aortic stenosis      by Dr. Prescott Gum  . 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 CHEST PAIN UNSPECIFIED     Qualifier: Diagnosis of  By: Burnett Harry MD, Shanon Brow    . Long term current use of anticoagulant   . Shortness of breath     Qualifier: Diagnosis of  By: Allean Found, LPN, [name]    . Anemia   . Vulvar ulceration     recurrent  . OAB (overactive bladder)   . Osteopenia   . Migraine   . Costochondritis     x2  . Shingles     ? diag/rash 11/21/12  . Rash   . Colon polyp   . Diverticulosis     Past Surgical History  Procedure Laterality Date  . Aortic valve replacement  2001    St Jude  . Cesarean section    . Appendectomy      post op abdominal wall hematoma/2 transfusions  . Breast biopsy  5/08    left breast   Family History  Problem Relation Age of Onset  . Hypertension Mother   . Diabetes Mother   . Hyperlipidemia Mother   . Diabetes Brother   . Hyperlipidemia Brother   . Hypertension Brother   . Diabetes Other   . Diabetes Son   . Alzheimer's disease Maternal Grandfather    History  Substance Use Topics  . Smoking status: Never Smoker   . Smokeless tobacco: Never Used  . Alcohol Use: 0.5 - 1.0 oz/week    1-2 drink(s) per week     Comment: occ   Current Outpatient Prescriptions  Medication Sig Dispense Refill  . amoxicillin-clavulanate (AUGMENTIN) 875-125 MG per tablet Take 1 tablet by mouth 2 (two) times daily. (Patient taking differently: Take 1 tablet by mouth as needed. Takes as needed for dental work) 20 tablet 0  . Calcium Carbonate-Vitamin D (CALCIUM-VITAMIN D) 500-200 MG-UNIT per tablet Take 1 tablet by mouth daily. Vitafusion- Take 2 gummies    . COUMADIN 7.5 MG tablet TAKE AS DIRECTED BY ANTICOAGULATION CLINIC 90 tablet 1  . FIBER PO Take by mouth. Gummies-daily    . Multiple Vitamins-Minerals (MULTIVITAMIN GUMMIES ADULTS PO) Take by mouth daily. Pt takes 2 gummies a day    . omega-3 acid ethyl esters (LOVAZA) 1 G capsule Take by mouth daily.    . Probiotic Product (PROBIOTIC DAILY PO) Take 1 tablet by mouth daily. Pt takes Culterelle Brand    . Simethicone (PHAZYME PO) Take 1 tablet by mouth as needed. Pt takes for gas     No current facility-administered medications for  this visit.   Allergies  Allergen Reactions  . Morphine     REACTION: sick      Review of Systems: Gen: Denies any fever, chills, sweats, anorexia, fatigue, weakness, malaise, weight loss, and sleep disorder CV: Denies chest pain, angina, palpitations, syncope, orthopnea, PND, peripheral edema, and claudication. Resp: Denies dyspnea at rest, dyspnea with exercise, cough, sputum, wheezing, coughing up blood, and pleurisy. GI: Denies vomiting blood, jaundice, and fecal incontinence.   Denies dysphagia or odynophagia. GU : Denies urinary burning, blood in urine, urinary frequency, urinary hesitancy, nocturnal urination, and urinary incontinence. MS: Denies joint pain, limitation of movement, and swelling, stiffness, low back pain, extremity pain. Denies muscle weakness, cramps, atrophy.  Derm: Denies rash, itching, dry skin, hives, moles, warts, or unhealing ulcers.  Psych: Denies depression, anxiety, memory loss, suicidal ideation, hallucinations, paranoia, and confusion. Heme: Denies bruising, bleeding, and enlarged lymph nodes. Neuro:  Denies any headaches, dizziness, paresthesia Endo:  Denies any problems with DM, thyroid, adrenal  ENDOSCOPIES: ENDOSCOPIC IMPRESSION: 1. colon polyps 2. diverticulosis RECOMMENDATIONS: If the polyp(s) removed today are proven to be adenomatous (pre-cancerous) polyps, you will need a repeat colonoscopy in 5 years. Otherwise you should continue to follow colorectal cancer screening guidelines for "routine risk" patients with colonoscopy in 10 years. You will receive a letter within 1-2 weeks with the results of your biopsy as well as final Page 1 of 2 recommendations. Please call my office if you have not received a letter after 3 weeks. resume Coumadin and Lovenox in a.m.  Physical Exam: General: Pleasant, well developed , female in no acute distress Head: Normocephalic and atraumatic Eyes:  sclerae anicteric, conjunctiva pink  Ears: Normal  auditory acuity Lungs: Clear throughout to auscultation Heart: Regular rate and rhythm, valve click noted Abd soft, nondistended, tender left lower quadrant and suprapubic area with guarding but no rebound, no CVAT, no hepatosplenomegaly, normal bowel sounds. Rectal scant brown stool heme negative. No external hemorrhoids noted Extremities: No edema  Neurological: Alert oriented x 4, grossly nonfocal Psychological:  Alert and cooperative. Normal mood and affect  Assessment and Recommendations:  54 year old female with known diverticular disease now with a 2 month history of left lower quadrant pain that has become severe over the past week here for evaluation. A CBC and basic metabolic panel will be obtained. She will be scheduled for a CT of the abdomen and pelvis. If her CT is suggestive of diverticulitis or if her white blood count is elevated she will be treated with  a course of Cipro and Flagyl and the Coumadin clinic will be noted so that her Coumadin may be appropriately adjusted. She's been instructed to adhere to a clear liquid diet today and then a low fat low residue diet. If her CT is negative, she may be considered for a trial of Xifaxan. She will also be prescribed an antispasmodic such as Levsin. She may also use Anusol HCC suppositories 1 per rectum twice a day as needed for internal hemorrhoids which are likely the cause of her sensation of incomplete evacuation.       Latosha Gaylord, Vita Barley PA-C 08/29/2014,  CC: Leeanne Rio, PA-C.

## 2014-08-30 ENCOUNTER — Other Ambulatory Visit: Payer: Self-pay

## 2014-08-30 MED ORDER — HYDROCORTISONE ACETATE 25 MG RE SUPP: 25.0000 mg | Freq: Two times a day (BID) | RECTAL | Status: DC

## 2014-08-30 MED ORDER — DICYCLOMINE HCL 10 MG PO CAPS
ORAL_CAPSULE | ORAL | Status: DC
Start: 2014-08-30 — End: 2014-10-16

## 2014-08-30 MED ORDER — RIFAXIMIN 550 MG PO TABS: 550.0000 mg | ORAL_TABLET | Freq: Three times a day (TID) | ORAL | Status: DC

## 2014-09-02 ENCOUNTER — Telehealth: Payer: Self-pay | Admitting: Cardiovascular Disease

## 2014-09-02 NOTE — Telephone Encounter (Signed)
New Message  Pt wanted to speak w/ Coumadin about a drug interaction. Please call back and discuss.

## 2014-09-02 NOTE — Telephone Encounter (Signed)
Pt states she has been placed on Xifaxan and Bentyl on Saturday April 16th Informed that will call and check with out Pharmacist.  After speaking with our Pharmacist Cyril Mourning she states Xifaxan may decrease effectivenss of coumadin so pt called and appt changed for her to be seen in clinic on Friday April 22nd

## 2014-09-03 ENCOUNTER — Telehealth: Payer: Self-pay | Admitting: Gastroenterology

## 2014-09-03 NOTE — Telephone Encounter (Signed)
I have left message for the patient to call back She was given the following instructions on 08/29/14 : Notes Recorded by Vita Barley Hvozdovic, PA-C on 08/29/2014 at 10:27 PM Please the patient now CBC normal and CT scan with diverticulosis but no diverticulitis. Please have her use Xifaxan 550 mg 3 times daily for 14 days. She may use Bentyl 10 mg one to 2 capsules 3 times daily as needed, #90 with 1 refill. For her hemorrhoids she can use Anusol HC suppositories, 1 per rectum twice daily for 10 days, #20 with no refill. Schedule follow-up in one and a half to 2 months, sooner if needed.

## 2014-09-09 NOTE — Telephone Encounter (Signed)
I have left message for the patient to call back 

## 2014-09-09 NOTE — Telephone Encounter (Signed)
Spoke with the patient and reviewed her instructions. Also confirmed her follow up appointment.

## 2014-09-10 ENCOUNTER — Ambulatory Visit (INDEPENDENT_AMBULATORY_CARE_PROVIDER_SITE_OTHER): Payer: Managed Care, Other (non HMO)

## 2014-09-10 DIAGNOSIS — I359 Nonrheumatic aortic valve disorder, unspecified: Secondary | ICD-10-CM

## 2014-09-10 DIAGNOSIS — Z7901 Long term (current) use of anticoagulants: Secondary | ICD-10-CM | POA: Diagnosis not present

## 2014-09-10 DIAGNOSIS — Z954 Presence of other heart-valve replacement: Secondary | ICD-10-CM

## 2014-09-10 DIAGNOSIS — Z952 Presence of prosthetic heart valve: Secondary | ICD-10-CM

## 2014-09-10 LAB — POCT INR: INR: 2.6

## 2014-09-13 ENCOUNTER — Other Ambulatory Visit: Payer: Self-pay

## 2014-09-13 DIAGNOSIS — Z1231 Encounter for screening mammogram for malignant neoplasm of breast: Secondary | ICD-10-CM

## 2014-09-25 ENCOUNTER — Ambulatory Visit
Admission: RE | Admit: 2014-09-25 | Discharge: 2014-09-25 | Disposition: A | Discharge status: Home / Self Care | Payer: 59 | Source: Ambulatory Visit

## 2014-09-25 ENCOUNTER — Ambulatory Visit
Admission: RE | Admit: 2014-09-25 | Discharge: 2014-09-25 | Disposition: A | Discharge status: Home / Self Care | Payer: 59 | Source: Ambulatory Visit | Attending: Obstetrics & Gynecology | Admitting: Obstetrics & Gynecology

## 2014-09-25 DIAGNOSIS — Z1231 Encounter for screening mammogram for malignant neoplasm of breast: Secondary | ICD-10-CM

## 2014-09-25 DIAGNOSIS — M8580 Other specified disorders of bone density and structure, unspecified site: Secondary | ICD-10-CM

## 2014-09-30 ENCOUNTER — Telehealth: Payer: Self-pay

## 2014-09-30 NOTE — Telephone Encounter (Signed)
-----   Message from Megan Salon, MD sent at 09/30/2014  9:52 AM EDT ----- Please inform pt her BMD showed osteopenia in the hip and osteoporosis in her spine.  She needs a consultation to discuss options.

## 2014-09-30 NOTE — Telephone Encounter (Signed)
Lmtcb//kn 

## 2014-10-01 NOTE — Telephone Encounter (Signed)
Patient notified of results. Appointment to discuss scheduled in 6/16//kn

## 2014-10-07 ENCOUNTER — Encounter: Payer: Self-pay | Admitting: Gastroenterology

## 2014-10-07 ENCOUNTER — Ambulatory Visit (INDEPENDENT_AMBULATORY_CARE_PROVIDER_SITE_OTHER): Payer: 59 | Admitting: Gastroenterology

## 2014-10-07 VITALS — BP 128/80 | HR 76 | Ht 60.5 in | Wt 160.0 lb

## 2014-10-07 DIAGNOSIS — R1032 Left lower quadrant pain: Secondary | ICD-10-CM | POA: Diagnosis not present

## 2014-10-07 DIAGNOSIS — K589 Irritable bowel syndrome without diarrhea: Secondary | ICD-10-CM

## 2014-10-07 MED ORDER — ELUXADOLINE 100 MG PO TABS: 100.0000 mg | ORAL_TABLET | Freq: Two times a day (BID) | ORAL | Status: DC

## 2014-10-07 NOTE — Progress Notes (Signed)
      History of Present Illness:  [name] continues to complain of pain that is actually in the area of the left flank.  CT scan, which I reviewed, was unrevealing.  Shows has frequent diarrhea about 4 days out of the week that is accompanied by urgency.  Antibiotic therapy for presumed diverticulitis did not improve her symptoms.  She denies dysuria or urinary frequency.  Review of Systems: Pertinent positive and negative review of systems were noted in the above HPI section. All other review of systems were otherwise negative.    Current Medications, Allergies, Past Medical History, Past Surgical History, Family History and Social History were reviewed in [place] record  Vital signs were reviewed in today's medical record. Physical Exam: General: Well developed , well nourished, no acute distress Skin: anicteric Head: Normocephalic and atraumatic Eyes:  sclerae anicteric, EOMI Ears: Normal auditory acuity Mouth: No deformity or lesions Lymph Nodes: no lymphadenopathy Lungs: Clear throughout to auscultation Heart: Regular rate and rhythm; no murmurs, rubs or brui: Gastroinestinal:  Soft, non tender and non distended. No masses, hepatosplenomegaly or hernias noted. Normal Bowel sounds.  There is no CVA tenderness Rectal:deferred Musculoskeletal: Symmetrical with no gross deformities  Pulses:  Normal pulses noted Extremities: No clubbing, cyanosis, edema or deformities noted Neurological: Alert oriented x 4, grossly nonfocal Psychological:  Alert and cooperative. Normal mood and affect  See Assessment and Plan under Problem List

## 2014-10-07 NOTE — Assessment & Plan Note (Signed)
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  She is intolerant of fast foods which likely are high in fat content.  She has some lower abdominal pain which is associated with diarrhea.  Recon  Recommendations #1 trial of Viberzi 100 mg twice a day

## 2014-10-07 NOTE — Assessment & Plan Note (Signed)
Left lower quadrant pain actually appears to be pain closer to the left flank.  This may be musculoskeletal pain, perhaps radicular pain.  At this point I think it is less likely visceral pain.  Recommendations #1 pending results of therapy with Viberzi I will try her on a course of Clinoril

## 2014-10-07 NOTE — Patient Instructions (Signed)
We are sending in a prescription to your pharmacy Call back in 2 weeks to report progress Call back for a follow up appointment in 2 months

## 2014-10-08 ENCOUNTER — Telehealth: Payer: Self-pay | Admitting: Gastroenterology

## 2014-10-08 DIAGNOSIS — K589 Irritable bowel syndrome without diarrhea: Secondary | ICD-10-CM

## 2014-10-08 MED ORDER — ELUXADOLINE 100 MG PO TABS: 100.0000 mg | ORAL_TABLET | Freq: Two times a day (BID) | ORAL | Status: DC

## 2014-10-08 NOTE — Telephone Encounter (Signed)
Called patient and left message that mediation was faxed in to [facility] on [facility].

## 2014-10-13 NOTE — Progress Notes (Addendum)
Patient ID: Vanessa Owens, female   DOB: 1961/03/19, 54 y.o.   MRN: [id number] Vanessa Owens returns today for F/U post AVR in 2001 for bicuspid valve. Previously followed by Dr Vanessa Owens. Last echo reviewed from 09/29/10 with mild LVH, normal EF, mean gradient across AVR 31mmHg/peak 44mmHg and mild AR. Needs a root canal. SBE prophylaxis discussed.  Complained about facility fee with last echo. May try to have it at [facility] next year.  Echo 10/23/13  EF 55-60% Mean gradient 28 peak 52 mmHg stable since 2012   Seeing Vanessa Owens for LLQ pain   Moved into new house and having more hip and leg arthritic pain   ROS: Denies fever, malais, weight loss, blurry vision, decreased visual acuity, cough, sputum, SOB, hemoptysis, pleuritic pain, palpitaitons, heartburn, abdominal pain, melena, lower extremity edema, claudication, or rash.  All other systems reviewed and negative  General: Affect appropriate Healthy:  appears stated age 53: normal Neck supple with no adenopathy JVP normal no bruits no thyromegaly Lungs clear with no wheezing and good diaphragmatic motion Heart:  K4/Q2 click  SEM no AR  murmur, no rub, gallop or click PMI normal Abdomen: benighn, BS positve, no tenderness, no AAA no bruit.  No HSM or HJR Distal pulses intact with no bruits No edema Neuro non-focal Skin warm and dry No muscular weakness   Current Outpatient Prescriptions  Medication Sig Dispense Refill  . amoxicillin-clavulanate (AUGMENTIN) 875-125 MG per tablet Take 1 tablet by mouth 2 (two) times daily. 20 tablet 0  . Calcium Carbonate-Vitamin D (CALCIUM-VITAMIN D) 500-200 MG-UNIT per tablet Take 1 tablet by mouth daily. Vitafusion- Take 2 gummies    . COUMADIN 7.5 MG tablet TAKE AS DIRECTED BY ANTICOAGULATION CLINIC 90 tablet 1  . Eluxadoline (VIBERZI) 100 MG TABS Take 100 mg by mouth 2 (two) times daily. 60 tablet 2  . FIBER PO Take by mouth. Gummies-daily    . Multiple Vitamins-Minerals (MULTIVITAMIN GUMMIES ADULTS  PO) Take by mouth daily. Pt takes 2 gummies a day    . omega-3 acid ethyl esters (LOVAZA) 1 G capsule Take by mouth daily.    . Simethicone (PHAZYME PO) Take 1 tablet by mouth as needed. Pt takes for gas     No current facility-administered medications for this visit.    Allergies  Morphine  Electrocardiogram: 09/19/12  SR normal   10/16/14  SR rate 71 normal no changes from last year   Assessment and Plan AVR:  2001 for bicuspid valve no perivalvular regurgitation SBE  F/u echo in a year  LLQ Pain:  F/u Vanessa Owens on viberzi  ? IBS Anticoagulation no bleeding issues  Coumadin clinic today   [name]

## 2014-10-16 ENCOUNTER — Encounter: Payer: Self-pay | Admitting: Cardiovascular Disease

## 2014-10-16 ENCOUNTER — Ambulatory Visit (INDEPENDENT_AMBULATORY_CARE_PROVIDER_SITE_OTHER): Payer: 59 | Admitting: Cardiovascular Disease

## 2014-10-16 ENCOUNTER — Ambulatory Visit (INDEPENDENT_AMBULATORY_CARE_PROVIDER_SITE_OTHER): Payer: 59 | Admitting: *Deleted

## 2014-10-16 VITALS — BP 132/80 | HR 71 | Ht 61.0 in | Wt 161.8 lb

## 2014-10-16 DIAGNOSIS — Z7901 Long term (current) use of anticoagulants: Secondary | ICD-10-CM

## 2014-10-16 DIAGNOSIS — Z954 Presence of other heart-valve replacement: Secondary | ICD-10-CM

## 2014-10-16 DIAGNOSIS — I359 Nonrheumatic aortic valve disorder, unspecified: Secondary | ICD-10-CM

## 2014-10-16 DIAGNOSIS — Z952 Presence of prosthetic heart valve: Secondary | ICD-10-CM

## 2014-10-16 LAB — POCT INR: INR: 4

## 2014-10-16 MED ORDER — COUMADIN 7.5 MG PO TABS: 7.5000 mg | ORAL_TABLET | ORAL | Status: DC

## 2014-10-16 NOTE — Patient Instructions (Signed)
Medication Instructions:  NO CHANGES  Labwork: NONE  Testing/Procedures: Your physician has requested that you have an echocardiogram. Echocardiography is a painless test that uses sound waves to create images of your heart. It provides your doctor with information about the size and shape of your heart and how well your heart's chambers and valves are working. This procedure takes approximately one hour. There are no restrictions for this procedure. IN  A YEAR   Follow-Up: Your physician wants you to follow-up in:  [place] will receive a reminder letter in the mail two months in advance. If you don't receive a letter, please call our office to schedule the follow-up appointment.  Any Other Special Instructions Will Be Listed Below (If Applicable).

## 2014-10-24 ENCOUNTER — Telehealth: Payer: Self-pay | Admitting: Obstetrics & Gynecology

## 2014-10-24 NOTE — Telephone Encounter (Signed)
Spoke with patient. Appointment for BMD rescheduled to 11/15/2014 at 3pm with Dr.Miller. Patient is agreeable to date and time.  Routing to provider for final review. Patient agreeable to disposition. Will close encounter.

## 2014-10-24 NOTE — Telephone Encounter (Signed)
Patient states she wants to cancel appointment. Patient wants call back to discuss details if she changes appointment when will she be reschduled

## 2014-10-25 ENCOUNTER — Institutional Professional Consult (permissible substitution): Payer: Managed Care, Other (non HMO) | Admitting: Obstetrics & Gynecology

## 2014-11-13 ENCOUNTER — Ambulatory Visit (INDEPENDENT_AMBULATORY_CARE_PROVIDER_SITE_OTHER): Payer: 59 | Admitting: *Deleted

## 2014-11-13 DIAGNOSIS — Z7901 Long term (current) use of anticoagulants: Secondary | ICD-10-CM

## 2014-11-13 DIAGNOSIS — I359 Nonrheumatic aortic valve disorder, unspecified: Secondary | ICD-10-CM

## 2014-11-13 DIAGNOSIS — Z954 Presence of other heart-valve replacement: Secondary | ICD-10-CM | POA: Diagnosis not present

## 2014-11-13 DIAGNOSIS — Z952 Presence of prosthetic heart valve: Secondary | ICD-10-CM

## 2014-11-13 LAB — POCT INR: INR: 3.2

## 2014-11-15 ENCOUNTER — Encounter: Payer: Self-pay | Admitting: Obstetrics & Gynecology

## 2014-11-15 ENCOUNTER — Ambulatory Visit (INDEPENDENT_AMBULATORY_CARE_PROVIDER_SITE_OTHER): Payer: Managed Care, Other (non HMO) | Admitting: Obstetrics & Gynecology

## 2014-11-15 VITALS — BP 118/80 | HR 60 | Resp 16 | Ht 61.0 in | Wt 161.0 lb

## 2014-11-15 DIAGNOSIS — M810 Age-related osteoporosis without current pathological fracture: Secondary | ICD-10-CM | POA: Diagnosis not present

## 2014-11-15 NOTE — Progress Notes (Signed)
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  Pt on coumadin after heart valve surgery and has been on for years.  Osteoporosis Risk Factors  Nonmodifiable Personal Hx of fracture as an adult: no Caucasian race: yes Advanced age: no Female sex: yes Dementia: no Poor health/frailty: no  Potentially modifiable: Tobacco use: no Low body weight (<127 lbs): no Estrogen deficiency  early menopause (age <45) or bilateral ovariectomy: no  prolonged premenopausal amenorrhea (>1 yr): no Low calcium intake (lifelong): no Alcohol use more than 2 drinks per day: no Recurrent falls: no Inadequate physical activity: no  Current calcium and Vit D intake:  Calcium 500mg  and Vit D 200IU daily  Review of Systems A comprehensive review of systems was negative.     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  Patient counseled in adequate calcium and vitamin D exposure.  Calcium - 500 - 1000 mg elemental calcium/day in divided doses  Vitamin D - 800 IU/day  Told not to take at same time as bisphosphonate. 2.  Exercise recommended at least 30 minutes 3 times per week.  3.  Recommendation to avoid heavy ETOH use.  4.  Counseled to avoid tobacco and second had smoke. 5.  Fall prevention discussed. 6.  Pharmacologic therapy options reviewed.  Decided not to start anything this year.  Will repeat BMD in 2 years and check PTH with calcium, TSH, Vit D, and CMP today.  Pt in agreement with plan.     ~15 minutes spent with patient >50% of time was in face to face discussion of above.

## 2014-11-16 LAB — TSH: TSH: 2.234 u[IU]/mL (ref 0.350–4.500)

## 2014-11-16 LAB — COMPREHENSIVE METABOLIC PANEL
ALK PHOS: 73 U/L (ref 39–117)
ALT: 22 U/L (ref 0–35)
AST: 24 U/L (ref 0–37)
Albumin: 4.3 g/dL (ref 3.5–5.2)
BILIRUBIN TOTAL: 1.1 mg/dL (ref 0.2–1.2)
BUN: 16 mg/dL (ref 6–23)
CALCIUM: 8.8 mg/dL (ref 8.4–10.5)
CHLORIDE: 104 meq/L (ref 96–112)
CO2: 26 mEq/L (ref 19–32)
CREATININE: 0.81 mg/dL (ref 0.50–1.10)
Glucose, Bld: 99 mg/dL (ref 70–99)
Potassium: 4.1 mEq/L (ref 3.5–5.3)
SODIUM: 142 meq/L (ref 135–145)
TOTAL PROTEIN: 6.8 g/dL (ref 6.0–8.3)

## 2014-11-16 LAB — VITAMIN D 25 HYDROXY (VIT D DEFICIENCY, FRACTURES): Vit D, 25-Hydroxy: 40 ng/mL (ref 30–100)

## 2014-11-19 LAB — PTH, INTACT AND CALCIUM
Calcium: 9.2 mg/dL (ref 8.4–10.5)
PTH: 50 pg/mL (ref 14–64)

## 2014-11-21 ENCOUNTER — Telehealth: Payer: Self-pay

## 2014-11-21 NOTE — Telephone Encounter (Signed)
Lmtcb//kn 

## 2014-11-21 NOTE — Telephone Encounter (Signed)
-----   Message from Megan Salon, MD sent at 11/21/2014  9:06 AM EDT ----- Please inform pt the PTH, calcium, Vit D, and TSH were all normal.  Plan to repeat BMD 2 years.  I will make sure this is scheduled when she comes for annual exam.  Thanks.

## 2014-11-28 DIAGNOSIS — M810 Age-related osteoporosis without current pathological fracture: Secondary | ICD-10-CM | POA: Insufficient documentation

## 2014-12-18 ENCOUNTER — Ambulatory Visit (INDEPENDENT_AMBULATORY_CARE_PROVIDER_SITE_OTHER): Payer: 59 | Admitting: *Deleted

## 2014-12-18 DIAGNOSIS — Z954 Presence of other heart-valve replacement: Secondary | ICD-10-CM

## 2014-12-18 DIAGNOSIS — Z7901 Long term (current) use of anticoagulants: Secondary | ICD-10-CM

## 2014-12-18 DIAGNOSIS — I359 Nonrheumatic aortic valve disorder, unspecified: Secondary | ICD-10-CM

## 2014-12-18 DIAGNOSIS — Z952 Presence of prosthetic heart valve: Secondary | ICD-10-CM

## 2014-12-18 LAB — POCT INR: INR: 3.4

## 2015-01-06 NOTE — Telephone Encounter (Signed)
Patient notified of all results.//kn 

## 2015-01-15 ENCOUNTER — Ambulatory Visit (INDEPENDENT_AMBULATORY_CARE_PROVIDER_SITE_OTHER): Payer: 59 | Admitting: Pharmacist

## 2015-01-15 DIAGNOSIS — Z954 Presence of other heart-valve replacement: Secondary | ICD-10-CM | POA: Diagnosis not present

## 2015-01-15 DIAGNOSIS — I359 Nonrheumatic aortic valve disorder, unspecified: Secondary | ICD-10-CM

## 2015-01-15 DIAGNOSIS — Z952 Presence of prosthetic heart valve: Secondary | ICD-10-CM

## 2015-01-15 DIAGNOSIS — Z7901 Long term (current) use of anticoagulants: Secondary | ICD-10-CM

## 2015-01-15 LAB — POCT INR: INR: 3.1

## 2015-02-04 ENCOUNTER — Ambulatory Visit (INDEPENDENT_AMBULATORY_CARE_PROVIDER_SITE_OTHER): Payer: Managed Care, Other (non HMO) | Admitting: Obstetrics & Gynecology

## 2015-02-04 ENCOUNTER — Encounter: Payer: Self-pay | Admitting: Obstetrics & Gynecology

## 2015-02-04 VITALS — BP 124/62 | HR 68 | Resp 16 | Ht 60.5 in | Wt 163.0 lb

## 2015-02-04 DIAGNOSIS — Z01419 Encounter for gynecological examination (general) (routine) without abnormal findings: Secondary | ICD-10-CM | POA: Diagnosis not present

## 2015-02-04 DIAGNOSIS — Z0000 Encounter for general adult medical examination without abnormal findings: Secondary | ICD-10-CM | POA: Diagnosis not present

## 2015-02-04 LAB — POCT URINALYSIS DIPSTICK
Bilirubin, UA: NEGATIVE
Blood, UA: NEGATIVE
Glucose, UA: NEGATIVE
Ketones, UA: NEGATIVE
Leukocytes, UA: NEGATIVE
NITRITE UA: NEGATIVE
PH UA: 8
Protein, UA: NEGATIVE
UROBILINOGEN UA: NEGATIVE

## 2015-02-04 NOTE — Progress Notes (Signed)
54 y.o. G2P2 SingleCaucasianF here for annual exam.  Pt doing well.  No vaginal bleeding.  She saw Dr. Johnsie Cancel in June.    Pt reports she is still having some abdominal cramping improved.  She had a colonoscopy 2015.  Also had a CT scan 4/16.  Has been diagnosed with IBS.  Stopped micronor in Feb.  No bleeding.  Is having some increased vaginal dryness.   Patient's last menstrual period was 05/18/2003.          Sexually active: Yes.    The current method of family planning is none.    Exercising: No.  not regularly Smoker:  no  Health Maintenance: Pap:  01/08/14 WNL History of abnormal Pap:  Yes years ago MMG:  09/25/14 BiRads 1-negative Colonoscopy:  2015-repeat in 5 years.  Had adenomatous polyp removed.   BMD:   09/25/14-osteoporosis TDaP:  10/30/09 Screening Labs: at work, Hb today: not done, Urine today: PH-8.0   reports that she has never smoked. She has never used smokeless tobacco. She reports that she drinks about 0.5 - 1.0 oz of alcohol per week. She reports that she does not use illicit drugs.  Past Medical History  Diagnosis Date  . Aortic stenosis      by Dr. Prescott Gum  . 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 CHEST PAIN UNSPECIFIED     Qualifier: Diagnosis of  By: Burnett Harry MD, Shanon Brow    . Long term current use of anticoagulant   . Shortness of breath     Qualifier: Diagnosis of  By: Allean Found, LPN, [name]    . Anemia   . Vulvar ulceration     recurrent  . OAB (overactive bladder)   . Osteopenia   . Migraine   . Costochondritis     x2  . Shingles     ? diag/rash 11/21/12  . Rash   . Colon polyp   . Diverticulosis   . Osteoporosis   . IBS (irritable bowel syndrome)     Past Surgical History  Procedure Laterality Date  . Aortic valve replacement  2001    St Jude  . Cesarean section    . Appendectomy      post op abdominal wall hematoma/2 transfusions  . Breast biopsy  5/08    left breast    Current Outpatient Prescriptions  Medication Sig  Dispense Refill  . amoxicillin (AMOXIL) 500 MG capsule TAKE 4 CAPSULES BY MOUTH I HOUR PRIOR TO APPOINTMENT  3  . Calcium Carbonate-Vitamin D (CALCIUM-VITAMIN D) 500-200 MG-UNIT per tablet Take 1 tablet by mouth daily. Vitafusion- Take 2 gummies    . COUMADIN 7.5 MG tablet Take 1 tablet (7.5 mg total) by mouth as directed. 90 tablet 1  . Eluxadoline (VIBERZI) 100 MG TABS Take 100 mg by mouth 2 (two) times daily. 60 tablet 2  . FIBER PO Take by mouth. Gummies-daily    . Multiple Vitamins-Minerals (MULTIVITAMIN GUMMIES ADULTS PO) Take by mouth daily. Pt takes 2 gummies a day    . omega-3 acid ethyl esters (LOVAZA) 1 G capsule Take by mouth daily.    . Simethicone (PHAZYME PO) Take 1 tablet by mouth as needed. Pt takes for gas     No current facility-administered medications for this visit.    Family History  Problem Relation Age of Onset  . Hypertension Mother   . Diabetes Mother   . Hyperlipidemia Mother   . Diabetes Brother   .  Hyperlipidemia Brother   . Hypertension Brother   . Diabetes Other   . Diabetes Son   . Alzheimer's disease Maternal Grandfather     ROS:  Pertinent items are noted in HPI.  Otherwise, a comprehensive ROS was negative.  Exam:   BP 124/62 mmHg  Pulse 68  Resp 16  Ht 5' 0.5" (1.537 m)  Wt 163 lb (73.936 kg)  BMI 31.30 kg/m2  LMP 05/18/2003  Weight change: +17#   Height: 5' 0.5" (153.7 cm)  Ht Readings from Last 3 Encounters:  02/04/15 5' 0.5" (1.537 m)  11/15/14 5\' 1"  (1.549 m)  10/16/14 5\' 1"  (1.549 m)    General appearance: alert, cooperative and appears stated age Head: Normocephalic, without obvious abnormality, atraumatic Neck: no adenopathy, supple, symmetrical, trachea midline and thyroid normal to inspection and palpation Lungs: clear to auscultation bilaterally Breasts: normal appearance, no masses or tenderness Abdomen: soft, non-tender; bowel sounds normal; no masses,  no organomegaly Extremities: extremities normal, atraumatic, no  cyanosis or edema Skin: Skin color, texture, turgor normal. No rashes or lesions Lymph nodes: Cervical, supraclavicular, and axillary nodes normal. No abnormal inguinal nodes palpated Neurologic: Grossly normal   Pelvic: External genitalia:  no lesions              Urethra:  normal appearing urethra with no masses, tenderness or lesions              Bartholins and Skenes: normal                 Vagina: normal appearing vagina with normal color and discharge, no lesions              Cervix: no lesions              Pap taken: No. Bimanual Exam:  Uterus:  normal size, contour, position, consistency, mobility, non-tender              Adnexa: normal adnexa and no mass, fullness, tenderness               Rectovaginal: Confirms               Anus:  normal sphincter tone, no lesions  Chaperone was present for exam.  A:  Well Woman with normal exam  Off micronor after having two FSH values of 65 and 69 Aortic valve replacement on chronic anticoagulation  Osteoporosis.  Declined treatment.  Planning repeat BMD 2 years. OAB  IBS Diverticulosis   P: Mammogram yearly pap smear with neg HR HPV last year 2013.Neg pap 2015.  No pap today. Options for vaginal dryness discussed. return annually or prn

## 2015-02-04 NOTE — Patient Instructions (Signed)
Olive oil or coconut or Replens vaginal moisturizer.

## 2015-02-08 ENCOUNTER — Other Ambulatory Visit: Payer: Self-pay | Admitting: Obstetrics & Gynecology

## 2015-02-10 NOTE — Telephone Encounter (Signed)
Medication refill request: Vanessa Owens  (note- I do not see this on her current med list)  Last AEX:  02-04-15  Next AEX: 06-04-16 Last MMG (if hormonal medication request): 09-25-14 WNL Refill authorized: please advise  Sending to Dr. Quincy Simmonds since Dr. Sabra Heck is on vacation

## 2015-02-19 ENCOUNTER — Ambulatory Visit (INDEPENDENT_AMBULATORY_CARE_PROVIDER_SITE_OTHER): Payer: Managed Care, Other (non HMO) | Admitting: Gastroenterology

## 2015-02-19 ENCOUNTER — Encounter: Payer: Self-pay | Admitting: Gastroenterology

## 2015-02-19 VITALS — BP 138/80 | HR 80 | Ht 61.0 in | Wt 163.0 lb

## 2015-02-19 DIAGNOSIS — K625 Hemorrhage of anus and rectum: Secondary | ICD-10-CM | POA: Insufficient documentation

## 2015-02-19 DIAGNOSIS — K648 Other hemorrhoids: Secondary | ICD-10-CM | POA: Diagnosis not present

## 2015-02-19 DIAGNOSIS — K589 Irritable bowel syndrome without diarrhea: Secondary | ICD-10-CM

## 2015-02-19 MED ORDER — HYDROCORTISONE ACETATE 25 MG RE SUPP: 25.0000 mg | Freq: Two times a day (BID) | RECTAL | Status: DC

## 2015-02-19 MED ORDER — ELUXADOLINE 75 MG PO TABS: 1.0000 | ORAL_TABLET | Freq: Two times a day (BID) | ORAL | Status: DC

## 2015-02-19 NOTE — Assessment & Plan Note (Addendum)
Secondary to internal hemorrhoids, exacerbated by anticoagulation Rx  Plan anusol hc suppositories

## 2015-02-19 NOTE — Assessment & Plan Note (Signed)
Symptomatic with bleeding.  Plan anusol HC supp.

## 2015-02-19 NOTE — Assessment & Plan Note (Signed)
Excellent response to Viberzi though developed some constipation  Plan 1. Change to Viberzi 75mg  bid

## 2015-02-19 NOTE — Progress Notes (Signed)
      History of Present Illness:  Vanessa Owens has returned for followup of diarrhea.  On Viberzi symptoms resolved.  She actually had to stop for a few days because of constipation.  She's had several episodes of BRBPR characterized by blood on the toilet tissue.  She denies rectal pain.  Colonoscopy in 2015 demonstrated a adenomatous polyp.     Review of Systems: Pertinent positive and negative review of systems were noted in the above HPI section. All other review of systems were otherwise negative.    Current Medications, Allergies, Past Medical History, Past Surgical History, Family History and Social History were reviewed in [place] record  Vital signs were reviewed in today's medical record. Physical Exam: General: Well developed , well nourished, no acute distress Anoscopy showed a hemorrhoid in the right posterior position  See Assessment and Plan under Problem List

## 2015-02-19 NOTE — Patient Instructions (Signed)
We are sending in your prescriptions to your pharmacy

## 2015-02-26 ENCOUNTER — Ambulatory Visit (INDEPENDENT_AMBULATORY_CARE_PROVIDER_SITE_OTHER): Payer: Managed Care, Other (non HMO) | Admitting: *Deleted

## 2015-02-26 DIAGNOSIS — Z952 Presence of prosthetic heart valve: Secondary | ICD-10-CM

## 2015-02-26 DIAGNOSIS — Z7901 Long term (current) use of anticoagulants: Secondary | ICD-10-CM

## 2015-02-26 DIAGNOSIS — I359 Nonrheumatic aortic valve disorder, unspecified: Secondary | ICD-10-CM

## 2015-02-26 DIAGNOSIS — Z954 Presence of other heart-valve replacement: Secondary | ICD-10-CM | POA: Diagnosis not present

## 2015-02-26 LAB — POCT INR: INR: 3.1

## 2015-04-09 ENCOUNTER — Ambulatory Visit (INDEPENDENT_AMBULATORY_CARE_PROVIDER_SITE_OTHER): Payer: Managed Care, Other (non HMO) | Admitting: *Deleted

## 2015-04-09 DIAGNOSIS — I359 Nonrheumatic aortic valve disorder, unspecified: Secondary | ICD-10-CM

## 2015-04-09 DIAGNOSIS — Z7901 Long term (current) use of anticoagulants: Secondary | ICD-10-CM

## 2015-04-09 DIAGNOSIS — Z952 Presence of prosthetic heart valve: Secondary | ICD-10-CM

## 2015-04-09 DIAGNOSIS — Z954 Presence of other heart-valve replacement: Secondary | ICD-10-CM | POA: Diagnosis not present

## 2015-04-09 LAB — POCT INR: INR: 4.9

## 2015-04-23 ENCOUNTER — Ambulatory Visit (INDEPENDENT_AMBULATORY_CARE_PROVIDER_SITE_OTHER): Payer: Managed Care, Other (non HMO) | Admitting: *Deleted

## 2015-04-23 DIAGNOSIS — Z954 Presence of other heart-valve replacement: Secondary | ICD-10-CM

## 2015-04-23 DIAGNOSIS — I359 Nonrheumatic aortic valve disorder, unspecified: Secondary | ICD-10-CM | POA: Diagnosis not present

## 2015-04-23 DIAGNOSIS — Z7901 Long term (current) use of anticoagulants: Secondary | ICD-10-CM

## 2015-04-23 DIAGNOSIS — Z952 Presence of prosthetic heart valve: Secondary | ICD-10-CM

## 2015-04-23 LAB — POCT INR: INR: 3.3

## 2015-04-23 MED ORDER — COUMADIN 7.5 MG PO TABS: 7.5000 mg | ORAL_TABLET | ORAL | Status: DC

## 2015-04-24 ENCOUNTER — Encounter: Payer: Self-pay | Admitting: Emergency Medicine

## 2015-04-24 ENCOUNTER — Emergency Department (INDEPENDENT_AMBULATORY_CARE_PROVIDER_SITE_OTHER): Payer: Managed Care, Other (non HMO)

## 2015-04-24 ENCOUNTER — Emergency Department (INDEPENDENT_AMBULATORY_CARE_PROVIDER_SITE_OTHER)
Admission: EM | Admit: 2015-04-24 | Discharge: 2015-04-24 | Disposition: A | Discharge status: Home / Self Care | Payer: Managed Care, Other (non HMO) | Source: Home / Self Care | Attending: Family Medicine | Admitting: Family Medicine

## 2015-04-24 DIAGNOSIS — S80212A Abrasion, left knee, initial encounter: Secondary | ICD-10-CM | POA: Diagnosis not present

## 2015-04-24 DIAGNOSIS — S8992XA Unspecified injury of left lower leg, initial encounter: Secondary | ICD-10-CM | POA: Diagnosis not present

## 2015-04-24 DIAGNOSIS — M25562 Pain in left knee: Secondary | ICD-10-CM

## 2015-04-24 DIAGNOSIS — W1839XA Other fall on same level, initial encounter: Secondary | ICD-10-CM

## 2015-04-24 DIAGNOSIS — W010XXA Fall on same level from slipping, tripping and stumbling without subsequent striking against object, initial encounter: Secondary | ICD-10-CM

## 2015-04-24 MED ORDER — MUPIROCIN CALCIUM 2 % EX CREA: 1.0000 "application " | TOPICAL_CREAM | Freq: Three times a day (TID) | CUTANEOUS | Status: DC

## 2015-04-24 NOTE — ED Notes (Signed)
Left knee injury x 1 week ago worse x 2 days ago, tripped on tree roots and fell onto a tree stump, warm to touch, swollen, painful

## 2015-04-24 NOTE — ED Provider Notes (Signed)
CSN: [id number]     Arrival date & time 04/24/15  1802 History   First MD Initiated Contact with Patient 04/24/15 1808     Chief Complaint  Patient presents with  . Knee Injury   (Consider location/radiation/quality/duration/timing/severity/associated sxs/prior Treatment) HPI Pt is a 54yo female presenting to [facility] with c/o Left knee pain and swelling that started 1 week ago after she tripped and fell onto a tree stump.  Pt notes she scrapped her knee which is now turned into a scab.  Swelling has improved but she is on her feet a lot at one job and walks up stairs.  Standing and walking increase the pain in her knee. Climbing stairs is most painful for pt.  Pain is aching and throbbing, 7/10 at worst. No other injuries. She has been trying ice, rest, and acetaminophen with minimal relief. She also notes swelling in her Left ankle but denies pain in her ankle.  Denies fever or chills.  Pt notes she is on coumadin for a heart valve replacement so she cannot take NSAIDs.   Past Medical History  Diagnosis Date  . Aortic stenosis      by Dr. Prescott Gum  . 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 CHEST PAIN UNSPECIFIED     Qualifier: Diagnosis of  By: Burnett Harry MD, Shanon Brow    . Long term current use of anticoagulant   . Shortness of breath     Qualifier: Diagnosis of  By: Allean Found, LPN, [name]    . Anemia   . Vulvar ulceration     recurrent  . OAB (overactive bladder)   . Osteopenia   . Migraine   . Costochondritis     x2  . Shingles     ? diag/rash 11/21/12  . Rash   . Colon polyp   . Diverticulosis   . Osteoporosis   . IBS (irritable bowel syndrome)    Past Surgical History  Procedure Laterality Date  . Aortic valve replacement  2001    St Jude  . Cesarean section    . Appendectomy      post op abdominal wall hematoma/2 transfusions  . Breast biopsy  5/08    left breast   Family History  Problem Relation Age of Onset  . Hypertension Mother   . Diabetes Mother   .  Hyperlipidemia Mother   . Diabetes Brother   . Hyperlipidemia Brother   . Hypertension Brother   . Diabetes Other   . Diabetes Son   . Alzheimer's disease Maternal Grandfather    Social History  Substance Use Topics  . Smoking status: Never Smoker   . Smokeless tobacco: Never Used  . Alcohol Use: 0.5 - 1.0 oz/week    1-2 Standard drinks or equivalent per week     Comment: occ   OB History    Gravida Para Term Preterm AB TAB SAB Ectopic Multiple Living   2 2        2      Review of Systems  Constitutional: Negative for fever and chills.  Gastrointestinal: Negative for nausea, vomiting and diarrhea.  Musculoskeletal: Positive for myalgias, joint swelling, arthralgias and gait problem.       Left knee  Skin: Positive for wound. Negative for color change.    Allergies  Morphine  Home Medications   Prior to Admission medications   Medication Sig Start Date End Date Taking? Authorizing Provider  amoxicillin (AMOXIL) 500 MG capsule TAKE  4 CAPSULES BY MOUTH I HOUR PRIOR TO APPOINTMENT 01/07/15   Historical Provider, MD  Calcium Carbonate-Vitamin D (CALCIUM-VITAMIN D) 500-200 MG-UNIT per tablet Take 1 tablet by mouth daily. Vitafusion- Take 2 gummies    Historical Provider, MD  COUMADIN 7.5 MG tablet Take 1 tablet (7.5 mg total) by mouth as directed. 04/23/15   Josue Hector, MD  Eluxadoline (VIBERZI) 75 MG TABS Take 1 tablet by mouth 2 (two) times daily. 02/19/15   Inda Castle, MD  FIBER PO Take by mouth. Gummies-daily    Historical Provider, MD  hydrocortisone (ANUSOL-HC) 25 MG suppository Place 1 suppository (25 mg total) rectally every 12 (twelve) hours. 02/19/15   Inda Castle, MD  Multiple Vitamins-Minerals (MULTIVITAMIN GUMMIES ADULTS PO) Take by mouth daily. Pt takes 2 gummies a day    Historical Provider, MD  mupirocin cream (BACTROBAN) 2 % Apply 1 application topically 3 (three) times daily. For 7-10 days 04/24/15   Noland Fordyce, PA-C  omega-3 acid ethyl esters  (LOVAZA) 1 G capsule Take by mouth daily.    Historical Provider, MD  Simethicone (PHAZYME PO) Take 1 tablet by mouth as needed. Pt takes for gas    Historical Provider, MD   Meds Ordered and Administered this Visit  Medications - No data to display  BP 120/72 mmHg  Pulse 74  Temp(Src) 97.8 F (36.6 C) (Oral)  Ht 5\' 1"  (1.549 m)  Wt 165 lb (74.844 kg)  BMI 31.19 kg/m2  SpO2 100%  LMP 05/18/2003 No data found.   Physical Exam  Constitutional: She is oriented to person, place, and time. She appears well-developed and well-nourished.  HENT:  Head: Normocephalic and atraumatic.  Eyes: EOM are normal.  Neck: Normal range of motion.  Cardiovascular: Normal rate.   Pulses:      Dorsalis pedis pulses are 2+ on the left side.  Pulmonary/Chest: Effort normal.  Musculoskeletal: Normal range of motion. She exhibits edema and tenderness.  Left knee: no deformity.  Mild to moderate edema. Mild tenderness over patella and lateral joint space.  Full ROM w/o crepitus. Calf is soft, non-tender.  Left ankle: Mild to moderate edema, full ROM, non-tender.  Neurological: She is alert and oriented to person, place, and time.  Left leg: normal sensation   Skin: Skin is warm and dry.  Left knee: 1cm well healing scab on skin overlying patella. No erythema. Minimal warmth.  No red streaking. No bleeding or discharge.  Psychiatric: She has a normal mood and affect. Her behavior is normal.  Nursing note and vitals reviewed.   ED Course  Procedures (including critical care time)  Labs Review Labs Reviewed - No data to display  Imaging Review Dg Knee Complete 4 Views Left  04/24/2015  CLINICAL DATA:  Pt states she tripped 1 week ago and anterior portion of left knee hit a tree stump. Pain when standing and bending knee. EXAM: LEFT KNEE - COMPLETE 4+ VIEW COMPARISON:  None. FINDINGS: There is no evidence of fracture, dislocation, or joint effusion. There is no evidence of arthropathy or other focal  bone abnormality. Soft tissues are unremarkable. IMPRESSION: No fracture or dislocation. Electronically Signed   By: Suzy Bouchard M.D.   On: 04/24/2015 18:48     MDM   1. Left knee pain   2. Left knee injury, initial encounter   3. Fall from slip, trip, or stumble, initial encounter   4. Abrasion of left knee, initial encounter    Pt c/o Left knee  pain after a trip and fall about 1 week ago. No other injuries. Small abrasions to Left knee. No signs of underlying cellulitis or septic joint at this time.  Plain films: no fracture or dislocation. No evidence of joint effusion. Doubt soft tissue injury ligament or meniscus.  Offered pt crutches and knee sleeve. Pt accepted knee sleeve but declined crutches.  Rx: mupirocin  Pt declined stronger pain medication than acetaminophen. Advised to f/u with Dr. Georgina Snell, Sports Medicine, in 1-2 weeks for recheck of symptoms if not improving, sooner if worsening.  Patient verbalized understanding and agreement with treatment plan.     Noland Fordyce, PA-C 04/24/15 [phone]

## 2015-05-14 ENCOUNTER — Ambulatory Visit (INDEPENDENT_AMBULATORY_CARE_PROVIDER_SITE_OTHER): Payer: Managed Care, Other (non HMO) | Admitting: Pharmacist

## 2015-05-14 DIAGNOSIS — Z7901 Long term (current) use of anticoagulants: Secondary | ICD-10-CM

## 2015-05-14 DIAGNOSIS — I359 Nonrheumatic aortic valve disorder, unspecified: Secondary | ICD-10-CM

## 2015-05-14 DIAGNOSIS — Z952 Presence of prosthetic heart valve: Secondary | ICD-10-CM

## 2015-05-14 DIAGNOSIS — Z954 Presence of other heart-valve replacement: Secondary | ICD-10-CM

## 2015-05-14 LAB — POCT INR: INR: 3.6

## 2015-06-13 ENCOUNTER — Ambulatory Visit (INDEPENDENT_AMBULATORY_CARE_PROVIDER_SITE_OTHER): Payer: Managed Care, Other (non HMO) | Admitting: *Deleted

## 2015-06-13 DIAGNOSIS — Z954 Presence of other heart-valve replacement: Secondary | ICD-10-CM | POA: Diagnosis not present

## 2015-06-13 DIAGNOSIS — I359 Nonrheumatic aortic valve disorder, unspecified: Secondary | ICD-10-CM | POA: Diagnosis not present

## 2015-06-13 DIAGNOSIS — Z952 Presence of prosthetic heart valve: Secondary | ICD-10-CM

## 2015-06-13 DIAGNOSIS — Z7901 Long term (current) use of anticoagulants: Secondary | ICD-10-CM

## 2015-06-13 LAB — POCT INR: INR: 3.9

## 2015-07-07 ENCOUNTER — Ambulatory Visit (INDEPENDENT_AMBULATORY_CARE_PROVIDER_SITE_OTHER): Payer: Managed Care, Other (non HMO) | Admitting: *Deleted

## 2015-07-07 DIAGNOSIS — Z954 Presence of other heart-valve replacement: Secondary | ICD-10-CM

## 2015-07-07 DIAGNOSIS — I359 Nonrheumatic aortic valve disorder, unspecified: Secondary | ICD-10-CM | POA: Diagnosis not present

## 2015-07-07 DIAGNOSIS — Z952 Presence of prosthetic heart valve: Secondary | ICD-10-CM

## 2015-07-07 DIAGNOSIS — Z7901 Long term (current) use of anticoagulants: Secondary | ICD-10-CM | POA: Diagnosis not present

## 2015-07-07 LAB — POCT INR: INR: 2.4

## 2015-07-15 ENCOUNTER — Telehealth: Payer: Self-pay | Admitting: Family Medicine

## 2015-07-15 NOTE — Telephone Encounter (Signed)
LM for pt to call and schedule flu shot or update records. °

## 2015-08-06 ENCOUNTER — Ambulatory Visit (INDEPENDENT_AMBULATORY_CARE_PROVIDER_SITE_OTHER): Payer: Managed Care, Other (non HMO) | Admitting: *Deleted

## 2015-08-06 DIAGNOSIS — Z7901 Long term (current) use of anticoagulants: Secondary | ICD-10-CM | POA: Diagnosis not present

## 2015-08-06 DIAGNOSIS — I359 Nonrheumatic aortic valve disorder, unspecified: Secondary | ICD-10-CM | POA: Diagnosis not present

## 2015-08-06 DIAGNOSIS — Z952 Presence of prosthetic heart valve: Secondary | ICD-10-CM

## 2015-08-06 DIAGNOSIS — Z954 Presence of other heart-valve replacement: Secondary | ICD-10-CM | POA: Diagnosis not present

## 2015-08-06 LAB — POCT INR: INR: 3.3

## 2015-09-05 ENCOUNTER — Ambulatory Visit (INDEPENDENT_AMBULATORY_CARE_PROVIDER_SITE_OTHER): Payer: Managed Care, Other (non HMO) | Admitting: *Deleted

## 2015-09-05 DIAGNOSIS — I359 Nonrheumatic aortic valve disorder, unspecified: Secondary | ICD-10-CM | POA: Diagnosis not present

## 2015-09-05 DIAGNOSIS — Z954 Presence of other heart-valve replacement: Secondary | ICD-10-CM

## 2015-09-05 DIAGNOSIS — Z7901 Long term (current) use of anticoagulants: Secondary | ICD-10-CM

## 2015-09-05 DIAGNOSIS — Z952 Presence of prosthetic heart valve: Secondary | ICD-10-CM

## 2015-09-05 LAB — POCT INR: INR: 3.4

## 2015-09-30 ENCOUNTER — Ambulatory Visit (INDEPENDENT_AMBULATORY_CARE_PROVIDER_SITE_OTHER): Payer: Managed Care, Other (non HMO) | Admitting: *Deleted

## 2015-09-30 DIAGNOSIS — Z954 Presence of other heart-valve replacement: Secondary | ICD-10-CM | POA: Diagnosis not present

## 2015-09-30 DIAGNOSIS — I359 Nonrheumatic aortic valve disorder, unspecified: Secondary | ICD-10-CM | POA: Diagnosis not present

## 2015-09-30 DIAGNOSIS — Z952 Presence of prosthetic heart valve: Secondary | ICD-10-CM

## 2015-09-30 DIAGNOSIS — Z7901 Long term (current) use of anticoagulants: Secondary | ICD-10-CM | POA: Diagnosis not present

## 2015-09-30 LAB — POCT INR: INR: 2.1

## 2015-10-15 ENCOUNTER — Other Ambulatory Visit: Payer: Self-pay | Admitting: Cardiovascular Disease

## 2015-10-15 NOTE — Progress Notes (Signed)
Patient ID: Vanessa Owens, female   DOB: 06-15-1960, 55 y.o.   MRN: [id number]   Vanessa Owens returns today for F/U post AVR in 2001 for bicuspid valve. Previously followed by Dr [name].  Complained about facility fee with last echo. May try to have it at [facility] next year.  Echo reviewed from today and normal EF AVR good with trivial perivalvular regurgitation   Seeing [name] for LLQ pain   Moved into new house Emotional today Working too much at [employer] center and [employer] Has new grand baby in [place] that she misses  ROS: Denies fever, malais, weight loss, blurry vision, decreased visual acuity, cough, sputum, SOB, hemoptysis, pleuritic pain, palpitaitons, heartburn, abdominal pain, melena, lower extremity edema, claudication, or rash.  All other systems reviewed and negative  General: Affect appropriate Healthy:  appears stated age 55: normal Neck supple with no adenopathy JVP normal no bruits no thyromegaly Lungs clear with no wheezing and good diaphragmatic motion Heart:  [id number] click  SEM no AR  murmur, no rub, gallop or click PMI normal Abdomen: benighn, BS positve, no tenderness, no AAA no bruit.  No HSM or HJR Distal pulses intact with no bruits No edema Neuro non-focal Skin warm and dry No muscular weakness   Current Outpatient Prescriptions  Medication Sig Dispense Refill  . amoxicillin (AMOXIL) 500 MG capsule TAKE 4 CAPSULES BY MOUTH I HOUR PRIOR TO APPOINTMENT  3  . Calcium Carbonate-Vitamin D (CALCIUM-VITAMIN D) 500-200 MG-UNIT per tablet Take 1 tablet by mouth daily. Vitafusion- Take 2 gummies    . COUMADIN 7.5 MG tablet TAKE 1 TABLET (7.5 MG TOTAL) BY MOUTH AS DIRECTED. 90 tablet 1  . Eluxadoline (VIBERZI) 75 MG TABS Take 1 tablet by mouth 2 (two) times daily. 60 tablet 5  . FIBER PO Take 1 tablet by mouth daily. Gummies    . Multiple Vitamins-Minerals (MULTIVITAMIN GUMMIES ADULTS PO) Take 2 tablets by mouth daily. GUMMIES    . omega-3 acid ethyl esters  (LOVAZA) 1 G capsule Take 1 g by mouth daily.     . Simethicone (PHAZYME PO) Take 1 tablet by mouth daily as needed (for gas).      No current facility-administered medications for this visit.    Allergies  Morphine  Electrocardiogram: 09/19/12  SR normal   10/16/14  SR rate 71 normal no changes from last year   10/17/15  SR ate 63 normal   Assessment and Plan AVR:  2001 for bicuspid valve trivial  perivalvular regurgitation SBE  F/u echo in a year  LLQ Pain:  F/u [name] on viberzi  ? IBS Anticoagulation no bleeding issues  Coumadin clinic today  Depression:  F/u Dr [name] could use SSRI  [name]

## 2015-10-17 ENCOUNTER — Encounter: Payer: Self-pay | Admitting: Cardiovascular Disease

## 2015-10-17 ENCOUNTER — Ambulatory Visit: Payer: Managed Care, Other (non HMO) | Admitting: Cardiovascular Disease

## 2015-10-17 ENCOUNTER — Other Ambulatory Visit: Payer: Self-pay

## 2015-10-17 ENCOUNTER — Ambulatory Visit (INDEPENDENT_AMBULATORY_CARE_PROVIDER_SITE_OTHER): Payer: Managed Care, Other (non HMO) | Admitting: *Deleted

## 2015-10-17 ENCOUNTER — Other Ambulatory Visit (HOSPITAL_COMMUNITY): Payer: Managed Care, Other (non HMO)

## 2015-10-17 ENCOUNTER — Other Ambulatory Visit: Payer: Self-pay | Admitting: Cardiovascular Disease

## 2015-10-17 ENCOUNTER — Ambulatory Visit (INDEPENDENT_AMBULATORY_CARE_PROVIDER_SITE_OTHER): Payer: Managed Care, Other (non HMO) | Admitting: Cardiovascular Disease

## 2015-10-17 ENCOUNTER — Ambulatory Visit (HOSPITAL_COMMUNITY): Payer: Managed Care, Other (non HMO) | Attending: Cardiology

## 2015-10-17 VITALS — BP 120/94 | HR 63 | Ht 61.0 in | Wt 167.8 lb

## 2015-10-17 DIAGNOSIS — Z7901 Long term (current) use of anticoagulants: Secondary | ICD-10-CM

## 2015-10-17 DIAGNOSIS — Z954 Presence of other heart-valve replacement: Secondary | ICD-10-CM

## 2015-10-17 DIAGNOSIS — I517 Cardiomegaly: Secondary | ICD-10-CM | POA: Insufficient documentation

## 2015-10-17 DIAGNOSIS — Z952 Presence of prosthetic heart valve: Secondary | ICD-10-CM

## 2015-10-17 DIAGNOSIS — E669 Obesity, unspecified: Secondary | ICD-10-CM | POA: Diagnosis not present

## 2015-10-17 DIAGNOSIS — I350 Nonrheumatic aortic (valve) stenosis: Secondary | ICD-10-CM

## 2015-10-17 DIAGNOSIS — I359 Nonrheumatic aortic valve disorder, unspecified: Secondary | ICD-10-CM

## 2015-10-17 DIAGNOSIS — I313 Pericardial effusion (noninflammatory): Secondary | ICD-10-CM | POA: Insufficient documentation

## 2015-10-17 DIAGNOSIS — Z6831 Body mass index (BMI) 31.0-31.9, adult: Secondary | ICD-10-CM | POA: Insufficient documentation

## 2015-10-17 LAB — POCT INR: INR: 4

## 2015-10-17 NOTE — Patient Instructions (Addendum)

## 2015-10-27 ENCOUNTER — Other Ambulatory Visit: Payer: Self-pay | Admitting: Obstetrics & Gynecology

## 2015-10-27 ENCOUNTER — Other Ambulatory Visit: Payer: Self-pay

## 2015-10-27 DIAGNOSIS — Z1231 Encounter for screening mammogram for malignant neoplasm of breast: Secondary | ICD-10-CM

## 2015-10-31 ENCOUNTER — Ambulatory Visit (INDEPENDENT_AMBULATORY_CARE_PROVIDER_SITE_OTHER): Payer: Managed Care, Other (non HMO) | Admitting: *Deleted

## 2015-10-31 DIAGNOSIS — Z954 Presence of other heart-valve replacement: Secondary | ICD-10-CM | POA: Diagnosis not present

## 2015-10-31 DIAGNOSIS — Z7901 Long term (current) use of anticoagulants: Secondary | ICD-10-CM | POA: Diagnosis not present

## 2015-10-31 DIAGNOSIS — I359 Nonrheumatic aortic valve disorder, unspecified: Secondary | ICD-10-CM

## 2015-10-31 DIAGNOSIS — Z952 Presence of prosthetic heart valve: Secondary | ICD-10-CM

## 2015-10-31 LAB — POCT INR: INR: 3.5

## 2015-11-04 ENCOUNTER — Ambulatory Visit
Admission: RE | Admit: 2015-11-04 | Discharge: 2015-11-04 | Disposition: A | Discharge status: Home / Self Care | Payer: Managed Care, Other (non HMO) | Source: Ambulatory Visit | Attending: Obstetrics & Gynecology | Admitting: Obstetrics & Gynecology

## 2015-11-04 DIAGNOSIS — Z1231 Encounter for screening mammogram for malignant neoplasm of breast: Secondary | ICD-10-CM

## 2015-11-21 ENCOUNTER — Ambulatory Visit (INDEPENDENT_AMBULATORY_CARE_PROVIDER_SITE_OTHER): Payer: Managed Care, Other (non HMO) | Admitting: *Deleted

## 2015-11-21 DIAGNOSIS — Z954 Presence of other heart-valve replacement: Secondary | ICD-10-CM

## 2015-11-21 DIAGNOSIS — Z7901 Long term (current) use of anticoagulants: Secondary | ICD-10-CM | POA: Diagnosis not present

## 2015-11-21 DIAGNOSIS — I359 Nonrheumatic aortic valve disorder, unspecified: Secondary | ICD-10-CM

## 2015-11-21 DIAGNOSIS — Z952 Presence of prosthetic heart valve: Secondary | ICD-10-CM

## 2015-11-21 LAB — POCT INR: INR: 4.1

## 2015-12-02 ENCOUNTER — Ambulatory Visit (INDEPENDENT_AMBULATORY_CARE_PROVIDER_SITE_OTHER): Payer: Managed Care, Other (non HMO) | Admitting: *Deleted

## 2015-12-02 DIAGNOSIS — I359 Nonrheumatic aortic valve disorder, unspecified: Secondary | ICD-10-CM | POA: Diagnosis not present

## 2015-12-02 DIAGNOSIS — Z954 Presence of other heart-valve replacement: Secondary | ICD-10-CM

## 2015-12-02 DIAGNOSIS — Z952 Presence of prosthetic heart valve: Secondary | ICD-10-CM

## 2015-12-02 DIAGNOSIS — Z7901 Long term (current) use of anticoagulants: Secondary | ICD-10-CM | POA: Diagnosis not present

## 2015-12-02 LAB — POCT INR: INR: 2.6

## 2015-12-30 ENCOUNTER — Ambulatory Visit (INDEPENDENT_AMBULATORY_CARE_PROVIDER_SITE_OTHER): Payer: Managed Care, Other (non HMO) | Admitting: *Deleted

## 2015-12-30 DIAGNOSIS — Z7901 Long term (current) use of anticoagulants: Secondary | ICD-10-CM

## 2015-12-30 DIAGNOSIS — I359 Nonrheumatic aortic valve disorder, unspecified: Secondary | ICD-10-CM

## 2015-12-30 DIAGNOSIS — Z952 Presence of prosthetic heart valve: Secondary | ICD-10-CM

## 2015-12-30 DIAGNOSIS — Z954 Presence of other heart-valve replacement: Secondary | ICD-10-CM

## 2015-12-30 LAB — POCT INR: INR: 2.9

## 2016-01-27 ENCOUNTER — Ambulatory Visit (INDEPENDENT_AMBULATORY_CARE_PROVIDER_SITE_OTHER): Payer: Managed Care, Other (non HMO) | Admitting: *Deleted

## 2016-01-27 DIAGNOSIS — I359 Nonrheumatic aortic valve disorder, unspecified: Secondary | ICD-10-CM

## 2016-01-27 DIAGNOSIS — Z7901 Long term (current) use of anticoagulants: Secondary | ICD-10-CM | POA: Diagnosis not present

## 2016-01-27 DIAGNOSIS — Z954 Presence of other heart-valve replacement: Secondary | ICD-10-CM

## 2016-01-27 DIAGNOSIS — Z952 Presence of prosthetic heart valve: Secondary | ICD-10-CM

## 2016-01-27 LAB — POCT INR: INR: 3.7

## 2016-02-25 ENCOUNTER — Ambulatory Visit (INDEPENDENT_AMBULATORY_CARE_PROVIDER_SITE_OTHER): Payer: Managed Care, Other (non HMO) | Admitting: *Deleted

## 2016-02-25 DIAGNOSIS — Z952 Presence of prosthetic heart valve: Secondary | ICD-10-CM

## 2016-02-25 DIAGNOSIS — I359 Nonrheumatic aortic valve disorder, unspecified: Secondary | ICD-10-CM

## 2016-02-25 DIAGNOSIS — Z7901 Long term (current) use of anticoagulants: Secondary | ICD-10-CM

## 2016-02-25 LAB — POCT INR: INR: 3.5

## 2016-03-31 ENCOUNTER — Ambulatory Visit (INDEPENDENT_AMBULATORY_CARE_PROVIDER_SITE_OTHER): Payer: Managed Care, Other (non HMO) | Admitting: *Deleted

## 2016-03-31 DIAGNOSIS — Z952 Presence of prosthetic heart valve: Secondary | ICD-10-CM

## 2016-03-31 DIAGNOSIS — Z7901 Long term (current) use of anticoagulants: Secondary | ICD-10-CM

## 2016-03-31 DIAGNOSIS — I359 Nonrheumatic aortic valve disorder, unspecified: Secondary | ICD-10-CM

## 2016-03-31 LAB — POCT INR: INR: 2.3

## 2016-04-12 ENCOUNTER — Other Ambulatory Visit: Payer: Self-pay | Admitting: Cardiovascular Disease

## 2016-05-05 ENCOUNTER — Ambulatory Visit (INDEPENDENT_AMBULATORY_CARE_PROVIDER_SITE_OTHER): Payer: Managed Care, Other (non HMO) | Admitting: *Deleted

## 2016-05-05 DIAGNOSIS — Z7901 Long term (current) use of anticoagulants: Secondary | ICD-10-CM | POA: Diagnosis not present

## 2016-05-05 DIAGNOSIS — Z952 Presence of prosthetic heart valve: Secondary | ICD-10-CM

## 2016-05-05 DIAGNOSIS — I359 Nonrheumatic aortic valve disorder, unspecified: Secondary | ICD-10-CM | POA: Diagnosis not present

## 2016-05-05 LAB — POCT INR: INR: 3.8

## 2016-05-31 ENCOUNTER — Ambulatory Visit (INDEPENDENT_AMBULATORY_CARE_PROVIDER_SITE_OTHER): Payer: Managed Care, Other (non HMO) | Admitting: Pharmacist

## 2016-05-31 DIAGNOSIS — Z7901 Long term (current) use of anticoagulants: Secondary | ICD-10-CM

## 2016-05-31 LAB — POCT INR: INR: 2

## 2016-06-04 ENCOUNTER — Ambulatory Visit: Payer: Managed Care, Other (non HMO) | Admitting: Obstetrics & Gynecology

## 2016-06-11 NOTE — Progress Notes (Deleted)
Patient ID: Vanessa Owens, female   DOB: 14-Mar-1961, 56 y.o.   MRN: [id number]   Vanessa Owens returns today for F/U post AVR in 2001 for bicuspid valve. Previously followed by Vanessa Vanessa Owens.  Complained about facility fee with last echo. May try to have it at [facility] next year.  Echo reviewed from 10/17/15  and normal EF AVR good with trivial perivalvular regurgitation   Seeing Vanessa Owens for LLQ pain   New house in 2017  Working too much at [employer] center and [employer] Has  grand baby in [place] that she misses  ROS: Denies fever, malais, weight loss, blurry vision, decreased visual acuity, cough, sputum, SOB, hemoptysis, pleuritic pain, palpitaitons, heartburn, abdominal pain, melena, lower extremity edema, claudication, or rash.  All other systems reviewed and negative  General: Affect appropriate Healthy:  appears stated age 56: normal Neck supple with no adenopathy JVP normal no bruits no thyromegaly Lungs clear with no wheezing and good diaphragmatic motion Heart:  99991111 click  SEM no AR  murmur, no rub, gallop or click PMI normal Abdomen: benighn, BS positve, no tenderness, no AAA no bruit.  No HSM or HJR Distal pulses intact with no bruits No edema Neuro non-focal Skin warm and dry No muscular weakness   Current Outpatient Prescriptions  Medication Sig Dispense Refill  . amoxicillin (AMOXIL) 500 MG capsule TAKE 4 CAPSULES BY MOUTH I HOUR PRIOR TO APPOINTMENT  3  . Calcium-Magnesium-Vitamin D (CITRACAL SLOW RELEASE PO) Take 2 capsules by mouth daily. 1200mg s + D3, Slow Release    . COUMADIN 7.5 MG tablet TAKE 1 TABLET (7.5 MG TOTAL) BY MOUTH AS DIRECTED. 90 tablet 1  . Eluxadoline (VIBERZI) 75 MG TABS Take 1 tablet by mouth 2 (two) times daily. 60 tablet 5  . FIBER PO Take 1 tablet by mouth daily. Gummies    . Multiple Vitamins-Minerals (MULTIVITAMIN GUMMIES ADULTS PO) Take 2 tablets by mouth daily. GUMMIES    . omega-3 acid ethyl esters (LOVAZA) 1 G capsule Take 1 g by mouth  daily.     . Simethicone (PHAZYME PO) Take 1 tablet by mouth daily as needed (for gas).      No current facility-administered medications for this visit.     Allergies  Morphine  Electrocardiogram: 09/19/12  SR normal   10/16/14  SR rate 71 normal no changes from last year   10/17/15  SR ate 63 normal   Assessment and Plan AVR:  2001 for bicuspid valve trivial  perivalvular regurgitation SBE  F/u echo 10/2017 if no issues  LLQ Pain:  F/u Vanessa Owens on viberzi  ? IBS Anticoagulation no bleeding issues  Coumadin clinic today  Depression:  F/u Vanessa Owens could use SSRI  Vanessa Owens

## 2016-06-17 ENCOUNTER — Encounter: Payer: Self-pay | Admitting: *Deleted

## 2016-06-23 ENCOUNTER — Ambulatory Visit: Payer: Managed Care, Other (non HMO) | Admitting: Cardiovascular Disease

## 2016-06-24 ENCOUNTER — Telehealth: Payer: Self-pay | Admitting: Obstetrics & Gynecology

## 2016-06-24 NOTE — Telephone Encounter (Signed)
Left patient a message to call back to reschedule a future appointment that was cancelled by the provider for an AEX. °

## 2016-06-28 ENCOUNTER — Ambulatory Visit (INDEPENDENT_AMBULATORY_CARE_PROVIDER_SITE_OTHER): Payer: Managed Care, Other (non HMO)

## 2016-06-28 DIAGNOSIS — Z7901 Long term (current) use of anticoagulants: Secondary | ICD-10-CM

## 2016-06-28 LAB — POCT INR: INR: 3.8

## 2016-07-02 ENCOUNTER — Ambulatory Visit: Payer: Managed Care, Other (non HMO) | Admitting: Obstetrics & Gynecology

## 2016-07-04 NOTE — Progress Notes (Signed)
Patient ID: Vanessa Owens, female   DOB: 1960-08-25, 56 y.o.   MRN: [id number]   Vanessa Owens returns today for F/U post AVR in 2001 for bicuspid valve. Previously followed by Dr [name].  Complained about facility fee with last echo. May try to have it at [facility]  In future  Echo reviewed from 10/17/15  and normal EF AVR good with trivial perivalvular regurgitation   Seeing [name] for LLQ pain   New house in 2017  Working too much at [employer] center and [employer] Has  grand baby in [place] that she misses  Some exertional dyspnea and pressure in chest Not all time but exertional lasts for minutes Then resolves. Going on for a few months   ROS: Denies fever, malais, weight loss, blurry vision, decreased visual acuity, cough, sputum, SOB, hemoptysis, pleuritic pain, palpitaitons, heartburn, abdominal pain, melena, lower extremity edema, claudication, or rash.  All other systems reviewed and negative  General: Affect appropriate Healthy:  appears stated age 56: normal Neck supple with no adenopathy JVP normal no bruits no thyromegaly Lungs clear with no wheezing and good diaphragmatic motion Heart:  [id number] click  SEM no AR  murmur, no rub, gallop or click PMI normal Abdomen: benighn, BS positve, no tenderness, no AAA no bruit.  No HSM or HJR Distal pulses intact with no bruits No edema Neuro non-focal Skin warm and dry No muscular weakness   Current Outpatient Prescriptions  Medication Sig Dispense Refill  . Calcium-Magnesium-Vitamin D (CITRACAL SLOW RELEASE PO) Take 2 capsules by mouth daily. 1200mg s + D3, Slow Release    . COUMADIN 7.5 MG tablet TAKE 1 TABLET (7.5 MG TOTAL) BY MOUTH AS DIRECTED. 90 tablet 1  . cyclobenzaprine (FLEXERIL) 10 MG tablet Take one tab by mouth at bedtime. 15 tablet 0  . Eluxadoline (VIBERZI) 75 MG TABS Take 1 tablet by mouth 2 (two) times daily. 60 tablet 5  . FIBER PO Take 1 tablet by mouth daily. Gummies    . Multiple Vitamins-Minerals  (MULTIVITAMIN GUMMIES ADULTS PO) Take 2 tablets by mouth daily. GUMMIES    . omega-3 acid ethyl esters (LOVAZA) 1 G capsule Take 1 g by mouth daily.     . predniSONE (DELTASONE) 20 MG tablet Take one tab by mouth twice daily for 5 days, then one daily. Take with food. 14 tablet 0  . Simethicone (PHAZYME PO) Take 1 tablet by mouth daily as needed (for gas).      No current facility-administered medications for this visit.     Allergies  Morphine and Morphine and related  Electrocardiogram: 09/19/12  SR normal   10/16/14  SR rate 71 normal no changes from last year   10/17/15  SR ate 63 normal   Assessment and Plan  AVR:  2001 for bicuspid valve trivial  perivalvular regurgitation last echo 10/17/15  SBE   LLQ Pain:  F/u [name] on viberzi  ? IBS Anticoagulation no bleeding issues  Coumadin clinic 06/28/16 Rx  Depression:  F/u Dr [name] could use SSRI Chest Pain:  No CAD at time of AVR normal ECG f/u ETT  [facility]

## 2016-07-07 ENCOUNTER — Encounter: Payer: Self-pay | Admitting: *Deleted

## 2016-07-07 ENCOUNTER — Emergency Department (INDEPENDENT_AMBULATORY_CARE_PROVIDER_SITE_OTHER)
Admission: EM | Admit: 2016-07-07 | Discharge: 2016-07-07 | Disposition: A | Discharge status: Home / Self Care | Payer: Managed Care, Other (non HMO) | Source: Home / Self Care | Attending: Family Medicine | Admitting: Family Medicine

## 2016-07-07 DIAGNOSIS — M545 Low back pain, unspecified: Secondary | ICD-10-CM

## 2016-07-07 MED ORDER — CYCLOBENZAPRINE HCL 10 MG PO TABS: ORAL_TABLET | ORAL | 0 refills | Status: DC

## 2016-07-07 MED ORDER — PREDNISONE 20 MG PO TABS: ORAL_TABLET | ORAL | 0 refills | Status: DC

## 2016-07-07 NOTE — ED Triage Notes (Signed)
Pt c/o LT sided LBP x 1 day. Hx of sciatica. She took tylenol yesterday and applied heat and ice.

## 2016-07-07 NOTE — Discharge Instructions (Signed)
Apply ice pack for 20 to 30 minutes, 3 to 4 times daily  Continue until pain and swelling decrease.  Begin range of motion and stretching exercises as tolerated. 

## 2016-07-07 NOTE — ED Provider Notes (Signed)
[name]    CSN: [id number] Arrival date & time: 07/07/16  1325     History   Chief Complaint Chief Complaint  Patient presents with  . Back Pain    HPI Vanessa Owens is a 56 y.o. female.   Patient awoke yesterday with non-radiating left lower back pain.  She has been applying a heating pad and the pain became worse last night.  The pain today is intermittent and sharp, worse when bending over.   She denies bowel or bladder dysfunction, and no saddle numbness.  She recalls no injury or change in activities.  She has a past history of sciatic pain.   The history is provided by the patient.  Back Pain  Location:  Lumbar spine Quality:  Aching Radiates to:  Does not radiate Pain severity:  Moderate Pain is:  Same all the time Onset quality:  Sudden Duration:  1 day Timing:  Constant Progression:  Unchanged Chronicity:  New Context: not falling, not lifting heavy objects, not MVA, not physical stress, not recent illness, not recent injury and not twisting   Relieved by:  Nothing Worsened by:  Movement Ineffective treatments: tylenol. Associated symptoms: no abdominal pain, no bladder incontinence, no bowel incontinence, no chest pain, no dysuria, no fever, no headaches, no leg pain, no numbness, no paresthesias, no pelvic pain, no perianal numbness, no tingling, no weakness and no weight loss     Past Medical History:  Diagnosis Date  . Anemia   . Aortic stenosis     by Dr. Prescott Gum  . CHEST PAIN UNSPECIFIED    Qualifier: Diagnosis of  By: Burnett Harry MD, Shanon Brow    . Colon polyp   . Costochondritis    x2  . Diverticulosis   . IBS (irritable bowel syndrome)   . Long term current use of anticoagulant   . Migraine   . OAB (overactive bladder)   . Osteopenia   . Osteoporosis   . Rash   . S/P AVR    2001 for bicuspid AV.....Marland KitchenST JUDE  . Shingles    ? diag/rash 11/21/12  . Shortness of breath    Qualifier: Diagnosis of  By: Allean Found, LPN, [name]    .  Vulvar ulceration    recurrent    Patient Active Problem List   Diagnosis Date Noted  . Internal hemorrhoid 02/19/2015  . Rectal bleeding 02/19/2015  . Osteoporosis 11/28/2014  . Abdominal pain, left lower quadrant 10/07/2014  . IBS (irritable bowel syndrome) 10/07/2014  . Diverticulitis of colon 03/19/2014  . Special screening for malignant neoplasms, colon 10/19/2013  . Chronic anticoagulation 10/19/2013  . H/O aortic valve replacement 12/26/2012  . CHEST PAIN UNSPECIFIED 07/31/2010  . Long term current use of anticoagulant 06/29/2010  . SHORTNESS OF BREATH 10/03/2008  . AORTIC VALVE DISORDERS 03/31/2008  . AORTIC VALVE REPLACEMENT, HX OF 03/31/2008  . COSTOCHONDRITIS 05/01/2007    Past Surgical History:  Procedure Laterality Date  . AORTIC VALVE REPLACEMENT  2001   [facility] [employer]  . APPENDECTOMY     post op abdominal wall hematoma/2 transfusions  . BREAST BIOPSY  5/08   left breast  . CESAREAN SECTION      OB History    Gravida Para Term Preterm AB Living   2 2       2    SAB TAB Ectopic Multiple Live Births                   Home Medications  Prior to Admission medications   Medication Sig Start Date End Date Taking? Authorizing Provider  Calcium-Magnesium-Vitamin D (CITRACAL SLOW RELEASE PO) Take 2 capsules by mouth daily. 1200mg s + D3, Slow Release   Yes Historical Provider, MD  COUMADIN 7.5 MG tablet TAKE 1 TABLET (7.5 MG TOTAL) BY MOUTH AS DIRECTED. 04/12/16  Yes Josue Hector, MD  cyclobenzaprine (FLEXERIL) 10 MG tablet Take one tab by mouth at bedtime. 07/07/16   Kandra Nicolas, MD  Eluxadoline (VIBERZI) 75 MG TABS Take 1 tablet by mouth 2 (two) times daily. 02/19/15   Inda Castle, MD  FIBER PO Take 1 tablet by mouth daily. Gummies    Historical Provider, MD  Multiple Vitamins-Minerals (MULTIVITAMIN GUMMIES ADULTS PO) Take 2 tablets by mouth daily. [place] Provider, MD  omega-3 acid ethyl esters (LOVAZA) 1 G capsule Take 1 g by mouth daily.      Historical Provider, MD  predniSONE (DELTASONE) 20 MG tablet Take one tab by mouth twice daily for 5 days, then one daily. Take with food. 07/07/16   Kandra Nicolas, MD  Simethicone (PHAZYME PO) Take 1 tablet by mouth daily as needed (for gas).     Historical Provider, MD    Family History Family History  Problem Relation Age of Onset  . Hypertension Mother   . Diabetes Mother   . Hyperlipidemia Mother   . Diabetes Brother   . Hyperlipidemia Brother   . Hypertension Brother   . Diabetes Son   . Alzheimer's disease Maternal Grandfather   . Diabetes Maternal Grandfather     Social History Social History  Substance Use Topics  . Smoking status: Never Smoker  . Smokeless tobacco: Never Used  . Alcohol use 0.5 - 1.0 oz/week    1 - 2 Standard drinks or equivalent per week     Comment: occ     Allergies   Morphine   Review of Systems Review of Systems  Constitutional: Negative for fever and weight loss.  Cardiovascular: Negative for chest pain.  Gastrointestinal: Negative for abdominal pain and bowel incontinence.  Genitourinary: Negative for bladder incontinence, dysuria and pelvic pain.  Musculoskeletal: Positive for back pain.  Neurological: Negative for tingling, weakness, numbness, headaches and paresthesias.  All other systems reviewed and are negative.    Physical Exam Triage Vital Signs ED Triage Vitals  Enc Vitals Group     BP 07/07/16 1357 141/87     Pulse Rate 07/07/16 1357 74     Resp 07/07/16 1357 16     Temp 07/07/16 1357 98 F (36.7 C)     Temp Source 07/07/16 1357 Oral     SpO2 07/07/16 1357 99 %     Weight 07/07/16 1358 161 lb (73 kg)     Height 07/07/16 1358 5\' 1"  (1.549 m)     Head Circumference --      Peak Flow --      Pain Score 07/07/16 1359 7     Pain Loc --      Pain Edu? --      Excl. in Page? --    No data found.   Updated Vital Signs BP 141/87 (BP Location: Left Arm)   Pulse 74   Temp 98 F (36.7 C) (Oral)   Resp 16   Ht  5\' 1"  (1.549 m)   Wt 161 lb (73 kg)   LMP 05/18/2003   SpO2 99%   BMI 30.42 kg/m   Visual Acuity  Right Eye Distance:   Left Eye Distance:   Bilateral Distance:    Right Eye Near:   Left Eye Near:    Bilateral Near:     Physical Exam  Constitutional: She appears well-developed and well-nourished. No distress.  HENT:  Head: Normocephalic.  Right Ear: External ear normal.  Left Ear: External ear normal.  Nose: Nose normal.  Mouth/Throat: Oropharynx is clear and moist.  Eyes: Conjunctivae are normal. Pupils are equal, round, and reactive to light.  Neck: Normal range of motion.  Cardiovascular: Normal heart sounds.   Pulmonary/Chest: Breath sounds normal.  Abdominal: There is no tenderness.  Musculoskeletal: She exhibits no edema.       Lumbar back: She exhibits tenderness. She exhibits no bony tenderness and no swelling.       Back:  Back:  Can heel/toe walk and squat without difficulty.  Decreased forward flexion.  Tenderness in the midline and left paraspinous muscles from L4 to Sacral area.  Straight leg raising test is negative.  Sitting knee extension test is negative.  Strength and sensation in the lower extremities is normal.  Patellar and achilles reflexes are normal.  FABER negative         Neurological: She is alert.  Skin: Skin is warm and dry.  Nursing note and vitals reviewed.    UC Treatments / Results  Labs (all labs ordered are listed, but only abnormal results are displayed) Labs Reviewed - No data to display  EKG  EKG Interpretation None       Radiology No results found.  Procedures Procedures (including critical care time)  Medications Ordered in UC Medications - No data to display   Initial Impression / Assessment and Plan / UC Course  I have reviewed the triage vital signs and the nursing notes.  Pertinent labs & imaging results that were available during my care of the patient were reviewed by me and considered in my medical  decision making (see chart for details).    Begin prednisone burst/taper, and Flexeril at bedtime. Apply ice pack for 20 to 30 minutes, 3 to 4 times daily  Continue until pain and swelling decrease. Begin range of motion and stretching exercises as tolerated. Followup with Dr. Aundria Mems or Dr. Lynne Leader ([facility]) if not improving about two weeks.     Final Clinical Impressions(s) / UC Diagnoses   Final diagnoses:  Acute left-sided low back pain without sciatica    New Prescriptions New Prescriptions   CYCLOBENZAPRINE (FLEXERIL) 10 MG TABLET    Take one tab by mouth at bedtime.   PREDNISONE (DELTASONE) 20 MG TABLET    Take one tab by mouth twice daily for 5 days, then one daily. Take with food.     Kandra Nicolas, MD 07/08/16 [phone]

## 2016-07-15 ENCOUNTER — Ambulatory Visit (INDEPENDENT_AMBULATORY_CARE_PROVIDER_SITE_OTHER): Payer: Managed Care, Other (non HMO) | Admitting: *Deleted

## 2016-07-15 ENCOUNTER — Encounter: Payer: Self-pay | Admitting: Cardiovascular Disease

## 2016-07-15 ENCOUNTER — Ambulatory Visit (INDEPENDENT_AMBULATORY_CARE_PROVIDER_SITE_OTHER): Payer: Managed Care, Other (non HMO) | Admitting: Cardiovascular Disease

## 2016-07-15 VITALS — BP 118/68 | HR 82 | Ht 61.0 in | Wt 161.0 lb

## 2016-07-15 DIAGNOSIS — Z7901 Long term (current) use of anticoagulants: Secondary | ICD-10-CM

## 2016-07-15 DIAGNOSIS — Z952 Presence of prosthetic heart valve: Secondary | ICD-10-CM

## 2016-07-15 DIAGNOSIS — R079 Chest pain, unspecified: Secondary | ICD-10-CM

## 2016-07-15 LAB — POCT INR: INR: 3.4

## 2016-07-15 NOTE — Progress Notes (Signed)
56 y.o. G2P2 Single Caucasian F here for annual exam.  Doing well.  Just saw Dr. Johnsie Cancel for a routine visit.  Going to have a stress test 3/14.  Denies vaginal bleeding. Paxtang was 64 2015.  Off micronor.  No bleeding.  No hot flashes.  Does have some vaginal dryness.    Patient's last menstrual period was 05/18/2003.          Sexually active: Yes.    The current method of family planning is post menopausal status.    Exercising: No.  The patient does not participate in regular exercise at present. Smoker:  no  Health Maintenance: Pap:  01/08/14 negative  History of abnormal Pap:  yes MMG:  11/04/15 BIRADS 1 negative  Colonoscopy:  2015- repeat 5 years  BMD:   09/25/14 osteoporosis  TDaP:  10/30/09  Pneumonia vaccine(s):  never  Zostavax:   Would like to get this Hep C testing: discuss today Screening Labs: discuss today   reports that she has never smoked. She has never used smokeless tobacco. She reports that she drinks about 0.5 - 1.0 oz of alcohol per week . She reports that she does not use drugs.  Past Medical History:  Diagnosis Date  . Anemia   . Aortic stenosis     by Dr. Prescott Gum  . CHEST PAIN UNSPECIFIED    Qualifier: Diagnosis of  By: Burnett Harry MD, Shanon Brow    . Colon polyp   . Costochondritis    x2  . Diverticulosis   . IBS (irritable bowel syndrome)   . Long term current use of anticoagulant   . Migraine   . OAB (overactive bladder)   . Osteopenia   . Osteoporosis   . Rash   . S/P AVR    2001 for bicuspid AV.....Marland KitchenST JUDE  . Shingles    ? diag/rash 11/21/12  . Shortness of breath    Qualifier: Diagnosis of  By: Allean Found, LPN, [name]    . Vulvar ulceration    recurrent    Past Surgical History:  Procedure Laterality Date  . AORTIC VALVE REPLACEMENT  2001   [facility]  . APPENDECTOMY     post op abdominal wall hematoma/2 transfusions  . BREAST BIOPSY  5/08   left breast  . CESAREAN SECTION      Current Outpatient Prescriptions  Medication Sig Dispense Refill  .  Calcium-Magnesium-Vitamin D (CITRACAL SLOW RELEASE PO) Take 2 capsules by mouth daily. 1200mg s + D3, Slow Release    . COUMADIN 7.5 MG tablet TAKE 1 TABLET (7.5 MG TOTAL) BY MOUTH AS DIRECTED. 90 tablet 1  . FIBER PO Take 1 tablet by mouth daily. Gummies    . Multiple Vitamins-Minerals (MULTIVITAMIN GUMMIES ADULTS PO) Take 2 tablets by mouth daily. GUMMIES    . omega-3 acid ethyl esters (LOVAZA) 1 G capsule Take 1 g by mouth daily.      No current facility-administered medications for this visit.     Family History  Problem Relation Age of Onset  . Hypertension Mother   . Diabetes Mother   . Hyperlipidemia Mother   . Diabetes Brother   . Hyperlipidemia Brother   . Hypertension Brother   . Diabetes Son   . Alzheimer's disease Maternal Grandfather   . Diabetes Maternal Grandfather     ROS:  Pertinent items are noted in HPI.  Otherwise, a comprehensive ROS was negative.  Exam:   BP 128/70 (BP Location: Right Arm, Patient Position: Sitting, Cuff Size: Normal)  Pulse 68   Resp 16   Ht 5' (1.524 m)   Wt 161 lb (73 kg)   LMP 05/18/2003   BMI 31.44 kg/m     Height: 5' (152.4 cm)  Ht Readings from Last 3 Encounters:  07/19/16 5' (1.524 m)  07/15/16 5\' 1"  (1.549 m)  07/07/16 5\' 1"  (1.549 m)    General appearance: alert, cooperative and appears stated age Head: Normocephalic, without obvious abnormality, atraumatic Neck: no adenopathy, supple, symmetrical, trachea midline and thyroid normal to inspection and palpation Lungs: clear to auscultation bilaterally Breasts: normal appearance, no masses or tenderness Heart: regular rate and rhythm, 3/6 aortic murmur Abdomen: soft, non-tender; bowel sounds normal; no masses,  no organomegaly Extremities: extremities normal, atraumatic, no cyanosis or edema Skin: Skin color, texture, turgor normal. No rashes or lesions Lymph nodes: Cervical, supraclavicular, and axillary nodes normal. No abnormal inguinal nodes palpated Neurologic:  Grossly normal   Pelvic: External genitalia:  no lesions              Urethra:  normal appearing urethra with no masses, tenderness or lesions              Bartholins and Skenes: normal                 Vagina: normal appearing vagina with normal color and discharge, no lesions              Cervix: no lesions              Pap taken: Yes.   Bimanual Exam:  Uterus:  normal size, contour, position, consistency, mobility, non-tender              Adnexa: normal adnexa and no mass, fullness, tenderness               Rectovaginal: Confirms               Anus:  normal sphincter tone, no lesions  Chaperone was present for exam.  A:  Well Woman with normal exam PMP, no HRT H/O Aortic valve replacement.  Having stress test on 3/14 Osteoporosis.  Has declined treatment  Plan BMD with next visit. OAB IBS with diarrhea H/O internal hemorrhoids Diverticulosis  P:   Mammogram guidelines reviewed pap smear with HR HPV obtained today Order for shingrx vaccine given to pt. Pt does need Hep C testing.  Declines having blood work done today.  Will try to do this next year.  Also encouraged pt to establish care with PCP return annually or prn

## 2016-07-15 NOTE — Patient Instructions (Signed)
Medication Instructions:  Your physician recommends that you continue on your current medications as directed. Please refer to the Current Medication list given to you today.   Labwork: none  Testing/Procedures: Your physician has requested that you have an exercise tolerance test. For further information please visit [email or web address]. Please also follow instruction sheet, as given.    Follow-Up: Your physician wants you to follow-up in: 12 months.  You will receive a reminder letter in the mail two months in advance. If you don't receive a letter, please call our office to schedule the follow-up appointment.   Any Other Special Instructions Will Be Listed Below (If Applicable).     If you need a refill on your cardiac medications before your next appointment, please call your pharmacy.

## 2016-07-19 ENCOUNTER — Other Ambulatory Visit (HOSPITAL_COMMUNITY)
Admission: RE | Admit: 2016-07-19 | Discharge: 2016-07-19 | Disposition: A | Discharge status: Home / Self Care | Payer: Managed Care, Other (non HMO) | Source: Ambulatory Visit | Attending: Obstetrics & Gynecology | Admitting: Obstetrics & Gynecology

## 2016-07-19 ENCOUNTER — Ambulatory Visit (INDEPENDENT_AMBULATORY_CARE_PROVIDER_SITE_OTHER): Payer: Managed Care, Other (non HMO) | Admitting: Obstetrics & Gynecology

## 2016-07-19 ENCOUNTER — Encounter: Payer: Self-pay | Admitting: Obstetrics & Gynecology

## 2016-07-19 VITALS — BP 128/70 | HR 68 | Resp 16 | Ht 60.0 in | Wt 161.0 lb

## 2016-07-19 DIAGNOSIS — Z1151 Encounter for screening for human papillomavirus (HPV): Secondary | ICD-10-CM | POA: Diagnosis not present

## 2016-07-19 DIAGNOSIS — Z124 Encounter for screening for malignant neoplasm of cervix: Secondary | ICD-10-CM

## 2016-07-19 DIAGNOSIS — Z01419 Encounter for gynecological examination (general) (routine) without abnormal findings: Secondary | ICD-10-CM | POA: Insufficient documentation

## 2016-07-21 LAB — CYTOLOGY - PAP
DIAGNOSIS: NEGATIVE
HPV: NOT DETECTED

## 2016-07-28 ENCOUNTER — Ambulatory Visit (INDEPENDENT_AMBULATORY_CARE_PROVIDER_SITE_OTHER): Payer: Managed Care, Other (non HMO)

## 2016-07-28 DIAGNOSIS — R079 Chest pain, unspecified: Secondary | ICD-10-CM

## 2016-07-28 LAB — EXERCISE TOLERANCE TEST
CHL RATE OF PERCEIVED EXERTION: 17
CSEPED: 9 min
CSEPEW: 10.1 METS
Exercise duration (sec): 30 s
MPHR: 166 {beats}/min
Peak HR: 142 {beats}/min
Percent HR: 85 %
Rest HR: 68 {beats}/min

## 2016-07-29 ENCOUNTER — Telehealth: Payer: Self-pay | Admitting: Cardiovascular Disease

## 2016-07-29 NOTE — Telephone Encounter (Signed)
Left message for patient to call back. Called patient about GXT results. Per Dr. Johnsie Cancel, normal exercise tolerance test. Sent results through [place].

## 2016-07-29 NOTE — Telephone Encounter (Signed)
Patient aware of GXT result.

## 2016-07-29 NOTE — Telephone Encounter (Signed)
Follow Up   Pt returning phone call from nurse. Requesting call back

## 2016-08-13 ENCOUNTER — Ambulatory Visit (INDEPENDENT_AMBULATORY_CARE_PROVIDER_SITE_OTHER): Payer: Managed Care, Other (non HMO) | Admitting: *Deleted

## 2016-08-13 DIAGNOSIS — Z7901 Long term (current) use of anticoagulants: Secondary | ICD-10-CM | POA: Diagnosis not present

## 2016-08-13 LAB — POCT INR: INR: 3.3

## 2016-09-24 ENCOUNTER — Ambulatory Visit (INDEPENDENT_AMBULATORY_CARE_PROVIDER_SITE_OTHER): Payer: Managed Care, Other (non HMO)

## 2016-09-24 DIAGNOSIS — Z7901 Long term (current) use of anticoagulants: Secondary | ICD-10-CM | POA: Diagnosis not present

## 2016-09-24 LAB — POCT INR: INR: 3.8

## 2016-10-01 ENCOUNTER — Other Ambulatory Visit: Payer: Self-pay | Admitting: Cardiovascular Disease

## 2016-10-08 ENCOUNTER — Encounter: Payer: Self-pay | Admitting: Gastroenterology

## 2016-10-29 ENCOUNTER — Ambulatory Visit (INDEPENDENT_AMBULATORY_CARE_PROVIDER_SITE_OTHER): Payer: Managed Care, Other (non HMO) | Admitting: *Deleted

## 2016-10-29 DIAGNOSIS — Z7901 Long term (current) use of anticoagulants: Secondary | ICD-10-CM

## 2016-10-29 LAB — POCT INR: INR: 4

## 2016-11-15 ENCOUNTER — Ambulatory Visit (INDEPENDENT_AMBULATORY_CARE_PROVIDER_SITE_OTHER): Payer: Managed Care, Other (non HMO) | Admitting: *Deleted

## 2016-11-15 DIAGNOSIS — Z7901 Long term (current) use of anticoagulants: Secondary | ICD-10-CM

## 2016-11-15 LAB — POCT INR: INR: 3.3

## 2016-11-27 ENCOUNTER — Encounter: Payer: Self-pay | Admitting: Emergency Medicine

## 2016-11-27 ENCOUNTER — Emergency Department (INDEPENDENT_AMBULATORY_CARE_PROVIDER_SITE_OTHER)
Admission: EM | Admit: 2016-11-27 | Discharge: 2016-11-27 | Disposition: A | Discharge status: Home / Self Care | Payer: Managed Care, Other (non HMO) | Source: Home / Self Care | Attending: Family Medicine | Admitting: Family Medicine

## 2016-11-27 DIAGNOSIS — R6889 Other general symptoms and signs: Secondary | ICD-10-CM

## 2016-11-27 DIAGNOSIS — E860 Dehydration: Secondary | ICD-10-CM

## 2016-11-27 LAB — COMPLETE METABOLIC PANEL WITH GFR
ALT: 25 U/L (ref 6–29)
AST: 30 U/L (ref 10–35)
Albumin: 4.2 g/dL (ref 3.6–5.1)
Alkaline Phosphatase: 89 U/L (ref 33–130)
BUN: 10 mg/dL (ref 7–25)
CO2: 27 mmol/L (ref 20–31)
Calcium: 8.8 mg/dL (ref 8.6–10.4)
Chloride: 103 mmol/L (ref 98–110)
Creat: 0.97 mg/dL (ref 0.50–1.05)
GFR, Est African American: 77 mL/min (ref >=60)
GFR, Est Non African American: 66 mL/min (ref >=60)
Glucose, Bld: 107 mg/dL — ABNORMAL HIGH (ref 65–99)
Potassium: 3.9 mmol/L (ref 3.5–5.3)
Sodium: 138 mmol/L (ref 135–146)
Total Bilirubin: 1.7 mg/dL — ABNORMAL HIGH (ref 0.2–1.2)
Total Protein: 7 g/dL (ref 6.1–8.1)

## 2016-11-27 LAB — POCT CBC W AUTO DIFF (K'VILLE URGENT CARE)

## 2016-11-27 MED ORDER — ONDANSETRON HCL 4 MG PO TABS: 4.0000 mg | ORAL_TABLET | Freq: Four times a day (QID) | ORAL | 0 refills | Status: DC

## 2016-11-27 NOTE — ED Provider Notes (Signed)
CSN: [id number]     Arrival date & time 11/27/16  1054 History   First MD Initiated Contact with Patient 11/27/16 1113     Chief Complaint  Patient presents with  . Headache  . Cough  . Generalized Body Aches  . Chills   (Consider location/radiation/quality/duration/timing/severity/associated sxs/prior Treatment) HPI Vanessa Owens is a 56 y.o. female presenting to UC with c/o generalized headache, nausea, fatigue, body aches for 3 days, and 1 episode of vomiting last night.  She has been having hot and cold chills w/o recorded fever.  Hx of migraines but states current headache does not feel like a migraine.  Pain is mild to moderate in severity. She has been working out in the yard more recently trying to get ready for company to come over. She has been eating and drinking well but still feels dehydrated with dry lips.  She also reports mild post-nasal drip with dry cough.  She has taken Tylenol and Dayquil w/o relief. She cannot take ibuprofen because she is on coumadin for aortic valve replacement.  Past Medical History:  Diagnosis Date  . Anemia   . Aortic stenosis     by Dr. Prescott Gum  . CHEST PAIN UNSPECIFIED    Qualifier: Diagnosis of  By: Burnett Harry MD, Shanon Brow    . Colon polyp   . Costochondritis    x2  . Diverticulosis   . IBS (irritable bowel syndrome)   . Long term current use of anticoagulant   . Migraine   . OAB (overactive bladder)   . Osteopenia   . Osteoporosis   . Rash   . S/P AVR    2001 for bicuspid AV.....Marland KitchenST JUDE  . Shingles    ? diag/rash 11/21/12  . Shortness of breath    Qualifier: Diagnosis of  By: Allean Found, LPN, [name]    . Vulvar ulceration    recurrent   Past Surgical History:  Procedure Laterality Date  . AORTIC VALVE REPLACEMENT  2001   St Jude  . APPENDECTOMY     post op abdominal wall hematoma/2 transfusions  . BREAST BIOPSY  5/08   left breast  . CESAREAN SECTION     Family History  Problem Relation Age of Onset  . Hypertension Mother    . Diabetes Mother   . Hyperlipidemia Mother   . Diabetes Brother   . Hyperlipidemia Brother   . Hypertension Brother   . Diabetes Son   . Alzheimer's disease Maternal Grandfather   . Diabetes Maternal Grandfather    Social History  Substance Use Topics  . Smoking status: Never Smoker  . Smokeless tobacco: Never Used  . Alcohol use 0.5 - 1.0 oz/week    1 - 2 Standard drinks or equivalent per week     Comment: occ   OB History    Gravida Para Term Preterm AB Living   2 2       2    SAB TAB Ectopic Multiple Live Births                 Review of Systems  Constitutional: Positive for chills and fatigue. Negative for fever.  HENT: Positive for congestion (minimal) and postnasal drip ( minimal). Negative for ear pain, sinus pain, sinus pressure, sore throat, trouble swallowing and voice change.   Respiratory: Positive for cough ( minimal). Negative for shortness of breath.   Cardiovascular: Negative for chest pain and palpitations.  Gastrointestinal: Positive for nausea and vomiting. Negative for abdominal pain and  diarrhea.  Musculoskeletal: Positive for arthralgias and myalgias. Negative for back pain.       Body aches.  Skin: Negative for rash.  Neurological: Positive for weakness (generalized) and headaches. Negative for dizziness, syncope, light-headedness and numbness.    Allergies  Morphine and Morphine and related  Home Medications   Prior to Admission medications   Medication Sig Start Date End Date Taking? Authorizing Provider  Calcium-Magnesium-Vitamin D (CITRACAL SLOW RELEASE PO) Take 2 capsules by mouth daily. 1200mg s + D3, Slow Release    [provider]  COUMADIN 7.5 MG tablet Take as directed by coumadin clinic 10/01/16   Josue Hector, MD  FIBER PO Take 1 tablet by mouth daily. Gummies    [provider]  Multiple Vitamins-Minerals (MULTIVITAMIN GUMMIES ADULTS PO) Take 2 tablets by mouth daily. Arnett    [provider]  omega-3  acid ethyl esters (LOVAZA) 1 G capsule Take 1 g by mouth daily.     [provider]  ondansetron (ZOFRAN) 4 MG tablet Take 1 tablet (4 mg total) by mouth every 6 (six) hours. 11/27/16   Noe Gens, PA-C   Meds Ordered and Administered this Visit  Medications - No data to display  BP (!) 148/87 (BP Location: Left Arm)   Pulse (!) 104   Temp 98.3 F (36.8 C) (Oral)   Resp 16   Ht 5\' 1"  (1.549 m)   Wt 164 lb 8 oz (74.6 kg)   LMP 05/18/2003   SpO2 96%   BMI 31.08 kg/m  No data found.   Physical Exam  Constitutional: She is oriented to person, place, and time. She appears well-developed and well-nourished. No distress.  HENT:  Head: Normocephalic and atraumatic.  Right Ear: Tympanic membrane normal.  Left Ear: Tympanic membrane normal.  Nose: Nose normal.  Mouth/Throat: Uvula is midline, oropharynx is clear and moist and mucous membranes are normal.  Eyes: EOM are normal.  Neck: Normal range of motion. Neck supple.  Cardiovascular: Normal rate and regular rhythm.   Pulmonary/Chest: Effort normal and breath sounds normal. No stridor. No respiratory distress. She has no wheezes. She has no rales.  Abdominal: Soft. She exhibits no distension. There is no tenderness.  Musculoskeletal: Normal range of motion.  Lymphadenopathy:    She has no cervical adenopathy.  Neurological: She is alert and oriented to person, place, and time.  Skin: Skin is warm and dry. No rash noted. She is not diaphoretic. No erythema.  Psychiatric: She has a normal mood and affect. Her behavior is normal.  Nursing note and vitals reviewed.   Urgent Care Course     Procedures (including critical care time)  Labs Review Labs Reviewed  COMPLETE METABOLIC PANEL WITH GFR  POCT CBC W AUTO DIFF ([facility])    Imaging Review No results found.   MDM   1. Flu-like symptoms   2. Mild dehydration    Pt c/o body aches, fatigue, hot and cold chills, along with nausea, vomiting x1 and  generalized headache.  Mild post-nasal drip.   CBC: unremarkable CMP: pending  Orthostatic vitals: HR 100-103, otherwise unremarkable. Pt slightly tachycardic compared to prior visits.   Symptoms likely due to viral illness and mild dehydration. Pt able to keep down fluids in UC  Rx: zofran Home care instructions provided. Work note for today and tomorrow provided. F/u with PCP in 3-4 days if not improving. Discussed symptoms that warrant emergent care in the ED.  Noe Gens, PA-C 11/27/16 1225

## 2016-11-27 NOTE — ED Triage Notes (Signed)
Patient presents to [facility] with C/O headache 6/10 , generalized body aches ,dry cough, not feeling well since Wednesday. She advised that she also has had intermittent nausea and vomited times 1 last PM.

## 2016-11-28 ENCOUNTER — Telehealth: Payer: Self-pay | Admitting: Emergency Medicine

## 2016-11-28 NOTE — Telephone Encounter (Signed)
Spoke with patient in regard to [facility], looks essentially ok per provider. Patient encouraged to take Tylenol and to stay hydrated, and get rest. She could return if needed or follow up with PCP on Monday.

## 2016-12-14 ENCOUNTER — Telehealth: Payer: Self-pay | Admitting: Family Medicine

## 2016-12-14 ENCOUNTER — Ambulatory Visit (INDEPENDENT_AMBULATORY_CARE_PROVIDER_SITE_OTHER): Payer: Managed Care, Other (non HMO) | Admitting: *Deleted

## 2016-12-14 DIAGNOSIS — Z7901 Long term (current) use of anticoagulants: Secondary | ICD-10-CM

## 2016-12-14 DIAGNOSIS — I359 Nonrheumatic aortic valve disorder, unspecified: Secondary | ICD-10-CM

## 2016-12-14 LAB — POCT INR: INR: 8

## 2016-12-14 LAB — PROTIME-INR
INR: 4.68
Prothrombin Time: 92.7 s — ABNORMAL HIGH (ref 9.1–12.0)
Prothrombin Time: 47 seconds — ABNORMAL HIGH (ref 11.4–15.2)

## 2016-12-14 NOTE — Telephone Encounter (Signed)
ok 

## 2016-12-14 NOTE — Telephone Encounter (Signed)
Patient scheduled new patient appointment with PCP for 03/04/2017. Patient was seen in the ED and would like to follow up prior, please advise if patient can be seen with Percell Miller prior

## 2016-12-14 NOTE — Telephone Encounter (Signed)
Relation to PB:DHDI Call back number:[phone]   Reason for call:  Patient would like to re establish with you, please advise

## 2016-12-16 NOTE — Telephone Encounter (Signed)
lvm advising patient of message below °

## 2016-12-24 ENCOUNTER — Ambulatory Visit (INDEPENDENT_AMBULATORY_CARE_PROVIDER_SITE_OTHER): Payer: 59 | Admitting: *Deleted

## 2016-12-24 DIAGNOSIS — Z7901 Long term (current) use of anticoagulants: Secondary | ICD-10-CM

## 2016-12-24 LAB — PROTIME-INR
INR: 9.9 (ref 0.8–1.2)
Prothrombin Time: 104.5 s — ABNORMAL HIGH (ref 9.1–12.0)

## 2016-12-24 LAB — POCT INR: INR: >8

## 2016-12-27 ENCOUNTER — Ambulatory Visit (INDEPENDENT_AMBULATORY_CARE_PROVIDER_SITE_OTHER): Payer: 59

## 2016-12-27 DIAGNOSIS — Z7901 Long term (current) use of anticoagulants: Secondary | ICD-10-CM

## 2016-12-27 LAB — POCT INR: INR: 2.5

## 2016-12-28 ENCOUNTER — Other Ambulatory Visit: Payer: Self-pay | Admitting: Cardiovascular Disease

## 2017-01-03 ENCOUNTER — Ambulatory Visit (INDEPENDENT_AMBULATORY_CARE_PROVIDER_SITE_OTHER): Payer: 59 | Admitting: *Deleted

## 2017-01-03 DIAGNOSIS — Z7901 Long term (current) use of anticoagulants: Secondary | ICD-10-CM | POA: Diagnosis not present

## 2017-01-03 LAB — POCT INR: INR: 3.2

## 2017-01-26 ENCOUNTER — Ambulatory Visit (INDEPENDENT_AMBULATORY_CARE_PROVIDER_SITE_OTHER): Payer: 59 | Admitting: Pharmacist

## 2017-01-26 DIAGNOSIS — Z7901 Long term (current) use of anticoagulants: Secondary | ICD-10-CM

## 2017-01-26 LAB — POCT INR: INR: 2.4

## 2017-02-21 ENCOUNTER — Telehealth: Payer: Self-pay | Admitting: *Deleted

## 2017-02-21 NOTE — Telephone Encounter (Signed)
Pt called to inform us that she started Penicillin & Percocet on Saturday night for mouth pain. She states she decreased her Coumadin to 1/2 tablet on Saturday & Sunday. Also, she stated she went to the ENT Doctor & she stated she was prescribed Flagyl bid for 14 days (28 tabs). Advised that the med will interfere with Coumadin & will need to be seen on Wednesday. Pt is not sure she will be able to come on Wednesday but will try Thursday. She is aware that the med can increase her INR & will call on Wednesday if she does feel better & change appt. Pt advised to call & not walk-in as we have a busy schedule & she verbalized understanding. Pt will eat some dark green leafy veggies when she starts the Flagyl.

## 2017-02-24 ENCOUNTER — Ambulatory Visit (INDEPENDENT_AMBULATORY_CARE_PROVIDER_SITE_OTHER): Payer: 59 | Admitting: *Deleted

## 2017-02-24 DIAGNOSIS — I359 Nonrheumatic aortic valve disorder, unspecified: Secondary | ICD-10-CM | POA: Diagnosis not present

## 2017-02-24 DIAGNOSIS — Z952 Presence of prosthetic heart valve: Secondary | ICD-10-CM

## 2017-02-24 DIAGNOSIS — Z7901 Long term (current) use of anticoagulants: Secondary | ICD-10-CM

## 2017-02-24 DIAGNOSIS — Z5181 Encounter for therapeutic drug level monitoring: Secondary | ICD-10-CM | POA: Diagnosis not present

## 2017-02-24 LAB — PROTIME-INR
INR: 7.4 (ref 0.8–1.2)
Prothrombin Time: 78.5 s — ABNORMAL HIGH (ref 9.1–12.0)

## 2017-02-24 LAB — POCT INR: INR: 8

## 2017-03-03 ENCOUNTER — Ambulatory Visit (INDEPENDENT_AMBULATORY_CARE_PROVIDER_SITE_OTHER): Payer: 59 | Admitting: *Deleted

## 2017-03-03 DIAGNOSIS — Z952 Presence of prosthetic heart valve: Secondary | ICD-10-CM

## 2017-03-03 DIAGNOSIS — Z5181 Encounter for therapeutic drug level monitoring: Secondary | ICD-10-CM | POA: Diagnosis not present

## 2017-03-03 DIAGNOSIS — Z7901 Long term (current) use of anticoagulants: Secondary | ICD-10-CM

## 2017-03-03 LAB — POCT INR: INR: 1.7

## 2017-03-04 ENCOUNTER — Telehealth: Payer: Self-pay | Admitting: Family Medicine

## 2017-03-04 ENCOUNTER — Ambulatory Visit: Payer: Managed Care, Other (non HMO) | Admitting: Family Medicine

## 2017-03-04 NOTE — Telephone Encounter (Signed)
no

## 2017-03-04 NOTE — Telephone Encounter (Signed)
Pt called in to make provider aware that she is stuck at work and is unable to make her apt today. No showed pt. And rescheduled her apt.    Should pt be charged no show fee?

## 2017-03-18 ENCOUNTER — Ambulatory Visit (INDEPENDENT_AMBULATORY_CARE_PROVIDER_SITE_OTHER): Payer: 59 | Admitting: Pharmacist

## 2017-03-18 DIAGNOSIS — Z7901 Long term (current) use of anticoagulants: Secondary | ICD-10-CM | POA: Diagnosis not present

## 2017-03-18 LAB — POCT INR: INR: 3.3

## 2017-04-01 ENCOUNTER — Ambulatory Visit (INDEPENDENT_AMBULATORY_CARE_PROVIDER_SITE_OTHER): Payer: 59 | Admitting: *Deleted

## 2017-04-01 DIAGNOSIS — I359 Nonrheumatic aortic valve disorder, unspecified: Secondary | ICD-10-CM | POA: Diagnosis not present

## 2017-04-01 DIAGNOSIS — Z7901 Long term (current) use of anticoagulants: Secondary | ICD-10-CM | POA: Diagnosis not present

## 2017-04-01 LAB — POCT INR: INR: 2.4

## 2017-04-01 NOTE — Patient Instructions (Signed)
Today take 1 tablet then continue taking 1 tablet daily except 1/2 tablet on Mondays, Wednesdays and Fridays.  Recheck in 3 weeks. Coumadin Clinic [phone]

## 2017-04-22 ENCOUNTER — Ambulatory Visit (INDEPENDENT_AMBULATORY_CARE_PROVIDER_SITE_OTHER): Payer: 59 | Admitting: *Deleted

## 2017-04-22 DIAGNOSIS — Z7901 Long term (current) use of anticoagulants: Secondary | ICD-10-CM

## 2017-04-22 DIAGNOSIS — I359 Nonrheumatic aortic valve disorder, unspecified: Secondary | ICD-10-CM

## 2017-04-22 LAB — POCT INR: INR: 1.9

## 2017-04-22 NOTE — Patient Instructions (Signed)
Today take 1 tablet then start taking 1 tablet daily except 1/2 tablet on Mondays and Fridays.  Recheck in 2 weeks. Coumadin Clinic [phone]

## 2017-05-05 ENCOUNTER — Ambulatory Visit (INDEPENDENT_AMBULATORY_CARE_PROVIDER_SITE_OTHER): Payer: 59 | Admitting: *Deleted

## 2017-05-05 DIAGNOSIS — Z7901 Long term (current) use of anticoagulants: Secondary | ICD-10-CM

## 2017-05-05 DIAGNOSIS — Z5181 Encounter for therapeutic drug level monitoring: Secondary | ICD-10-CM

## 2017-05-05 DIAGNOSIS — Z952 Presence of prosthetic heart valve: Secondary | ICD-10-CM

## 2017-05-05 LAB — POCT INR: INR: 2.7

## 2017-05-05 NOTE — Patient Instructions (Signed)
Description   Continue taking 1 tablet daily except 1/2 tablet on Mondays and Fridays. Recheck in 3 weeks. Coumadin Clinic: [phone].     

## 2017-05-06 ENCOUNTER — Ambulatory Visit (INDEPENDENT_AMBULATORY_CARE_PROVIDER_SITE_OTHER): Payer: 59 | Admitting: Family Medicine

## 2017-05-06 ENCOUNTER — Encounter: Payer: Self-pay | Admitting: Family Medicine

## 2017-05-06 VITALS — BP 138/90 | HR 76 | Temp 98.1°F | Ht 61.0 in | Wt 158.0 lb

## 2017-05-06 DIAGNOSIS — K648 Other hemorrhoids: Secondary | ICD-10-CM | POA: Diagnosis not present

## 2017-05-06 DIAGNOSIS — Z952 Presence of prosthetic heart valve: Secondary | ICD-10-CM | POA: Diagnosis not present

## 2017-05-06 DIAGNOSIS — Z86010 Personal history of colonic polyps: Secondary | ICD-10-CM | POA: Insufficient documentation

## 2017-05-06 DIAGNOSIS — R5383 Other fatigue: Secondary | ICD-10-CM | POA: Insufficient documentation

## 2017-05-06 NOTE — Patient Instructions (Signed)
Hemorrhoids Hemorrhoids are swollen veins in and around the rectum or anus. There are two types of hemorrhoids:  Internal hemorrhoids. These occur in the veins that are just inside the rectum. They may poke through to the outside and become irritated and painful.  External hemorrhoids. These occur in the veins that are outside of the anus and can be felt as a painful swelling or hard lump near the anus.  Most hemorrhoids do not cause serious problems, and they can be managed with home treatments such as diet and lifestyle changes. If home treatments do not help your symptoms, procedures can be done to shrink or remove the hemorrhoids. What are the causes? This condition is caused by increased pressure in the anal area. This pressure may result from various things, including:  Constipation.  Straining to have a bowel movement.  Diarrhea.  Pregnancy.  Obesity.  Sitting for long periods of time.  Heavy lifting or other activity that causes you to strain.  Anal sex.  What are the signs or symptoms? Symptoms of this condition include:  Pain.  Anal itching or irritation.  Rectal bleeding.  Leakage of stool (feces).  Anal swelling.  One or more lumps around the anus.  How is this diagnosed? This condition can often be diagnosed through a visual exam. Other exams or tests may also be done, such as:  Examination of the rectal area with a gloved hand (digital rectal exam).  Examination of the anal canal using a small tube (anoscope).  A blood test, if you have lost a significant amount of blood.  A test to look inside the colon (sigmoidoscopy or colonoscopy).  How is this treated? This condition can usually be treated at home. However, various procedures may be done if dietary changes, lifestyle changes, and other home treatments do not help your symptoms. These procedures can help make the hemorrhoids smaller or remove them completely. Some of these procedures involve  surgery, and others do not. Common procedures include:  Rubber band ligation. Rubber bands are placed at the base of the hemorrhoids to cut off the blood supply to them.  Sclerotherapy. Medicine is injected into the hemorrhoids to shrink them.  Infrared coagulation. A type of light energy is used to get rid of the hemorrhoids.  Hemorrhoidectomy surgery. The hemorrhoids are surgically removed, and the veins that supply them are tied off.  Stapled hemorrhoidopexy surgery. A circular stapling device is used to remove the hemorrhoids and use staples to cut off the blood supply to them.  Follow these instructions at home: Eating and drinking  Eat foods that have a lot of fiber in them, such as whole grains, beans, nuts, fruits, and vegetables. Ask your health care provider about taking products that have added fiber (fiber supplements).  Drink enough fluid to keep your urine clear or pale yellow. Managing pain and swelling  Take warm sitz baths for 20 minutes, 3-4 times a day to ease pain and discomfort.  If directed, apply ice to the affected area. Using ice packs between sitz baths may be helpful. ? Put ice in a plastic bag. ? Place a towel between your skin and the bag. ? Leave the ice on for 20 minutes, 2-3 times a day. General instructions  Take over-the-counter and prescription medicines only as told by your health care provider.  Use medicated creams or suppositories as told.  Exercise regularly.  Go to the bathroom when you have the urge to have a bowel movement. Do not wait.    Avoid straining to have bowel movements.  Keep the anal area dry and clean. Use wet toilet paper or moist towelettes after a bowel movement.  Do not sit on the toilet for long periods of time. This increases blood pooling and pain. Contact a health care provider if:  You have increasing pain and swelling that are not controlled by treatment or medicine.  You have uncontrolled bleeding.  You  have difficulty having a bowel movement, or you are unable to have a bowel movement.  You have pain or inflammation outside the area of the hemorrhoids. This information is not intended to replace advice given to you by your health care provider. Make sure you discuss any questions you have with your health care provider. Document Released: 04/30/2000 Document Revised: 10/01/2015 Document Reviewed: 01/15/2015 Elsevier Interactive Patient Education  2018 Elsevier Inc.  

## 2017-05-06 NOTE — Assessment & Plan Note (Signed)
Due for repeat colon Refer to GI

## 2017-05-06 NOTE — Assessment & Plan Note (Signed)
On coumadin Per cardiology

## 2017-05-06 NOTE — Progress Notes (Signed)
Subjective:  I acted as a Education administrator for [employer], [place]   Patient ID: Vanessa Owens, female    DOB: 10/22/1960, 56 y.o.   MRN: [id number]    HPI  Patient is in today to establish care.  Pt sees cardiology and gyn.   She needs a new GI dr because hers has retired.  She has hemorrhoids that need tx and hx colon polyps  Patient Care Team: Carollee Herter, Alferd Apa, DO as PCP - General Josue Hector, MD as Consulting Physician (Cardiology) Megan Salon, MD as Consulting Physician (Gynecology)   Past Medical History:  Diagnosis Date  . Anemia   . Aortic stenosis     by Dr. Prescott Gum  . CHEST PAIN UNSPECIFIED    Qualifier: Diagnosis of  By: Burnett Harry MD, Shanon Brow    . Colon polyp   . Costochondritis    x2  . Diverticulosis   . IBS (irritable bowel syndrome)   . Long term current use of anticoagulant   . Migraine   . OAB (overactive bladder)   . Osteopenia   . Osteoporosis   . Rash   . S/P AVR    2001 for bicuspid AV.....Marland KitchenST JUDE  . Shingles    ? diag/rash 11/21/12  . Shortness of breath    Qualifier: Diagnosis of  By: Allean Found, LPN, [name]    . Vulvar ulceration    recurrent    Past Surgical History:  Procedure Laterality Date  . AORTIC VALVE REPLACEMENT  2001   St Jude  . APPENDECTOMY     post op abdominal wall hematoma/2 transfusions  . BREAST BIOPSY  5/08   left breast  . CESAREAN SECTION      Family History  Problem Relation Age of Onset  . Hypertension Mother   . Diabetes Mother   . Hyperlipidemia Mother   . Diabetes Brother   . Hyperlipidemia Brother   . Hypertension Brother   . Diabetes Son   . Alzheimer's disease Maternal Grandfather   . Diabetes Maternal Grandfather     Social History   Socioeconomic History  . Marital status: Single    Spouse name: Not on file  . Number of children: 2  . Years of education: Not on file  . Highest education level: Not on file  Social Needs  . Financial resource strain: Not on file  . Food insecurity  - worry: Not on file  . Food insecurity - inability: Not on file  . Transportation needs - medical: Not on file  . Transportation needs - non-medical: Not on file  Occupational History  . Occupation: Catering manager: [employer]  Tobacco Use  . Smoking status: Never Smoker  . Smokeless tobacco: Never Used  Substance and Sexual Activity  . Alcohol use: Yes    Alcohol/week: 0.5 - 1.0 oz    Types: 1 - 2 Standard drinks or equivalent per week    Comment: occ  . Drug use: No  . Sexual activity: Yes    Partners: Male    Birth control/protection: Post-menopausal  Other Topics Concern  . Not on file  Social History Narrative  . Not on file    Outpatient Medications Prior to Visit  Medication Sig Dispense Refill  . COUMADIN 7.5 MG tablet TAKE AS DIRECTED BY COUMADIN CLINIC 90 tablet 0  . Calcium-Magnesium-Vitamin D (CITRACAL SLOW RELEASE PO) Take 2 capsules by mouth daily. 1200mg s + D3, Slow Release    .  FIBER PO Take 1 tablet by mouth daily. Gummies    . Multiple Vitamins-Minerals (MULTIVITAMIN GUMMIES ADULTS PO) Take 2 tablets by mouth daily. GUMMIES    . omega-3 acid ethyl esters (LOVAZA) 1 G capsule Take 1 g by mouth daily.     . ondansetron (ZOFRAN) 4 MG tablet Take 1 tablet (4 mg total) by mouth every 6 (six) hours. (Patient not taking: Reported on 05/06/2017) 12 tablet 0   No facility-administered medications prior to visit.     Allergies  Allergen Reactions  . Morphine     REACTION: sick  . Morphine And Related Nausea Only    Review of Systems  Constitutional: Positive for malaise/fatigue. Negative for chills and fever.  HENT: Negative for congestion and hearing loss.   Eyes: Negative for discharge.  Respiratory: Negative for cough, sputum production and shortness of breath.   Cardiovascular: Negative for chest pain, palpitations and leg swelling.  Gastrointestinal: Negative for abdominal pain, blood in stool, constipation, diarrhea, heartburn,  nausea and vomiting.  Genitourinary: Negative for dysuria, frequency, hematuria and urgency.  Musculoskeletal: Negative for back pain, falls and myalgias.  Skin: Negative for rash.  Neurological: Negative for dizziness, sensory change, loss of consciousness, weakness and headaches.  Endo/Heme/Allergies: Negative for environmental allergies. Does not bruise/bleed easily.  Psychiatric/Behavioral: Negative for depression and suicidal ideas. The patient is not nervous/anxious and does not have insomnia.        Objective:    Physical Exam  Constitutional: She is oriented to person, place, and time. She appears well-developed and well-nourished.  HENT:  Head: Normocephalic and atraumatic.  Eyes: Conjunctivae and EOM are normal.  Neck: Normal range of motion. Neck supple. No JVD present. Carotid bruit is not present. No thyromegaly present.  Cardiovascular: Normal rate, regular rhythm and normal heart sounds.  No murmur heard. Pulmonary/Chest: Effort normal and breath sounds normal. No respiratory distress. She has no wheezes. She has no rales. She exhibits no tenderness.  Musculoskeletal: She exhibits no edema.  Neurological: She is alert and oriented to person, place, and time.  Psychiatric: She has a normal mood and affect.  Nursing note and vitals reviewed.   BP 138/90   Pulse 76   Temp 98.1 F (36.7 C) (Oral)   Ht 5\' 1"  (1.549 m)   Wt 158 lb (71.7 kg)   LMP 05/18/2003   SpO2 98%   BMI 29.85 kg/m  Wt Readings from Last 3 Encounters:  05/06/17 158 lb (71.7 kg)  11/27/16 164 lb 8 oz (74.6 kg)  07/19/16 161 lb (73 kg)   BP Readings from Last 3 Encounters:  05/06/17 138/90  11/27/16 (!) 148/87  07/19/16 128/70     Immunization History  Administered Date(s) Administered  . Influenza Whole 04/25/2007  . Influenza-Unspecified 01/15/2013, 02/14/2017  . Tdap 10/30/2009    Health Maintenance  Topic Date Due  . Hepatitis C Screening  [birth year]  . HIV Screening  02/28/1977   . INFLUENZA VACCINE  12/15/2016  . MAMMOGRAM  11/03/2017  . COLONOSCOPY  11/23/2018  . PAP SMEAR  07/20/2019  . TETANUS/TDAP  10/31/2019    Lab Results  Component Value Date   WBC 5.0 08/29/2014   HGB 14.5 08/29/2014   HCT 41.2 08/29/2014   PLT 168.0 08/29/2014   GLUCOSE 107 (H) 11/27/2016   CHOL 197 06/28/2013   TRIG 81.0 06/28/2013   HDL 45.00 06/28/2013   LDLCALC 136 (H) 06/28/2013   ALT 25 11/27/2016   AST 30 11/27/2016   NA  138 11/27/2016   K 3.9 11/27/2016   CL 103 11/27/2016   CREATININE 0.97 11/27/2016   BUN 10 11/27/2016   CO2 27 11/27/2016   TSH 2.234 11/15/2014   INR 2.7 05/05/2017   HGBA1C 5.2 06/28/2013    Lab Results  Component Value Date   TSH 2.234 11/15/2014   Lab Results  Component Value Date   WBC 5.0 08/29/2014   HGB 14.5 08/29/2014   HCT 41.2 08/29/2014   MCV 90.4 08/29/2014   PLT 168.0 08/29/2014   Lab Results  Component Value Date   NA 138 11/27/2016   K 3.9 11/27/2016   CO2 27 11/27/2016   GLUCOSE 107 (H) 11/27/2016   BUN 10 11/27/2016   CREATININE 0.97 11/27/2016   BILITOT 1.7 (H) 11/27/2016   ALKPHOS 89 11/27/2016   AST 30 11/27/2016   ALT 25 11/27/2016   PROT 7.0 11/27/2016   ALBUMIN 4.2 11/27/2016   CALCIUM 8.8 11/27/2016   GFR 72.53 08/29/2014   Lab Results  Component Value Date   CHOL 197 06/28/2013   Lab Results  Component Value Date   HDL 45.00 06/28/2013   Lab Results  Component Value Date   LDLCALC 136 (H) 06/28/2013   Lab Results  Component Value Date   TRIG 81.0 06/28/2013   Lab Results  Component Value Date   CHOLHDL 4 06/28/2013   Lab Results  Component Value Date   HGBA1C 5.2 06/28/2013         Assessment & Plan:   Problem List Items Addressed This Visit      Unprioritized   Fatigue   Hx of colonic polyps - Primary    Due for repeat colon Refer to GI      Internal hemorrhoid    Refer to Gi She would like them removed      S/P aortic valve replacement    On coumadin Per  cardiology         I have discontinued [name] "[name]"'s ondansetron. I am also having her maintain her FIBER PO, omega-3 acid ethyl esters, Multiple Vitamins-Minerals (MULTIVITAMIN GUMMIES ADULTS PO), Calcium-Magnesium-Vitamin D (CITRACAL SLOW RELEASE PO), and COUMADIN.  No orders of the defined types were placed in this encounter.   CMA served as Education administrator during this visit. History, Physical and Plan performed by medical provider. Documentation and orders reviewed and attested to.  [name], DO

## 2017-05-06 NOTE — Assessment & Plan Note (Addendum)
Refer to Gi She would like them removed

## 2017-05-27 ENCOUNTER — Ambulatory Visit (INDEPENDENT_AMBULATORY_CARE_PROVIDER_SITE_OTHER): Payer: 59 | Admitting: Pharmacist

## 2017-05-27 DIAGNOSIS — Z7901 Long term (current) use of anticoagulants: Secondary | ICD-10-CM

## 2017-05-27 LAB — POCT INR: INR: 2.6

## 2017-05-27 NOTE — Patient Instructions (Signed)
Description   Continue taking 1 tablet daily except 1/2 tablet on Mondays and Fridays. Recheck in 4 weeks. Coumadin Clinic: [phone].     

## 2017-07-07 ENCOUNTER — Ambulatory Visit (INDEPENDENT_AMBULATORY_CARE_PROVIDER_SITE_OTHER): Payer: 59 | Admitting: *Deleted

## 2017-07-07 DIAGNOSIS — Z7901 Long term (current) use of anticoagulants: Secondary | ICD-10-CM

## 2017-07-07 LAB — POCT INR: INR: 2.7

## 2017-07-07 MED ORDER — COUMADIN 7.5 MG PO TABS: ORAL_TABLET | ORAL | 0 refills | Status: DC

## 2017-07-07 NOTE — Patient Instructions (Signed)
Description   Continue taking 1 tablet daily except 1/2 tablet on Mondays and Fridays. Recheck in 4 weeks. Coumadin Clinic: [phone].     

## 2017-07-14 NOTE — Progress Notes (Signed)
Patient ID: Vanessa Owens, female   DOB: Jul 28, 1960, 57 y.o.   MRN: [id number]   57 y.o. 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 On chronic coumadin Wants her f/u echo's at [place] Due to cost. Last echo 10/17/15 valve ok no perivalvular regurgitation  Mean gradient 28 mm Hg peak 55 mmHg  New house in 2017  Working too much at [employer] center and [employer] Has  grand baby in [place] that she misses  07/15/16 Atypical chest pain. F/U ETT 07/28/16 was normal   Had a tooth infection or something that caused facial swelling in October had SBE For crown and got antibiotics  ROS: Denies fever, malais, weight loss, blurry vision, decreased visual acuity, cough, sputum, SOB, hemoptysis, pleuritic pain, palpitaitons, heartburn, abdominal pain, melena, lower extremity edema, claudication, or rash.  All other systems reviewed and negative  General: Vitals:   07/19/17 1022  BP: 122/84  Pulse: 65  SpO2: 97%   Affect appropriate Healthy:  appears stated age 69: normal Neck supple with no adenopathy JVP normal no bruits no thyromegaly Lungs clear with no wheezing and good diaphragmatic motion Heart:  W9/V9 click mechanical valve SEM  No AR  murmur, no rub, gallop or click PMI normal Abdomen: benighn, BS positve, no tenderness, no AAA no bruit.  No HSM or HJR Distal pulses intact with no bruits No edema Neuro non-focal Skin warm and dry No muscular weakness   Current Outpatient Medications  Medication Sig Dispense Refill  . amoxicillin (AMOXIL) 500 MG capsule as directed.  1  . Calcium-Magnesium-Vitamin D (CITRACAL SLOW RELEASE PO) Take 2 capsules by mouth daily. 1200mg s + D3, Slow Release    . COUMADIN 7.5 MG tablet TAKE AS DIRECTED BY COUMADIN CLINIC 30 tablet 0  . FIBER PO Take 1 tablet by mouth daily. Gummies    . Multiple Vitamins-Minerals (MULTIVITAMIN GUMMIES ADULTS PO) Take 2 tablets by mouth daily. GUMMIES    . omega-3 acid ethyl esters (LOVAZA) 1 G capsule  Take 1 g by mouth daily.      No current facility-administered medications for this visit.     Allergies  Morphine and Morphine and related  Electrocardiogram: 07/19/17 SR rate 66 normal   Assessment and Plan  AVR:  2001 for bicuspid valve trivial  perivalvular regurgitation last echo 10/17/15  SBE   Will update echo June this year  LLQ Pain:  F/u [name] on viberzi  ? IBS Anticoagulation no bleeding issues  Coumadin clinic 07/07/17 INR 2.7 Rx  Depression:  F/u Dr [name] could use SSRI Chest Pain:  Resolved normal ETT 07/28/16   [name]

## 2017-07-19 ENCOUNTER — Ambulatory Visit (INDEPENDENT_AMBULATORY_CARE_PROVIDER_SITE_OTHER): Payer: 59 | Admitting: Cardiovascular Disease

## 2017-07-19 ENCOUNTER — Encounter: Payer: Self-pay | Admitting: Cardiovascular Disease

## 2017-07-19 VITALS — BP 122/84 | HR 65 | Ht 61.0 in | Wt 154.0 lb

## 2017-07-19 DIAGNOSIS — Z952 Presence of prosthetic heart valve: Secondary | ICD-10-CM

## 2017-07-19 DIAGNOSIS — Z7901 Long term (current) use of anticoagulants: Secondary | ICD-10-CM

## 2017-07-19 DIAGNOSIS — I359 Nonrheumatic aortic valve disorder, unspecified: Secondary | ICD-10-CM | POA: Diagnosis not present

## 2017-07-19 NOTE — Patient Instructions (Addendum)
Medication Instructions:  Your physician recommends that you continue on your current medications as directed. Please refer to the Current Medication list given to you today.  Labwork: NONE  Testing/Procedures: Your physician has requested that you have an echocardiogram in June. Echocardiography is a painless test that uses sound waves to create images of your heart. It provides your doctor with information about the size and shape of your heart and how well your heart's chambers and valves are working. This procedure takes approximately one hour. There are no restrictions for this procedure.  Follow-Up: Your physician wants you to follow-up in: 12 months with Dr. Johnsie Cancel. You will receive a reminder letter in the mail two months in advance. If you don't receive a letter, please call our office to schedule the follow-up appointment.   If you need a refill on your cardiac medications before your next appointment, please call your pharmacy.

## 2017-08-08 ENCOUNTER — Ambulatory Visit (INDEPENDENT_AMBULATORY_CARE_PROVIDER_SITE_OTHER): Payer: 59 | Admitting: *Deleted

## 2017-08-08 DIAGNOSIS — Z7901 Long term (current) use of anticoagulants: Secondary | ICD-10-CM

## 2017-08-08 LAB — POCT INR: INR: 1.6

## 2017-08-08 NOTE — Patient Instructions (Addendum)
Description   Today take 1 tablet and tomorrow take 1.5 tablets then start taking 1 tablet daily except 1/2 tablet on Mondays.  Recheck in 1 week. Coumadin Clinic [phone]

## 2017-08-14 ENCOUNTER — Other Ambulatory Visit: Payer: Self-pay | Admitting: Cardiology

## 2017-08-18 ENCOUNTER — Encounter: Payer: Self-pay | Admitting: Family Medicine

## 2017-08-18 ENCOUNTER — Ambulatory Visit (INDEPENDENT_AMBULATORY_CARE_PROVIDER_SITE_OTHER): Payer: 59 | Admitting: Family Medicine

## 2017-08-18 VITALS — BP 138/88 | HR 70 | Resp 16 | Ht 61.0 in | Wt 152.6 lb

## 2017-08-18 DIAGNOSIS — Z0000 Encounter for general adult medical examination without abnormal findings: Secondary | ICD-10-CM | POA: Diagnosis not present

## 2017-08-18 DIAGNOSIS — Z952 Presence of prosthetic heart valve: Secondary | ICD-10-CM | POA: Diagnosis not present

## 2017-08-18 DIAGNOSIS — Z1159 Encounter for screening for other viral diseases: Secondary | ICD-10-CM

## 2017-08-18 LAB — COMPREHENSIVE METABOLIC PANEL
ALT: 19 U/L (ref 0–35)
AST: 21 U/L (ref 0–37)
Albumin: 4.2 g/dL (ref 3.5–5.2)
Alkaline Phosphatase: 77 U/L (ref 39–117)
BILIRUBIN TOTAL: 1.3 mg/dL — AB (ref 0.2–1.2)
BUN: 17 mg/dL (ref 6–23)
CHLORIDE: 101 meq/L (ref 96–112)
CO2: 28 meq/L (ref 19–32)
CREATININE: 0.7 mg/dL (ref 0.40–1.20)
Calcium: 9.5 mg/dL (ref 8.4–10.5)
GFR: 92.18 mL/min (ref >60.00)
Glucose, Bld: 89 mg/dL (ref 70–99)
Potassium: 3.8 mEq/L (ref 3.5–5.1)
SODIUM: 137 meq/L (ref 135–145)
Total Protein: 7.4 g/dL (ref 6.0–8.3)

## 2017-08-18 LAB — LIPID PANEL
CHOL/HDL RATIO: 4
Cholesterol: 204 mg/dL — ABNORMAL HIGH (ref 0–200)
HDL: 57.9 mg/dL (ref >39.00)
LDL CALC: 126 mg/dL — AB (ref 0–99)
NonHDL: 146.25
Triglycerides: 99 mg/dL (ref 0.0–149.0)
VLDL: 19.8 mg/dL (ref 0.0–40.0)

## 2017-08-18 LAB — CBC WITH DIFFERENTIAL/PLATELET
BASOS ABS: 0 10*3/uL (ref 0.0–0.1)
BASOS PCT: 0.5 % (ref 0.0–3.0)
EOS ABS: 0.1 10*3/uL (ref 0.0–0.7)
Eosinophils Relative: 2 % (ref 0.0–5.0)
HCT: 39.5 % (ref 36.0–46.0)
HEMOGLOBIN: 13.7 g/dL (ref 12.0–15.0)
LYMPHS PCT: 36.5 % (ref 12.0–46.0)
Lymphs Abs: 1.5 10*3/uL (ref 0.7–4.0)
MCHC: 34.6 g/dL (ref 30.0–36.0)
MCV: 90.5 fl (ref 78.0–100.0)
MONO ABS: 0.4 10*3/uL (ref 0.1–1.0)
Monocytes Relative: 9.3 % (ref 3.0–12.0)
Neutro Abs: 2.1 10*3/uL (ref 1.4–7.7)
Neutrophils Relative %: 51.7 % (ref 43.0–77.0)
Platelets: 166 10*3/uL (ref 150.0–400.0)
RBC: 4.36 Mil/uL (ref 3.87–5.11)
RDW: 13.9 % (ref 11.5–15.5)
WBC: 4 10*3/uL (ref 4.0–10.5)

## 2017-08-18 LAB — TSH: TSH: 4.42 u[IU]/mL (ref 0.35–4.50)

## 2017-08-18 NOTE — Assessment & Plan Note (Signed)
Per cardiology  on coumadin 

## 2017-08-18 NOTE — Progress Notes (Signed)
Subjective:     Vanessa Owens is a 57 y.o. female and is here for a comprehensive physical exam. The patient reports no problems.  Social History   Socioeconomic History  . Marital status: Single    Spouse name: Not on file  . Number of children: 2  . Years of education: Not on file  . Highest education level: Not on file  Occupational History  . Occupation: Catering manager: [employer]  Social Needs  . Financial resource strain: Not on file  . Food insecurity:    Worry: Not on file    Inability: Not on file  . Transportation needs:    Medical: Not on file    Non-medical: Not on file  Tobacco Use  . Smoking status: Never Smoker  . Smokeless tobacco: Never Used  Substance and Sexual Activity  . Alcohol use: Yes    Alcohol/week: 0.5 - 1.0 oz    Types: 1 - 2 Standard drinks or equivalent per week    Comment: occ  . Drug use: No  . Sexual activity: Yes    Partners: Male    Birth control/protection: Post-menopausal  Lifestyle  . Physical activity:    Days per week: Not on file    Minutes per session: Not on file  . Stress: Not on file  Relationships  . Social connections:    Talks on phone: Not on file    Gets together: Not on file    Attends religious service: Not on file    Active member of club or organization: Not on file    Attends meetings of clubs or organizations: Not on file    Relationship status: Not on file  . Intimate partner violence:    Fear of current or ex partner: Not on file    Emotionally abused: Not on file    Physically abused: Not on file    Forced sexual activity: Not on file  Other Topics Concern  . Not on file  Social History Narrative  . Not on file   Health Maintenance  Topic Date Due  . Hepatitis C Screening  1960/10/01  . HIV Screening  02/28/1977  . MAMMOGRAM  11/03/2016  . INFLUENZA VACCINE  12/15/2017  . COLONOSCOPY  11/23/2018  . PAP SMEAR  07/20/2019  . TETANUS/TDAP  10/31/2019    The following  portions of the patient's history were reviewed and updated as appropriate:  She  has a past medical history of Anemia, Aortic stenosis, CHEST PAIN UNSPECIFIED, Colon polyp, Costochondritis, Diverticulosis, IBS (irritable bowel syndrome), Long term current use of anticoagulant, Migraine, OAB (overactive bladder), Osteopenia, Osteoporosis, Rash, S/P AVR, Shingles, Shortness of breath, and Vulvar ulceration. She does not have any pertinent problems on file. She  has a past surgical history that includes Aortic valve replacement (2001); Cesarean section; Appendectomy; and Breast biopsy (5/08). Her family history includes Alzheimer's disease in her maternal grandfather; Diabetes in her brother, maternal grandfather, mother, and son; Hyperlipidemia in her brother and mother; Hypertension in her brother and mother. She  reports that she has never smoked. She has never used smokeless tobacco. She reports that she drinks about 0.5 - 1.0 oz of alcohol per week. She reports that she does not use drugs. She has a current medication list which includes the following prescription(s): amoxicillin, calcium-magnesium-vitamin d, coumadin, fiber, multiple vitamins-minerals, and omega-3 acid ethyl esters. Current Outpatient Medications on File Prior to Visit  Medication Sig Dispense Refill  . amoxicillin (  AMOXIL) 500 MG capsule as directed.  1  . Calcium-Magnesium-Vitamin D (CITRACAL SLOW RELEASE PO) Take 2 capsules by mouth daily. 1200mg s + D3, Slow Release    . COUMADIN 7.5 MG tablet TAKE AS DIRECTED BY COUMADIN CLINIC 30 tablet 1  . FIBER PO Take 1 tablet by mouth daily. Gummies    . Multiple Vitamins-Minerals (MULTIVITAMIN GUMMIES ADULTS PO) Take 2 tablets by mouth daily. GUMMIES    . omega-3 acid ethyl esters (LOVAZA) 1 G capsule Take 1 g by mouth daily.      No current facility-administered medications on file prior to visit.    She is allergic to morphine and morphine and related..  Review of Systems Review  of Systems  Constitutional: Negative for activity change, appetite change and fatigue.  HENT: Negative for hearing loss, congestion, tinnitus and ear discharge.  dentist q8m Eyes: Negative for visual disturbance (see optho q1y -- vision corrected to 20/20 with glasses).  Respiratory: Negative for cough, chest tightness and shortness of breath.   Cardiovascular: Negative for chest pain, palpitations and leg swelling.  Gastrointestinal: Negative for abdominal pain, diarrhea, constipation and abdominal distention.  Genitourinary: Negative for urgency, frequency, decreased urine volume and difficulty urinating.  Musculoskeletal: Negative for back pain, arthralgias and gait problem.  Skin: Negative for color change, pallor and rash.  Neurological: Negative for dizziness, light-headedness, numbness and headaches.  Hematological: Negative for adenopathy. Does not bruise/bleed easily.  Psychiatric/Behavioral: Negative for suicidal ideas, confusion, sleep disturbance, self-injury, dysphoric mood, decreased concentration and agitation.      Objective:    BP 138/88 (BP Location: Right Arm, Patient Position: Sitting, Cuff Size: Normal)   Pulse 70   Resp 16   Ht 5\' 1"  (1.549 m)   Wt 152 lb 9.6 oz (69.2 kg)   LMP 05/18/2003   SpO2 99%   BMI 28.83 kg/m  General appearance: alert, cooperative, appears stated age and no distress Head: Normocephalic, without obvious abnormality, atraumatic Eyes: negative findings: lids and lashes normal, conjunctivae and sclerae normal and pupils equal, round, reactive to light and accomodation Ears: normal TM's and external ear canals both ears Nose: Nares normal. Septum midline. Mucosa normal. No drainage or sinus tenderness. Throat: lips, mucosa, and tongue normal; teeth and gums normal Neck: no adenopathy, no carotid bruit, no JVD, supple, symmetrical, trachea midline and thyroid not enlarged, symmetric, no tenderness/mass/nodules Back: symmetric, no curvature.  ROM normal. No CVA tenderness. Lungs: clear to auscultation bilaterally Breasts: gyn Heart: S1, S2 normal Abdomen: soft, non-tender; bowel sounds normal; no masses,  no organomegaly Pelvic: deferred Extremities: extremities normal, atraumatic, no cyanosis or edema and - Pulses: 2+ and symmetric Skin: Skin color, texture, turgor normal. No rashes or lesions Lymph nodes: Cervical, supraclavicular, and axillary nodes normal. Neurologic: Alert and oriented X 3, normal strength and tone. Normal symmetric reflexes. Normal coordination and gait    Assessment:    Healthy female exam.      Plan:    ghm utd Check labs See After Visit Summary for Counseling Recommendations    1. Preventative health care See above  - CBC with Differential/Platelet - Comprehensive metabolic panel - Lipid panel - TSH - Ambulatory referral to Gastroenterology  2. Need for hepatitis C screening test   - Hepatitis C antibody  3. H/O aortic valve replacement  On coumadin Per cardiology

## 2017-08-18 NOTE — Patient Instructions (Signed)
Preventive Care 40-64 Years, Female Preventive care refers to lifestyle choices and visits with your health care provider that can promote health and wellness. What does preventive care include?  A yearly physical exam. This is also called an annual well check.  Dental exams once or twice a year.  Routine eye exams. Ask your health care provider how often you should have your eyes checked.  Personal lifestyle choices, including: ? Daily care of your teeth and gums. ? Regular physical activity. ? Eating a healthy diet. ? Avoiding tobacco and drug use. ? Limiting alcohol use. ? Practicing safe sex. ? Taking low-dose aspirin daily starting at age 14. ? Taking vitamin and mineral supplements as recommended by your health care provider. What happens during an annual well check? The services and screenings done by your health care provider during your annual well check will depend on your age, overall health, lifestyle risk factors, and family history of disease. Counseling Your health care provider may ask you questions about your:  Alcohol use.  Tobacco use.  Drug use.  Emotional well-being.  Home and relationship well-being.  Sexual activity.  Eating habits.  Work and work Statistician.  Method of birth control.  Menstrual cycle.  Pregnancy history.  Screening You may have the following tests or measurements:  Height, weight, and BMI.  Blood pressure.  Lipid and cholesterol levels. These may be checked every 5 years, or more frequently if you are over 56 years old.  Skin check.  Lung cancer screening. You may have this screening every year starting at age 56 if you have a 30-pack-year history of smoking and currently smoke or have quit within the past 15 years.  Fecal occult blood test (FOBT) of the stool. You may have this test every year starting at age 56.  Flexible sigmoidoscopy or colonoscopy. You may have a sigmoidoscopy every 5 years or a colonoscopy  every 10 years starting at age 56.  Hepatitis C blood test.  Hepatitis B blood test.  Sexually transmitted disease (STD) testing.  Diabetes screening. This is done by checking your blood sugar (glucose) after you have not eaten for a while (fasting). You may have this done every 1-3 years.  Mammogram. This may be done every 1-2 years. Talk to your health care provider about when you should start having regular mammograms. This may depend on whether you have a family history of breast cancer.  BRCA-related cancer screening. This may be done if you have a family history of breast, ovarian, tubal, or peritoneal cancers.  Pelvic exam and Pap test. This may be done every 3 years starting at age 56. Starting at age 16, this may be done every 5 years if you have a Pap test in combination with an HPV test.  Bone density scan. This is done to screen for osteoporosis. You may have this scan if you are at high risk for osteoporosis.  Discuss your test results, treatment options, and if necessary, the need for more tests with your health care provider. Vaccines Your health care provider may recommend certain vaccines, such as:  Influenza vaccine. This is recommended every year.  Tetanus, diphtheria, and acellular pertussis (Tdap, Td) vaccine. You may need a Td booster every 10 years.  Varicella vaccine. You may need this if you have not been vaccinated.  Zoster vaccine. You may need this after age 56.  Measles, mumps, and rubella (MMR) vaccine. You may need at least one dose of MMR if you were born in  1957 or later. You may also need a second dose.  Pneumococcal 13-valent conjugate (PCV13) vaccine. You may need this if you have certain conditions and were not previously vaccinated.  Pneumococcal polysaccharide (PPSV23) vaccine. You may need one or two doses if you smoke cigarettes or if you have certain conditions.  Meningococcal vaccine. You may need this if you have certain  conditions.  Hepatitis A vaccine. You may need this if you have certain conditions or if you travel or work in places where you may be exposed to hepatitis A.  Hepatitis B vaccine. You may need this if you have certain conditions or if you travel or work in places where you may be exposed to hepatitis B.  Haemophilus influenzae type b (Hib) vaccine. You may need this if you have certain conditions.  Talk to your health care provider about which screenings and vaccines you need and how often you need them. This information is not intended to replace advice given to you by your health care provider. Make sure you discuss any questions you have with your health care provider. Document Released: 05/30/2015 Document Revised: 01/21/2016 Document Reviewed: 03/04/2015 Elsevier Interactive Patient Education  2018 Elsevier Inc.  

## 2017-08-19 ENCOUNTER — Ambulatory Visit (INDEPENDENT_AMBULATORY_CARE_PROVIDER_SITE_OTHER): Payer: 59 | Admitting: *Deleted

## 2017-08-19 DIAGNOSIS — Z952 Presence of prosthetic heart valve: Secondary | ICD-10-CM

## 2017-08-19 DIAGNOSIS — Z7901 Long term (current) use of anticoagulants: Secondary | ICD-10-CM | POA: Diagnosis not present

## 2017-08-19 LAB — HEPATITIS C ANTIBODY
Hepatitis C Ab: NONREACTIVE
SIGNAL TO CUT-OFF: 0.15 (ref <1.00)

## 2017-08-19 LAB — POCT INR: INR: 1.4

## 2017-08-19 NOTE — Patient Instructions (Signed)
Description   Today and tomorrow take 1.5 tablets then start taking 1 tablet daily.   Recheck in 10 days at patient will be out of town all of next week. Coumadin Clinic [phone]

## 2017-08-23 ENCOUNTER — Other Ambulatory Visit: Payer: Self-pay | Admitting: *Deleted

## 2017-08-23 MED ORDER — COUMADIN 7.5 MG PO TABS: ORAL_TABLET | ORAL | 1 refills | Status: DC

## 2017-08-23 NOTE — Telephone Encounter (Signed)
Pharmacy requesting a ninety day rx. 

## 2017-08-24 ENCOUNTER — Other Ambulatory Visit: Payer: Self-pay | Admitting: *Deleted

## 2017-08-24 ENCOUNTER — Encounter: Payer: Self-pay | Admitting: *Deleted

## 2017-08-24 DIAGNOSIS — E785 Hyperlipidemia, unspecified: Secondary | ICD-10-CM

## 2017-08-29 ENCOUNTER — Ambulatory Visit (INDEPENDENT_AMBULATORY_CARE_PROVIDER_SITE_OTHER): Payer: 59 | Admitting: *Deleted

## 2017-08-29 DIAGNOSIS — Z952 Presence of prosthetic heart valve: Secondary | ICD-10-CM

## 2017-08-29 DIAGNOSIS — Z7901 Long term (current) use of anticoagulants: Secondary | ICD-10-CM

## 2017-08-29 DIAGNOSIS — Z5181 Encounter for therapeutic drug level monitoring: Secondary | ICD-10-CM

## 2017-08-29 LAB — POCT INR: INR: 1.7

## 2017-08-29 NOTE — Patient Instructions (Signed)
Description   Today April 15th take 1 and 1/2 tablets (11.25mg ) tomorrow April 16th take 1 and 1/2 tablets (11.25mg )  then change coumadin dose to  1 tablet  (7.5mg ) daily.except 1 and 1/2 tablets (11.25mg ) on Saturdays    Recheck in  2 weeks  Call  Clinic [phone] with any concerns

## 2017-09-09 ENCOUNTER — Ambulatory Visit (INDEPENDENT_AMBULATORY_CARE_PROVIDER_SITE_OTHER): Payer: 59 | Admitting: *Deleted

## 2017-09-09 DIAGNOSIS — Z952 Presence of prosthetic heart valve: Secondary | ICD-10-CM | POA: Diagnosis not present

## 2017-09-09 DIAGNOSIS — Z7901 Long term (current) use of anticoagulants: Secondary | ICD-10-CM | POA: Diagnosis not present

## 2017-09-09 DIAGNOSIS — Z5181 Encounter for therapeutic drug level monitoring: Secondary | ICD-10-CM | POA: Diagnosis not present

## 2017-09-09 LAB — POCT INR: INR: 2

## 2017-09-09 MED ORDER — COUMADIN 7.5 MG PO TABS: ORAL_TABLET | ORAL | 0 refills | Status: DC

## 2017-09-09 NOTE — Patient Instructions (Signed)
Description   Today April 26th take 1 and 1/2 tablets (11.25mg )  Then change coumadin dose to   1 tablet  (7.5mg ) daily.except 1 and 1/2 tablets (11.25mg ) on  Tuesdays and  Saturdays    Recheck in  2 weeks  Call  Clinic [phone] with any concerns

## 2017-09-27 ENCOUNTER — Ambulatory Visit (INDEPENDENT_AMBULATORY_CARE_PROVIDER_SITE_OTHER): Payer: 59 | Admitting: *Deleted

## 2017-09-27 DIAGNOSIS — Z5181 Encounter for therapeutic drug level monitoring: Secondary | ICD-10-CM

## 2017-09-27 DIAGNOSIS — Z952 Presence of prosthetic heart valve: Secondary | ICD-10-CM | POA: Diagnosis not present

## 2017-09-27 DIAGNOSIS — Z7901 Long term (current) use of anticoagulants: Secondary | ICD-10-CM

## 2017-09-27 LAB — POCT INR: INR: 2.4

## 2017-09-27 NOTE — Patient Instructions (Signed)
Description   Today May 14th  take 2  tablets (15mg )  Then change coumadin dose to   1 tablet  (7.5mg ) daily.except 1 and 1/2 tablets (11.25mg ) on Sundays ,  Tuesdays and  Fridays    Recheck in  2 weeks  Call  Clinic [phone] with any concerns

## 2017-10-11 ENCOUNTER — Encounter

## 2017-10-11 ENCOUNTER — Ambulatory Visit: Payer: 59 | Admitting: Obstetrics & Gynecology

## 2017-10-13 ENCOUNTER — Ambulatory Visit (INDEPENDENT_AMBULATORY_CARE_PROVIDER_SITE_OTHER): Payer: 59 | Admitting: Pharmacist

## 2017-10-13 DIAGNOSIS — Z7901 Long term (current) use of anticoagulants: Secondary | ICD-10-CM | POA: Diagnosis not present

## 2017-10-13 DIAGNOSIS — Z952 Presence of prosthetic heart valve: Secondary | ICD-10-CM

## 2017-10-13 LAB — POCT INR: INR: 4.4 — AB (ref 2.0–3.0)

## 2017-10-13 NOTE — Patient Instructions (Signed)
Description   No warfarin today then continue 1 tablet  (7.5mg ) daily.except 1 and 1/2 tablets (11.25mg ) on Sundays ,  Tuesdays and  Fridays    Recheck in  2 weeks  Call  Clinic [phone] with any concerns

## 2017-10-25 ENCOUNTER — Ambulatory Visit (HOSPITAL_COMMUNITY): Payer: 59 | Attending: Cardiovascular Disease

## 2017-10-25 ENCOUNTER — Other Ambulatory Visit: Payer: Self-pay

## 2017-10-25 ENCOUNTER — Ambulatory Visit (INDEPENDENT_AMBULATORY_CARE_PROVIDER_SITE_OTHER): Payer: 59 | Admitting: *Deleted

## 2017-10-25 DIAGNOSIS — Z952 Presence of prosthetic heart valve: Secondary | ICD-10-CM | POA: Diagnosis present

## 2017-10-25 DIAGNOSIS — Z5181 Encounter for therapeutic drug level monitoring: Secondary | ICD-10-CM

## 2017-10-25 DIAGNOSIS — I080 Rheumatic disorders of both mitral and aortic valves: Secondary | ICD-10-CM | POA: Insufficient documentation

## 2017-10-25 DIAGNOSIS — R0789 Other chest pain: Secondary | ICD-10-CM | POA: Diagnosis not present

## 2017-10-25 DIAGNOSIS — Z7901 Long term (current) use of anticoagulants: Secondary | ICD-10-CM

## 2017-10-25 DIAGNOSIS — I359 Nonrheumatic aortic valve disorder, unspecified: Secondary | ICD-10-CM | POA: Diagnosis not present

## 2017-10-25 LAB — POCT INR: INR: 4.8 — AB (ref 2.0–3.0)

## 2017-10-25 NOTE — Patient Instructions (Signed)
Description   Do not take warfarin today June 11th then tomorrow June 12th take only 1/2 tablet then  continue 1 tablet  (7.5mg ) daily.except 1 and 1/2 tablets (11.25mg ) on Sundays ,  Tuesdays and  Fridays    Recheck in  2 weeks  Call  Clinic [phone] with any concerns

## 2017-10-27 ENCOUNTER — Telehealth: Payer: Self-pay | Admitting: Cardiovascular Disease

## 2017-10-27 NOTE — Telephone Encounter (Signed)
Patient aware of echo results. Patient is concerned about the pain she is feeling in her chest. Patient stated most of the pain is at the left axillary area that goes down under the left breast. Patient stated the pain comes and goes with or without activity. Patient stated it feels similar to what she felt before her surgery in 2001. Patient is concerned about her mechanical aortic valve, because it's suppose to last for 20 years and she is afraid that it's getting close to that time. Will send message to Dr. Johnsie Cancel.

## 2017-10-27 NOTE — Telephone Encounter (Signed)
Follow Up: ° ° ° °Returning your call from yesterday. °

## 2017-10-27 NOTE — Telephone Encounter (Signed)
Left message for patient to call back  

## 2017-11-03 ENCOUNTER — Telehealth: Payer: Self-pay

## 2017-11-03 DIAGNOSIS — R079 Chest pain, unspecified: Secondary | ICD-10-CM

## 2017-11-03 NOTE — Progress Notes (Signed)
Not sure a monitor is appropriate for chest pain

## 2017-11-03 NOTE — Telephone Encounter (Signed)
Patient has agreed to have ETT. Patient given instructions. Will have [facility] call patient with an appointment.

## 2017-11-08 ENCOUNTER — Ambulatory Visit (INDEPENDENT_AMBULATORY_CARE_PROVIDER_SITE_OTHER): Payer: 59 | Admitting: *Deleted

## 2017-11-08 DIAGNOSIS — Z7901 Long term (current) use of anticoagulants: Secondary | ICD-10-CM

## 2017-11-08 DIAGNOSIS — Z952 Presence of prosthetic heart valve: Secondary | ICD-10-CM

## 2017-11-08 DIAGNOSIS — Z5181 Encounter for therapeutic drug level monitoring: Secondary | ICD-10-CM | POA: Diagnosis not present

## 2017-11-08 LAB — POCT INR: INR: 3.7 — AB (ref 2.0–3.0)

## 2017-11-08 NOTE — Patient Instructions (Signed)
Description   Do not take warfarin today June 25th then  continue 1 tablet  (7.5mg ) daily.except 1 and 1/2 tablets (11.25mg ) on Sundays ,  Tuesdays and  Fridays    Recheck in  2 weeks  Call  Clinic [phone] with any concerns Pt states has been on vacation and has not eaten greens as usual and did not want dose changed at this time

## 2017-11-21 ENCOUNTER — Encounter: Payer: Self-pay | Admitting: Family Medicine

## 2017-11-24 ENCOUNTER — Ambulatory Visit (INDEPENDENT_AMBULATORY_CARE_PROVIDER_SITE_OTHER): Payer: 59 | Admitting: Pharmacist

## 2017-11-24 DIAGNOSIS — Z952 Presence of prosthetic heart valve: Secondary | ICD-10-CM

## 2017-11-24 DIAGNOSIS — Z7901 Long term (current) use of anticoagulants: Secondary | ICD-10-CM

## 2017-11-24 LAB — POCT INR: INR: 2.9 (ref 2.0–3.0)

## 2017-11-24 NOTE — Patient Instructions (Signed)
Description   Continue taking 1 tablet daily except 1.5 on Sundays, Tuesdays, and Fridays. Recheck in 3 weeks.  Call clinic [phone] with any concerns.

## 2017-12-03 ENCOUNTER — Other Ambulatory Visit: Payer: Self-pay | Admitting: Cardiovascular Disease

## 2017-12-13 ENCOUNTER — Encounter: Payer: Self-pay | Admitting: Obstetrics & Gynecology

## 2017-12-13 ENCOUNTER — Other Ambulatory Visit: Payer: Self-pay

## 2017-12-13 ENCOUNTER — Ambulatory Visit (INDEPENDENT_AMBULATORY_CARE_PROVIDER_SITE_OTHER): Payer: 59 | Admitting: Obstetrics & Gynecology

## 2017-12-13 VITALS — BP 136/84 | HR 72 | Resp 16 | Ht 60.5 in | Wt 144.2 lb

## 2017-12-13 DIAGNOSIS — Z01419 Encounter for gynecological examination (general) (routine) without abnormal findings: Secondary | ICD-10-CM

## 2017-12-13 NOTE — Progress Notes (Signed)
57 y.o. G2P2 SingleCaucasianF here for annual exam.  Doing well.  Denies vaginal bleeding.  Had echo 10/25/17.  Having some chest pressure/pain under her left arm.  Exercise tolerance test was recommended.  Having fatigue issues.  D/w pt blood pressure today.   Blood work done in April with Dr. Carollee Herter.  Patient's last menstrual period was 05/18/2003.          Sexually active: Yes.    The current method of family planning is post menopausal status.    Exercising: No.   Smoker:  no  Health Maintenance: Pap:  07/19/16 neg. HR HPV:neg   01/08/14 Neg  History of abnormal Pap:  yes MMG:  11/04/15 BIRADS1:Neg.  Aware this is overdue. Colonoscopy:  11/22/13 Polyps. F/u 5 years  BMD:   09/25/14 Osteoporosis  TDaP:  10/2009 Pneumonia vaccine(s):  No Shingrix:   No Hep C testing: 08/18/17 neg   Screening Labs: PCP   reports that she has never smoked. She has never used smokeless tobacco. She reports that she drinks about 0.6 - 1.2 oz of alcohol per week. She reports that she does not use drugs.  Past Medical History:  Diagnosis Date  . Anemia   . Aortic stenosis     by Dr. Prescott Gum  . CHEST PAIN UNSPECIFIED    Qualifier: Diagnosis of  By: Burnett Harry MD, Shanon Brow    . Colon polyp   . Costochondritis    x2  . Diverticulosis   . IBS (irritable bowel syndrome)   . Long term current use of anticoagulant   . Migraine   . OAB (overactive bladder)   . Osteopenia   . Osteoporosis   . Rash   . S/P AVR    2001 for bicuspid AV.....Marland KitchenST JUDE  . Shingles    ? diag/rash 11/21/12  . Shortness of breath    Qualifier: Diagnosis of  By: Allean Found, LPN, [name]    . Vulvar ulceration    recurrent    Past Surgical History:  Procedure Laterality Date  . AORTIC VALVE REPLACEMENT  2001   [employer]  . APPENDECTOMY     post op abdominal wall hematoma/2 transfusions  . BREAST BIOPSY  5/08   left breast  . CESAREAN SECTION      Current Outpatient Medications  Medication Sig Dispense Refill  .  Calcium-Magnesium-Vitamin D (CITRACAL SLOW RELEASE PO) Take 2 capsules by mouth daily. 1200mg s + D3, Slow Release    . COUMADIN 7.5 MG tablet TAKE AS DIRECTED BY COUMADIN CLINIC 120 tablet 0  . FIBER PO Take 1 tablet by mouth daily. Gummies    . Multiple Vitamins-Minerals (MULTIVITAMIN GUMMIES ADULTS PO) Take 2 tablets by mouth daily. GUMMIES    . omega-3 acid ethyl esters (LOVAZA) 1 G capsule Take 1 g by mouth daily.     Marland Kitchen amoxicillin (AMOXIL) 500 MG capsule as directed.  1   No current facility-administered medications for this visit.     Family History  Problem Relation Age of Onset  . Hypertension Mother   . Diabetes Mother   . Hyperlipidemia Mother   . Diabetes Brother   . Hyperlipidemia Brother   . Hypertension Brother   . Diabetes Son   . Alzheimer's disease Maternal Grandfather   . Diabetes Maternal Grandfather     Review of Systems  Cardiovascular:       Heart murmur   Gastrointestinal: Positive for diarrhea.       Change in quality of stools  Genitourinary: Positive for urgency.  Endo/Heme/Allergies: Bruises/bleeds easily.  All other systems reviewed and are negative.   Exam:   BP 136/84 (BP Location: Left Arm, Patient Position: Sitting, Cuff Size: Large)   Pulse 72   Resp 16   Ht 5' 0.5" (1.537 m)   Wt 144 lb 3.2 oz (65.4 kg)   LMP 05/18/2003   BMI 27.70 kg/m    Height: 5' 0.5" (153.7 cm)  Ht Readings from Last 3 Encounters:  12/13/17 5' 0.5" (1.537 m)  08/18/17 5\' 1"  (1.549 m)  07/19/17 5\' 1"  (1.549 m)    General appearance: alert, cooperative and appears stated age Head: Normocephalic, without obvious abnormality, atraumatic Neck: no adenopathy, supple, symmetrical, trachea midline and thyroid normal to inspection and palpation Lungs: clear to auscultation bilaterally Breasts: normal appearance, no masses or tenderness Heart: regular rate and rhythm Abdomen: soft, non-tender; bowel sounds normal; no masses,  no organomegaly Extremities: extremities  normal, atraumatic, no cyanosis or edema Skin: Skin color, texture, turgor normal. No rashes or lesions Lymph nodes: Cervical, supraclavicular, and axillary nodes normal. No abnormal inguinal nodes palpated Neurologic: Grossly normal   Pelvic: External genitalia:  no lesions              Urethra:  normal appearing urethra with no masses, tenderness or lesions              Bartholins and Skenes: normal                 Vagina: normal appearing vagina with normal color and discharge, no lesions              Cervix: no lesions              Pap taken: No. Bimanual Exam:  Uterus:  normal size, contour, position, consistency, mobility, non-tender              Adnexa: normal adnexa and no mass, fullness, tenderness               Rectovaginal: Confirms               Anus:  normal sphincter tone, no lesions  Chaperone was present for exam.  A:  Well Woman with normal exam PMP, no HRT Osteoporosis.  Has declined treatment. OAB H/O diverticulosis Mildly elevated BP  P:   Mammogram guidelines reviewed.  Will schedule this for pt with BMD Pt is going to monitor her BP and watch salt intake.  Aware to let me know if increases further. pap smear not indicated.  Pap and HR HPV done 2018. Colonoscopy due next year Lab work UTD Information about Shingrix vaccination given. Return annually or prn

## 2017-12-13 NOTE — Patient Instructions (Signed)
[facility] Phone: [phone] Location: Lower level of [facility] ([address]) Hours: 7:30 a.m. to 6:00 p.m., Monday through Friday.   [facility] Phone: [phone] Location: [address] Hours: 7:30 a.m. to 6:00 p.m., Monday through Friday.  

## 2017-12-15 ENCOUNTER — Ambulatory Visit: Payer: 59 | Admitting: Cardiovascular Disease

## 2017-12-15 ENCOUNTER — Telehealth: Payer: Self-pay | Admitting: *Deleted

## 2017-12-15 ENCOUNTER — Other Ambulatory Visit: Payer: Self-pay | Admitting: Obstetrics & Gynecology

## 2017-12-15 DIAGNOSIS — E2839 Other primary ovarian failure: Secondary | ICD-10-CM

## 2017-12-15 DIAGNOSIS — M810 Age-related osteoporosis without current pathological fracture: Secondary | ICD-10-CM

## 2017-12-15 DIAGNOSIS — Z1231 Encounter for screening mammogram for malignant neoplasm of breast: Secondary | ICD-10-CM

## 2017-12-15 NOTE — Telephone Encounter (Signed)
Left voicemail for patient re: MMG and BMD appt  [facility] for Friday 01/27/18 Arrival time 7:40am BMD appt 8:00am MMG appt 8:30am  Phone # and address to [facility] provided. Informed to wear no lotions, powder or deodorants and to stop Multi vit and calcium 2 days prior to appt.  Encounter closed

## 2017-12-25 ENCOUNTER — Other Ambulatory Visit: Payer: Self-pay | Admitting: Cardiovascular Disease

## 2017-12-27 ENCOUNTER — Ambulatory Visit (INDEPENDENT_AMBULATORY_CARE_PROVIDER_SITE_OTHER): Payer: 59

## 2017-12-27 DIAGNOSIS — Z7901 Long term (current) use of anticoagulants: Secondary | ICD-10-CM | POA: Diagnosis not present

## 2017-12-27 DIAGNOSIS — Z952 Presence of prosthetic heart valve: Secondary | ICD-10-CM | POA: Diagnosis not present

## 2017-12-27 LAB — POCT INR: INR: 4.5 — AB (ref 2.0–3.0)

## 2017-12-27 NOTE — Patient Instructions (Signed)
Description   Skip today's dosage of Coumadin, then resume same dosage 1 tablet daily except 1.5 on Sundays, Tuesdays, and Fridays. Recheck in 2 weeks.  Call clinic [phone] with any concerns.

## 2018-01-18 ENCOUNTER — Ambulatory Visit: Payer: 59 | Admitting: Pharmacist

## 2018-01-18 DIAGNOSIS — Z7901 Long term (current) use of anticoagulants: Secondary | ICD-10-CM | POA: Diagnosis not present

## 2018-01-18 DIAGNOSIS — Z952 Presence of prosthetic heart valve: Secondary | ICD-10-CM

## 2018-01-18 LAB — POCT INR: INR: 3.2 — AB (ref 2.0–3.0)

## 2018-01-18 NOTE — Patient Instructions (Signed)
Description   Continue same dosage 1 tablet daily except 1.5 on Sundays, Tuesdays, and Fridays. Recheck in 3 weeks.  Call clinic [phone] with any concerns.

## 2018-01-27 ENCOUNTER — Other Ambulatory Visit: Payer: 59

## 2018-01-27 ENCOUNTER — Ambulatory Visit: Payer: 59

## 2018-02-03 ENCOUNTER — Encounter: Payer: Self-pay | Admitting: Cardiovascular Disease

## 2018-02-08 ENCOUNTER — Ambulatory Visit (INDEPENDENT_AMBULATORY_CARE_PROVIDER_SITE_OTHER): Payer: 59 | Admitting: *Deleted

## 2018-02-08 DIAGNOSIS — Z7901 Long term (current) use of anticoagulants: Secondary | ICD-10-CM | POA: Diagnosis not present

## 2018-02-08 DIAGNOSIS — Z952 Presence of prosthetic heart valve: Secondary | ICD-10-CM

## 2018-02-08 LAB — POCT INR: INR: 3.6 — AB (ref 2.0–3.0)

## 2018-02-08 NOTE — Patient Instructions (Addendum)
Description   Today take 1/2 tablet then continue same dosage 1 tablet daily except 1.5 tablets on Sundays, Tuesdays, and Fridays. Recheck in 3 weeks.  Call clinic [phone] with any concerns.

## 2018-03-10 ENCOUNTER — Ambulatory Visit (INDEPENDENT_AMBULATORY_CARE_PROVIDER_SITE_OTHER): Payer: 59 | Admitting: *Deleted

## 2018-03-10 DIAGNOSIS — Z7901 Long term (current) use of anticoagulants: Secondary | ICD-10-CM

## 2018-03-10 DIAGNOSIS — Z952 Presence of prosthetic heart valve: Secondary | ICD-10-CM

## 2018-03-10 LAB — POCT INR: INR: 4.1 — AB (ref 2.0–3.0)

## 2018-03-10 NOTE — Patient Instructions (Signed)
Description   Do not take any Coumadin today then continue same dosage 1 tablet daily except 1.5 tablets on Sundays, Tuesdays, and Fridays. Recheck in 3 weeks.  Call clinic [phone] with any concerns.

## 2018-03-24 ENCOUNTER — Other Ambulatory Visit: Payer: Self-pay | Admitting: Cardiovascular Disease

## 2018-04-07 ENCOUNTER — Ambulatory Visit (INDEPENDENT_AMBULATORY_CARE_PROVIDER_SITE_OTHER): Payer: 59 | Admitting: *Deleted

## 2018-04-07 DIAGNOSIS — Z952 Presence of prosthetic heart valve: Secondary | ICD-10-CM | POA: Diagnosis not present

## 2018-04-07 DIAGNOSIS — Z7901 Long term (current) use of anticoagulants: Secondary | ICD-10-CM | POA: Diagnosis not present

## 2018-04-07 LAB — POCT INR: INR: 5.5 — AB (ref 2.0–3.0)

## 2018-04-07 NOTE — Patient Instructions (Signed)
Description   Skip today and tomorrow's dose,  then start taking 1 tablet daily except 1.5 tablets on Tuesdays.  Recheck in 12 days. Call clinic [phone] with any concerns.

## 2018-04-10 ENCOUNTER — Other Ambulatory Visit: Payer: Self-pay | Admitting: Cardiovascular Disease

## 2018-04-17 ENCOUNTER — Other Ambulatory Visit: Payer: Self-pay | Admitting: Cardiovascular Disease

## 2018-04-17 MED ORDER — COUMADIN 7.5 MG PO TABS: ORAL_TABLET | ORAL | 1 refills | Status: DC

## 2018-04-17 NOTE — Telephone Encounter (Signed)
Coumadin name brand rx sent to pharmacy as requested.  Per pt call pt needs prior auth for name brand Coumadin sent to insurance to cover rx.  Will forward message to prior auth department to address.

## 2018-04-17 NOTE — Telephone Encounter (Signed)
 *  STAT* If patient is at the pharmacy, call can be transferred to refill team.   1. Which medications need to be refilled? (please list name of each medication and dose if known)  COUMADIN 7.5 MG tablet  2. Which pharmacy/location (including street and city if local pharmacy) is medication to be sent to? [facility]  3. Do they need a 30 day or 90 day supply? 120 tablets  Patient needs an authorization sent to insurance because they won't cover her Coumadin without it.

## 2018-04-18 NOTE — Telephone Encounter (Signed)
**Note De-Identified Vanessa Owens Obfuscation** Unclear why the pt is taking name brand Coumadin? Please advise so I can do a PA. Thanks.

## 2018-04-18 NOTE — Telephone Encounter (Signed)
Pt was on Warfarin prior to taking Coumadin and while on Warfarin INR's pt states INR's were subtherapeutic (low) and she was switched to brand May 27, 2014. RX for Coumadin was sent in 05/27/2014 and has been since then. Thanks.

## 2018-04-18 NOTE — Telephone Encounter (Signed)
I have done a PA on the pts Brand name Coumadin. Key: [id number]

## 2018-04-19 ENCOUNTER — Ambulatory Visit (INDEPENDENT_AMBULATORY_CARE_PROVIDER_SITE_OTHER): Payer: 59 | Admitting: *Deleted

## 2018-04-19 ENCOUNTER — Other Ambulatory Visit: Payer: Self-pay | Admitting: *Deleted

## 2018-04-19 DIAGNOSIS — Z7901 Long term (current) use of anticoagulants: Secondary | ICD-10-CM

## 2018-04-19 DIAGNOSIS — Z952 Presence of prosthetic heart valve: Secondary | ICD-10-CM

## 2018-04-19 LAB — POCT INR: INR: 2.1 (ref 2.0–3.0)

## 2018-04-19 MED ORDER — WARFARIN SODIUM 7.5 MG PO TABS: 7.5000 mg | ORAL_TABLET | ORAL | 1 refills | Status: DC

## 2018-04-19 NOTE — Patient Instructions (Signed)
Description   Today take 1.5 tablets then continue taking 1 tablet daily except 1.5 tablets on Tuesdays.  Recheck in 2 weeks. Call clinic [phone] with any concerns.

## 2018-04-19 NOTE — Telephone Encounter (Signed)
I left a detailed message on the pts VM (she did identify herself on the VM greeting) stating that [employer] denied her name brand Coumadin PA. Reason: The clinical and/or treatment information provided did not meet medical necessity based on the terms of your benefit plan and [employer]'s pharmacy Clinical policy bulletin.  In the message that I left on the pts VM I did advise her that [facility] [place] [facility] be [facility].

## 2018-04-19 NOTE — Telephone Encounter (Addendum)
**Note De-Identified [name] Obfuscation** The pt called to ask if I have done her name brand Coumadin PA yet. I advised her that I did do it yesterday and that I have not received a determination yet as it can take up to 72 hours.  I did let her know that it is unlikely that I will be able to get an approval on her name brand Coumadin due to her non therapeutic INR values throughout 2019.  She asked a lot of questions and became tearful as she states that our records are not correct. She states that she has never taken Warfarin as long as she has been seeing Dr Johnsie Cancel and that Warfarin was changed to name brand Coumadin by Dr [name] prior to her seeing Dr Johnsie Cancel. She states that all of her records were sent to [place] from Dr Don Broach office and that we have not updated her chart.  I have gone through her chart at length between yesterday and today doing the PA on her Coumadin and it looks like she was on Warfarin while seeing Dr Johnsie Cancel but there is no note to indicate why she was switched to name brand Coumadin in January of 2016.  The pt states that she was switched to name brand Coumadin because she had subtherapeutic INR readings while taking Warfarin. I read through her INR readings with her for the year 2019 and she agrees that name brand Coumadin is nontherapeutic as well but states that it is because she has been on the KETO diet.  She is in agreement with trying Warfarin again and wants to be sure that there is correct documentation in her chart from here on as she does not think Warfarin is going to work for her and she wants documentation of all of her calls so if a PA is required in the future we will have documentation.  I have assured her that we will make note of any and all calls she makes to this office.  She thanked me for trying to get her Coumadin PA approved.    Will forward to the Coumadin Clinic to send in her Warfarin refill as she states that she will be out by this Friday. Please call the pt to let her know refill  has been sent.

## 2018-04-24 ENCOUNTER — Ambulatory Visit: Payer: 59 | Admitting: Obstetrics & Gynecology

## 2018-04-24 ENCOUNTER — Encounter

## 2018-05-01 ENCOUNTER — Ambulatory Visit (INDEPENDENT_AMBULATORY_CARE_PROVIDER_SITE_OTHER): Payer: 59 | Admitting: *Deleted

## 2018-05-01 DIAGNOSIS — Z7901 Long term (current) use of anticoagulants: Secondary | ICD-10-CM

## 2018-05-01 DIAGNOSIS — Z952 Presence of prosthetic heart valve: Secondary | ICD-10-CM

## 2018-05-01 LAB — POCT INR: INR: 2.9 (ref 2.0–3.0)

## 2018-05-01 NOTE — Patient Instructions (Signed)
Description   Continue taking 1 tablet daily except 1.5 tablets on Tuesdays.  Recheck in 3 weeks. Call clinic [phone] with any concerns.

## 2018-05-31 ENCOUNTER — Ambulatory Visit (INDEPENDENT_AMBULATORY_CARE_PROVIDER_SITE_OTHER): Payer: 59 | Admitting: *Deleted

## 2018-05-31 DIAGNOSIS — Z7901 Long term (current) use of anticoagulants: Secondary | ICD-10-CM

## 2018-05-31 DIAGNOSIS — Z952 Presence of prosthetic heart valve: Secondary | ICD-10-CM

## 2018-05-31 LAB — POCT INR: INR: 2.8 (ref 2.0–3.0)

## 2018-05-31 NOTE — Patient Instructions (Signed)
Description   Continue taking 1 tablet daily except 1.5 tablets on Tuesdays.  Recheck in 4 weeks. Call clinic [phone] with any concerns.

## 2018-06-27 ENCOUNTER — Ambulatory Visit (INDEPENDENT_AMBULATORY_CARE_PROVIDER_SITE_OTHER): Payer: 59

## 2018-06-27 DIAGNOSIS — Z952 Presence of prosthetic heart valve: Secondary | ICD-10-CM

## 2018-06-27 DIAGNOSIS — Z7901 Long term (current) use of anticoagulants: Secondary | ICD-10-CM | POA: Diagnosis not present

## 2018-06-27 LAB — POCT INR: INR: 3.4 — AB (ref 2.0–3.0)

## 2018-06-27 NOTE — Patient Instructions (Signed)
Continue taking 1 tablet daily except 1.5 tablets on Tuesdays.  Recheck in 5 weeks. Call clinic [phone] with any concerns.

## 2018-07-04 ENCOUNTER — Encounter: Payer: Self-pay | Admitting: Cardiovascular Disease

## 2018-07-20 NOTE — Progress Notes (Signed)
Patient ID: Vanessa Owens, female   DOB: Apr 10, 1961, 58 y.o.   MRN: [id number]     58 y.o. 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 On chronic coumadin Wants her f/u echo's at [facility] Due to cost.    New house in 2017  Working too much at [employer] center and [employer]  grand baby in [place] that she misses  07/15/16 Atypical chest pain. F/U ETT 07/28/16 was normal   Had a tooth infection or something that caused facial swelling in October 2018 had SBE For crown and got antibiotics  TTE 10/25/17 EF 55-60% AVR mean gradient 21 mmHg peak 38 mmHg stable  DVI .44 mild MR    ROS: Denies fever, malais, weight loss, blurry vision, decreased visual acuity, cough, sputum, SOB, hemoptysis, pleuritic pain, palpitaitons, heartburn, abdominal pain, melena, lower extremity edema, claudication, or rash.  All other systems reviewed and negative  General: Vitals:   07/21/18 0901  BP: 126/74  Pulse: 67  SpO2: 98%   Affect appropriate Healthy:  appears stated age 58: normal Neck supple with no adenopathy JVP normal no bruits no thyromegaly Lungs clear with no wheezing and good diaphragmatic motion Heart:  O3/Z8 click mechanical valve SEM  No AR  murmur, no rub, gallop or click PMI normal Abdomen: benighn, BS positve, no tenderness, no AAA no bruit.  No HSM or HJR Distal pulses intact with no bruits No edema Neuro non-focal Skin warm and dry No muscular weakness   Current Outpatient Medications  Medication Sig Dispense Refill  . amoxicillin (AMOXIL) 500 MG capsule as directed.  1  . Calcium-Magnesium-Vitamin D (CITRACAL SLOW RELEASE PO) Take 2 capsules by mouth daily. 1200mg s + D3, Slow Release    . FIBER PO Take 1 tablet by mouth daily. Gummies    . Multiple Vitamins-Minerals (MULTIVITAMIN GUMMIES ADULTS PO) Take 2 tablets by mouth daily. GUMMIES    . warfarin (COUMADIN) 7.5 MG tablet Take 1 tablet (7.5 mg total) by mouth as directed. 120 tablet 1   No current  facility-administered medications for this visit.     Allergies  Morphine and Morphine and related  Electrocardiogram: 07/19/17 SR rate 66 normal   Assessment and Plan  AVR:  2001 for bicuspid valve mean gradient stable 10/25/17 no AR DVI .44  Continue SBE prophylaxis   LLQ Pain:  F/u [name] on viberzi  ? IBS  Anticoagulation no bleeding issues  Coumadin clinic 07/07/17 INR 3.4   Depression:  F/u Dr [name] could use SSRI  Chest Pain:  Resolved normal ETT 07/28/16 no need to repeat   [name]

## 2018-07-21 ENCOUNTER — Encounter: Payer: Self-pay | Admitting: Cardiovascular Disease

## 2018-07-21 ENCOUNTER — Ambulatory Visit (INDEPENDENT_AMBULATORY_CARE_PROVIDER_SITE_OTHER): Payer: 59 | Admitting: Cardiovascular Disease

## 2018-07-21 VITALS — BP 126/74 | HR 67 | Ht 60.5 in | Wt 152.1 lb

## 2018-07-21 DIAGNOSIS — I359 Nonrheumatic aortic valve disorder, unspecified: Secondary | ICD-10-CM | POA: Diagnosis not present

## 2018-07-21 NOTE — Patient Instructions (Signed)
Medication Instructions:   If you need a refill on your cardiac medications before your next appointment, please call your pharmacy.   Lab work:  If you have labs (blood work) drawn today and your tests are completely normal, you will receive your results only by: . MyChart Message (if you have MyChart) OR . A paper copy in the mail If you have any lab test that is abnormal or we need to change your treatment, we will call you to review the results.  Testing/Procedures: None ordered today.   Follow-Up: At [facility], you and your health needs are our priority.  As part of our continuing mission to provide you with exceptional heart care, we have created designated Provider Care Teams.  These Care Teams include your primary Cardiologist (physician) and Advanced Practice Providers (APPs -  Physician Assistants and Nurse Practitioners) who all work together to provide you with the care you need, when you need it. You will need a follow up appointment in 1 years.  Please call our office 2 months in advance to schedule this appointment.  You may see Dr. Nishan or one of the following Advanced Practice Providers on your designated Care Team:   Lori Gerhardt, NP Laura Ingold, NP . Jill McDaniel, NP  

## 2018-08-02 ENCOUNTER — Ambulatory Visit (INDEPENDENT_AMBULATORY_CARE_PROVIDER_SITE_OTHER): Payer: 59 | Admitting: *Deleted

## 2018-08-02 ENCOUNTER — Other Ambulatory Visit: Payer: Self-pay

## 2018-08-02 DIAGNOSIS — Z7901 Long term (current) use of anticoagulants: Secondary | ICD-10-CM

## 2018-08-02 DIAGNOSIS — Z952 Presence of prosthetic heart valve: Secondary | ICD-10-CM | POA: Diagnosis not present

## 2018-08-02 LAB — POCT INR: INR: 4.7 — AB (ref 2.0–3.0)

## 2018-08-02 NOTE — Patient Instructions (Addendum)
Description   Do not take any Coumadin today then continue taking 1 tablet daily except 1.5 tablets on Tuesdays.  Recheck in 3 weeks. Call clinic [phone] with any concerns.

## 2018-08-22 ENCOUNTER — Telehealth: Payer: Self-pay | Admitting: Family Medicine

## 2018-08-22 ENCOUNTER — Encounter: Payer: 59 | Admitting: Family Medicine

## 2018-08-22 NOTE — Telephone Encounter (Signed)
Copied from [place] [phone]. Topic: Appointment Scheduling - Scheduling Inquiry for Clinic >> Aug 22, 2018  8:23 AM Robina Ade, Helene Kelp D wrote: Reason for CRM: Patient called and had an appt today with Dr. [name] but it was cancelled. Patient would like to reschedule her CPE. Tried calling office but had no answer. Please call patient back, thanks.

## 2018-08-22 NOTE — Telephone Encounter (Signed)
When you get a chance to call her back to schedule CPE

## 2018-08-24 ENCOUNTER — Telehealth: Payer: Self-pay

## 2018-08-24 NOTE — Telephone Encounter (Signed)

## 2018-08-25 ENCOUNTER — Other Ambulatory Visit: Payer: Self-pay

## 2018-08-25 ENCOUNTER — Ambulatory Visit (INDEPENDENT_AMBULATORY_CARE_PROVIDER_SITE_OTHER): Payer: 59 | Admitting: Pharmacist Clinician (PhC)/ Clinical Pharmacy Specialist

## 2018-08-25 DIAGNOSIS — Z7901 Long term (current) use of anticoagulants: Secondary | ICD-10-CM

## 2018-08-25 DIAGNOSIS — Z952 Presence of prosthetic heart valve: Secondary | ICD-10-CM

## 2018-08-25 LAB — POCT INR: INR: 4.7 — AB (ref 2.0–3.0)

## 2018-09-14 ENCOUNTER — Telehealth: Payer: Self-pay

## 2018-09-14 NOTE — Telephone Encounter (Signed)
Number invalid.

## 2018-09-27 ENCOUNTER — Other Ambulatory Visit: Payer: Self-pay | Admitting: Cardiovascular Disease

## 2018-09-28 ENCOUNTER — Other Ambulatory Visit: Payer: Self-pay

## 2018-09-28 ENCOUNTER — Ambulatory Visit (INDEPENDENT_AMBULATORY_CARE_PROVIDER_SITE_OTHER): Payer: 59 | Admitting: *Deleted

## 2018-09-28 DIAGNOSIS — Z7901 Long term (current) use of anticoagulants: Secondary | ICD-10-CM | POA: Diagnosis not present

## 2018-09-28 DIAGNOSIS — Z952 Presence of prosthetic heart valve: Secondary | ICD-10-CM | POA: Diagnosis not present

## 2018-09-28 LAB — POCT INR: INR: 3.4 — AB (ref 2.0–3.0)

## 2018-10-18 ENCOUNTER — Telehealth: Payer: Self-pay | Admitting: *Deleted

## 2018-10-18 ENCOUNTER — Telehealth: Payer: Self-pay

## 2018-10-18 NOTE — Telephone Encounter (Signed)
LMOM TO MOVE APPT & PRESCREEN

## 2018-10-18 NOTE — Telephone Encounter (Signed)

## 2018-10-30 ENCOUNTER — Ambulatory Visit (INDEPENDENT_AMBULATORY_CARE_PROVIDER_SITE_OTHER): Payer: 59 | Admitting: *Deleted

## 2018-10-30 ENCOUNTER — Other Ambulatory Visit: Payer: Self-pay

## 2018-10-30 DIAGNOSIS — Z7901 Long term (current) use of anticoagulants: Secondary | ICD-10-CM | POA: Diagnosis not present

## 2018-10-30 DIAGNOSIS — Z952 Presence of prosthetic heart valve: Secondary | ICD-10-CM | POA: Diagnosis not present

## 2018-10-30 LAB — POCT INR: INR: 2.4 (ref 2.0–3.0)

## 2018-10-30 NOTE — Patient Instructions (Addendum)
  Description    Take 1.5 tablets today. Then continue taking 1 tablet daily. Recheck INR in 2-3 weeks.  Call clinic [phone] with any concerns.

## 2018-11-09 ENCOUNTER — Telehealth: Payer: Self-pay

## 2018-11-09 NOTE — Telephone Encounter (Signed)
lmom for prescreen  

## 2018-11-27 ENCOUNTER — Ambulatory Visit (INDEPENDENT_AMBULATORY_CARE_PROVIDER_SITE_OTHER): Payer: 59

## 2018-11-27 ENCOUNTER — Other Ambulatory Visit: Payer: Self-pay

## 2018-11-27 DIAGNOSIS — Z952 Presence of prosthetic heart valve: Secondary | ICD-10-CM | POA: Diagnosis not present

## 2018-11-27 DIAGNOSIS — Z7901 Long term (current) use of anticoagulants: Secondary | ICD-10-CM

## 2018-11-27 LAB — POCT INR: INR: 4.2 — AB (ref 2.0–3.0)

## 2018-11-27 NOTE — Patient Instructions (Signed)
Description   Skip today's dosage of Coumadin, then resume same dosage 1 tablet daily. Recheck INR in 3 weeks.  Call clinic [phone] with any concerns.

## 2018-12-25 ENCOUNTER — Ambulatory Visit (INDEPENDENT_AMBULATORY_CARE_PROVIDER_SITE_OTHER): Payer: 59 | Admitting: Pharmacist

## 2018-12-25 ENCOUNTER — Other Ambulatory Visit: Payer: Self-pay

## 2018-12-25 DIAGNOSIS — Z7901 Long term (current) use of anticoagulants: Secondary | ICD-10-CM | POA: Diagnosis not present

## 2018-12-25 DIAGNOSIS — Z952 Presence of prosthetic heart valve: Secondary | ICD-10-CM

## 2018-12-25 LAB — POCT INR: INR: 2.6 (ref 2.0–3.0)

## 2018-12-25 NOTE — Patient Instructions (Signed)
Description   Continue taking1 tablet daily. Recheck INR in 4 weeks.  Call clinic [phone] with any concerns.

## 2019-01-25 ENCOUNTER — Ambulatory Visit (INDEPENDENT_AMBULATORY_CARE_PROVIDER_SITE_OTHER): Payer: 59 | Admitting: *Deleted

## 2019-01-25 ENCOUNTER — Other Ambulatory Visit: Payer: Self-pay

## 2019-01-25 DIAGNOSIS — Z7901 Long term (current) use of anticoagulants: Secondary | ICD-10-CM | POA: Diagnosis not present

## 2019-01-25 DIAGNOSIS — Z952 Presence of prosthetic heart valve: Secondary | ICD-10-CM

## 2019-01-25 LAB — POCT INR: INR: 2.7 (ref 2.0–3.0)

## 2019-01-25 NOTE — Patient Instructions (Signed)
Description   Continue taking1 tablet daily. Recheck INR in 4 weeks.  Call clinic [phone] with any concerns.    

## 2019-02-23 ENCOUNTER — Other Ambulatory Visit: Payer: Self-pay

## 2019-02-23 ENCOUNTER — Ambulatory Visit (INDEPENDENT_AMBULATORY_CARE_PROVIDER_SITE_OTHER): Payer: 59

## 2019-02-23 DIAGNOSIS — Z7901 Long term (current) use of anticoagulants: Secondary | ICD-10-CM | POA: Diagnosis not present

## 2019-02-23 DIAGNOSIS — Z952 Presence of prosthetic heart valve: Secondary | ICD-10-CM

## 2019-02-23 LAB — POCT INR: INR: 2.2 (ref 2.0–3.0)

## 2019-02-23 NOTE — Patient Instructions (Signed)
Description   Take 1.5 tablets today, then resume same dosage 1 tablet daily. Recheck INR in 4 weeks.  Call clinic [phone] with any concerns.

## 2019-03-23 ENCOUNTER — Other Ambulatory Visit: Payer: Self-pay

## 2019-03-23 ENCOUNTER — Ambulatory Visit (INDEPENDENT_AMBULATORY_CARE_PROVIDER_SITE_OTHER): Payer: 59

## 2019-03-23 DIAGNOSIS — Z7901 Long term (current) use of anticoagulants: Secondary | ICD-10-CM

## 2019-03-23 DIAGNOSIS — Z952 Presence of prosthetic heart valve: Secondary | ICD-10-CM

## 2019-03-23 LAB — POCT INR: INR: 2 (ref 2.0–3.0)

## 2019-03-23 MED ORDER — WARFARIN SODIUM 7.5 MG PO TABS: ORAL_TABLET | ORAL | 1 refills | Status: DC

## 2019-03-23 NOTE — Patient Instructions (Signed)
Description   Start taking 1 tablet daily except 1.5 tablets on Fridays. Recheck INR in 3-4 weeks.  Call clinic [phone] with any concerns.

## 2019-04-03 ENCOUNTER — Ambulatory Visit (INDEPENDENT_AMBULATORY_CARE_PROVIDER_SITE_OTHER): Payer: 59 | Admitting: Obstetrics & Gynecology

## 2019-04-03 ENCOUNTER — Other Ambulatory Visit: Payer: Self-pay

## 2019-04-03 ENCOUNTER — Encounter

## 2019-04-03 ENCOUNTER — Encounter: Payer: Self-pay | Admitting: Obstetrics & Gynecology

## 2019-04-03 VITALS — BP 120/65 | HR 60 | Ht 61.0 in | Wt 152.0 lb

## 2019-04-03 DIAGNOSIS — M810 Age-related osteoporosis without current pathological fracture: Secondary | ICD-10-CM | POA: Diagnosis not present

## 2019-04-03 DIAGNOSIS — Z0000 Encounter for general adult medical examination without abnormal findings: Secondary | ICD-10-CM

## 2019-04-03 DIAGNOSIS — Z01419 Encounter for gynecological examination (general) (routine) without abnormal findings: Secondary | ICD-10-CM | POA: Diagnosis not present

## 2019-04-03 LAB — POCT URINALYSIS DIPSTICK
Bilirubin, UA: NEGATIVE
Blood, UA: NEGATIVE
Glucose, UA: NEGATIVE
Ketones, UA: NEGATIVE
Leukocytes, UA: NEGATIVE
Nitrite, UA: NEGATIVE
Protein, UA: NEGATIVE
Urobilinogen, UA: 0.2 E.U./dL
pH, UA: 6 (ref 5.0–8.0)

## 2019-04-03 NOTE — Progress Notes (Addendum)
58 y.o. [place] or Caucasian female here for annual exam.  Denies vaginal bleeding.    Has not gotten a flu shot.  She's been working from home.    Saw Dr. Johnsie Cancel in March.  Together with significant other for 25 years.  PCP:  Dr [name]  Patient's last menstrual period was 05/18/2003.          Sexually active: Yes.    The current method of family planning is post menopausal status.    Exercising: walking Smoker:  no  Health Maintenance: Pap:  07/19/16 neg. HR HPV:neg              01/08/14 Neg  History of abnormal Pap:  Yes (remote hx) MMG:  11/04/15 BIRADS 1 negative/density c.  Aware this is due.   Colonoscopy:  11/22/13 Polyps. F/u 5 years.  She is aware this is due.  Dr. [name] BMD:   09/25/14 Osteoporosis  TDaP:  10/2009 Hep C testing: 08/18/17 Neg Screening Labs: today   reports that she has never smoked. She has never used smokeless tobacco. She reports current alcohol use of about 1.0 - 2.0 standard drinks of alcohol per week. She reports that she does not use drugs.  Past Medical History:  Diagnosis Date  . Anemia   . Aortic stenosis     by Dr. Prescott Gum  . CHEST PAIN UNSPECIFIED    Qualifier: Diagnosis of  By: Burnett Harry MD, Shanon Brow    . Colon polyp   . Costochondritis    x2  . Diverticulosis   . IBS (irritable bowel syndrome)   . Long term current use of anticoagulant   . Migraine   . OAB (overactive bladder)   . Osteopenia   . Osteoporosis   . Rash   . S/P AVR    2001 for bicuspid AV.....Marland KitchenST JUDE  . Shingles    ? diag/rash 11/21/12  . Shortness of breath    Qualifier: Diagnosis of  By: Allean Found, LPN, [name]    . Vulvar ulceration    recurrent    Past Surgical History:  Procedure Laterality Date  . AORTIC VALVE REPLACEMENT  2001   [employer]  . APPENDECTOMY     post op abdominal wall hematoma/2 transfusions  . BREAST BIOPSY  5/08   left breast  . CESAREAN SECTION      Current Outpatient Medications  Medication Sig Dispense Refill  . amoxicillin  (AMOXIL) 500 MG capsule as directed.  1  . Calcium-Magnesium-Vitamin D (CITRACAL SLOW RELEASE PO) Take 2 capsules by mouth daily. 1200mg s + D3, Slow Release    . FIBER PO Take 1 tablet by mouth daily. Gummies    . Multiple Vitamins-Minerals (MULTIVITAMIN GUMMIES ADULTS PO) Take 2 tablets by mouth daily. GUMMIES    . warfarin (COUMADIN) 7.5 MG tablet Take as directed by Coumadin Clinic 100 tablet 1   No current facility-administered medications for this visit.     Family History  Problem Relation Age of Onset  . Hypertension Mother   . Diabetes Mother   . Hyperlipidemia Mother   . Diabetes Brother   . Hyperlipidemia Brother   . Hypertension Brother   . Diabetes Son   . Alzheimer's disease Maternal Grandfather   . Diabetes Maternal Grandfather     Review of Systems  All other systems reviewed and are negative.   Exam:   Vitals:   04/03/19 1406  BP: 120/65  Pulse: 60    General appearance: alert, cooperative and  appears stated age Head: Normocephalic, without obvious abnormality, atraumatic Neck: no adenopathy, supple, symmetrical, trachea midline and thyroid normal to inspection and palpation Lungs: clear to auscultation bilaterally Breasts: normal appearance, no masses or tenderness Heart: regular rate and rhythm, mid systolic click noted, no murmurs Abdomen: soft, non-tender; bowel sounds normal; no masses,  no organomegaly Extremities: extremities normal, atraumatic, no cyanosis or edema Skin: Skin color, texture, turgor normal. No rashes or lesions Lymph nodes: Cervical, supraclavicular, and axillary nodes normal. No abnormal inguinal nodes palpated Neurologic: Grossly normal   Pelvic: External genitalia:  no lesions              Urethra:  normal appearing urethra with no masses, tenderness or lesions              Bartholins and Skenes: normal                 Vagina: normal appearing vagina with normal color and discharge, no lesions              Cervix: no  lesions              Pap taken: Yes.   Bimanual Exam:  Uterus:  normal size, contour, position, consistency, mobility, non-tender              Adnexa: normal adnexa and no mass, fullness, tenderness               Rectovaginal: Confirms               Anus:  normal sphincter tone, no lesions  Chaperone was present for exam.  A:  Well Woman with normal exam PMP, no HRT H/o valve repair, 2001 with mechanical valve on coumadin Osteoporosis.  Has declined treatment but will have repeat BMD. OAB H/o diverticulosis  P:   Mammogram guidelines reviewed.  We will schedule MMG and BMD for pt Aware colonoscopy due.  Plans to have this done next year pap smear not indicated today.  Neg with neg HR HPV 2018. CBC, CMP, Lipids, TSH and Vit D obtained today Tdap due next year Flu and shingrix due.  Information given about having this done. Return annually or prn

## 2019-04-03 NOTE — Patient Instructions (Signed)
Outpatient Pharmacy at [place],  [place]  [address]  Main: [phone]  Sunday:Closed Monday:7:30 AM - 6:00 PM Tuesday:7:30 AM - 6:00 PM Wednesday:7:30 AM - 6:00 PM Thursday:7:30 AM - 6:00 PM Friday:7:30 AM - 6:00 PM Saturday:Closed

## 2019-04-04 ENCOUNTER — Other Ambulatory Visit: Payer: Self-pay | Admitting: Obstetrics & Gynecology

## 2019-04-04 ENCOUNTER — Telehealth: Payer: Self-pay | Admitting: *Deleted

## 2019-04-04 DIAGNOSIS — Z1231 Encounter for screening mammogram for malignant neoplasm of breast: Secondary | ICD-10-CM

## 2019-04-04 LAB — LIPID PANEL
Chol/HDL Ratio: 3.6 ratio (ref 0.0–4.4)
Cholesterol, Total: 251 mg/dL — ABNORMAL HIGH (ref 100–199)
HDL: 69 mg/dL (ref >39)
LDL Chol Calc (NIH): 159 mg/dL — ABNORMAL HIGH (ref 0–99)
Triglycerides: 133 mg/dL (ref 0–149)
VLDL Cholesterol Cal: 23 mg/dL (ref 5–40)

## 2019-04-04 LAB — COMPREHENSIVE METABOLIC PANEL
ALT: 13 IU/L (ref 0–32)
AST: 20 IU/L (ref 0–40)
Albumin/Globulin Ratio: 1.6 (ref 1.2–2.2)
Albumin: 4.6 g/dL (ref 3.8–4.9)
Alkaline Phosphatase: 91 IU/L (ref 39–117)
BUN/Creatinine Ratio: 17 (ref 9–23)
BUN: 12 mg/dL (ref 6–24)
Bilirubin Total: 0.7 mg/dL (ref 0.0–1.2)
CO2: 27 mmol/L (ref 20–29)
Calcium: 9.3 mg/dL (ref 8.7–10.2)
Chloride: 99 mmol/L (ref 96–106)
Creatinine, Ser: 0.71 mg/dL (ref 0.57–1.00)
GFR calc Af Amer: 109 mL/min/{1.73_m2} (ref >59)
GFR calc non Af Amer: 95 mL/min/{1.73_m2} (ref >59)
Globulin, Total: 2.8 g/dL (ref 1.5–4.5)
Glucose: 97 mg/dL (ref 65–99)
Potassium: 4.1 mmol/L (ref 3.5–5.2)
Sodium: 139 mmol/L (ref 134–144)
Total Protein: 7.4 g/dL (ref 6.0–8.5)

## 2019-04-04 LAB — CBC
Hematocrit: 40 % (ref 34.0–46.6)
Hemoglobin: 13.8 g/dL (ref 11.1–15.9)
MCH: 31.2 pg (ref 26.6–33.0)
MCHC: 34.5 g/dL (ref 31.5–35.7)
MCV: 90 fL (ref 79–97)
Platelets: 181 10*3/uL (ref 150–450)
RBC: 4.43 x10E6/uL (ref 3.77–5.28)
RDW: 13 % (ref 11.7–15.4)
WBC: 5.2 10*3/uL (ref 3.4–10.8)

## 2019-04-04 LAB — VITAMIN D 25 HYDROXY (VIT D DEFICIENCY, FRACTURES): Vit D, 25-Hydroxy: 29.4 ng/mL — ABNORMAL LOW (ref 30.0–100.0)

## 2019-04-04 LAB — TSH: TSH: 3.53 u[IU]/mL (ref 0.450–4.500)

## 2019-04-04 NOTE — Telephone Encounter (Signed)
Spoke with Lilia Pro at [facility]. Patient scheduled for MMG on 06/07/19 at 7am, BMD to follow at 7:30am.   Patient notified of appt. Patient is agreeable to date and time.   Routing to provider for final review. Patient is agreeable to disposition. Will close encounter.

## 2019-04-04 NOTE — Telephone Encounter (Signed)
-----   Message from Megan Salon, MD sent at 04/03/2019  2:37 PM EST ----- Regarding: MMG and BMD Could you please scheduled MMG and BMD for this pt.  Order for BMD has been placed.  No mondays and early morning is best.  Thanks.  Vanessa Owens

## 2019-04-25 ENCOUNTER — Other Ambulatory Visit: Payer: Self-pay

## 2019-04-25 ENCOUNTER — Ambulatory Visit (INDEPENDENT_AMBULATORY_CARE_PROVIDER_SITE_OTHER): Payer: 59 | Admitting: *Deleted

## 2019-04-25 DIAGNOSIS — Z952 Presence of prosthetic heart valve: Secondary | ICD-10-CM

## 2019-04-25 DIAGNOSIS — Z7901 Long term (current) use of anticoagulants: Secondary | ICD-10-CM | POA: Diagnosis not present

## 2019-04-25 LAB — POCT INR: INR: 3.2 — AB (ref 2.0–3.0)

## 2019-04-25 NOTE — Patient Instructions (Signed)
Description   Continue taking 1 tablet daily except 1.5 tablets on Fridays. Recheck INR in 6 weeks.  Call clinic [phone] with any concerns.     

## 2019-06-07 ENCOUNTER — Other Ambulatory Visit: Payer: Self-pay

## 2019-06-07 ENCOUNTER — Ambulatory Visit
Admission: RE | Admit: 2019-06-07 | Discharge: 2019-06-07 | Disposition: A | Discharge status: Home / Self Care | Payer: 59 | Source: Ambulatory Visit | Attending: Obstetrics & Gynecology | Admitting: Obstetrics & Gynecology

## 2019-06-07 ENCOUNTER — Ambulatory Visit (INDEPENDENT_AMBULATORY_CARE_PROVIDER_SITE_OTHER): Payer: 59 | Admitting: *Deleted

## 2019-06-07 DIAGNOSIS — M810 Age-related osteoporosis without current pathological fracture: Secondary | ICD-10-CM

## 2019-06-07 DIAGNOSIS — Z1231 Encounter for screening mammogram for malignant neoplasm of breast: Secondary | ICD-10-CM

## 2019-06-07 DIAGNOSIS — Z952 Presence of prosthetic heart valve: Secondary | ICD-10-CM

## 2019-06-07 DIAGNOSIS — Z7901 Long term (current) use of anticoagulants: Secondary | ICD-10-CM

## 2019-06-07 LAB — POCT INR: INR: 3 (ref 2.0–3.0)

## 2019-06-07 NOTE — Patient Instructions (Signed)
Description   Continue taking 1 tablet daily except 1.5 tablets on Fridays. Recheck INR in 6 weeks.  Call clinic [phone] with any concerns.     

## 2019-06-15 ENCOUNTER — Other Ambulatory Visit: Payer: Self-pay

## 2019-06-15 ENCOUNTER — Encounter: Payer: Self-pay | Admitting: Obstetrics & Gynecology

## 2019-06-15 ENCOUNTER — Telehealth (INDEPENDENT_AMBULATORY_CARE_PROVIDER_SITE_OTHER): Payer: 59 | Admitting: Obstetrics & Gynecology

## 2019-06-15 DIAGNOSIS — Z8262 Family history of osteoporosis: Secondary | ICD-10-CM

## 2019-06-15 DIAGNOSIS — M810 Age-related osteoporosis without current pathological fracture: Secondary | ICD-10-CM | POA: Diagnosis not present

## 2019-06-15 NOTE — Progress Notes (Signed)
Virtual Visit via Video Note  I connected with Vanessa Owens on 06/15/19 at  2:00 PM EST by a video enabled telemedicine application and verified that I am speaking with the correct person using two identifiers.  Location: Patient: her [place] Provider: office   I discussed the limitations of evaluation and management by telemedicine and the availability of in person appointments. The patient expressed understanding and agreed to proceed.  History of Present Illness: 63 yrs Single Caucasian G2P2  female here to discuss recent BMD obtained 06/07/2019 showing osteoporosis in spine with T score -3.1.  Pt did use Depo Provera for several years, is on coumadin long term and does have family hx of osteoporosis in her mother.  Results reviewed and compared to 2007 and 2016 results.  BMD is fairly stable from 2016.  We discussed treatment at that time which she declined.    Osteoporosis Risk Factors  Nonmodifiable Personal Hx of fracture as an adult: no Hx of fracture in first-degree relative: no Caucasian race: yes Advanced age: no Female sex: yes Dementia: no Poor health/frailty: no  Potentially modifiable: Tobacco use: no Low body weight (<127 lbs): no Estrogen deficiency:  early menopause (age <45) or bilateral ovariectomy: no  prolonged premenopausal amenorrhea (>1 yr): no Low calcium intake (lifelong): no Alcohol use more than 2 drinks per day: no Recurrent falls: no Inadequate physical activity: no  Review of Systems Pertinent items noted in HPI and remainder of comprehensive ROS otherwise negative.     Objective:   PHYSICAL EXAM LMP 05/18/2003  General appearance: alert, cooperative and no distress  Imaging Bone Density: Spine T Score: -3.2, Hip T Score: -2.0  Done on 06/07/2019                                         Assessment:   Osteoporosis with T score -3.1.  T score was -3.2 in 2016.   Plan:   1.  Treatment options reviewed.  Feel Actonel would be a good  consideration. Reviewed other bisphosphonates as well as Evista (which she should not take due to coumadin use.  Prolia and Forteo reviewed as well. 2.  Exercise recommended at least 30 minutes 3 times per week.  3.  Recommendation to avoid heavy ETOH use.  4.  Repeat bone density in 2 years. 5.  Testing for secondary causes was done in 2016   I discussed the assessment and treatment plan with the patient. The patient was provided an opportunity to ask questions and all were answered. The patient agreed with the plan and demonstrated an understanding of the instructions.   The patient was advised to call back or seek an in-person evaluation if the symptoms worsen or if the condition fails to improve as anticipated.  I provided 23 minutes of non-face-to-face time during this encounter.   Megan Salon, MD

## 2019-07-23 ENCOUNTER — Ambulatory Visit (INDEPENDENT_AMBULATORY_CARE_PROVIDER_SITE_OTHER): Payer: 59 | Admitting: *Deleted

## 2019-07-23 ENCOUNTER — Other Ambulatory Visit: Payer: Self-pay

## 2019-07-23 DIAGNOSIS — Z7901 Long term (current) use of anticoagulants: Secondary | ICD-10-CM

## 2019-07-23 DIAGNOSIS — Z5181 Encounter for therapeutic drug level monitoring: Secondary | ICD-10-CM | POA: Diagnosis not present

## 2019-07-23 LAB — POCT INR: INR: 3 (ref 2.0–3.0)

## 2019-07-23 NOTE — Patient Instructions (Signed)
Description   Continue taking 1 tablet daily except 1.5 tablets on Fridays. Recheck INR in 6 weeks.  Call clinic [phone] with any concerns.     

## 2019-07-26 NOTE — Progress Notes (Signed)
Patient ID: Vanessa Owens, female   DOB: 1960-07-05, 59 y.o.   MRN: [id number]     58 y.o. 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 On chronic coumadin Wants her f/u echo's at [facility] Due to cost.    New house in 2017  Working too much at [employer] center and [place]  grand baby in [place] that she misses  07/15/16 Atypical chest pain. F/U ETT 07/28/16 was normal   Had a tooth infection or something that caused facial swelling in October 2018 had SBE For crown and got antibiotics  TTE 10/25/17 EF 55-60% AVR mean gradient 21 mmHg peak 38 mmHg stable  DVI .75 mild MR  Working virtually for [employer] also working some at [employer]. More sedentary than she should Be Getting vaccine     ROS: Denies fever, malais, weight loss, blurry vision, decreased visual acuity, cough, sputum, SOB, hemoptysis, pleuritic pain, palpitaitons, heartburn, abdominal pain, melena, lower extremity edema, claudication, or rash.  All other systems reviewed and negative  General: Vitals:   08/09/19 0814  BP: (!) 120/94  Pulse: 76  SpO2: 99%   Affect appropriate Healthy:  appears stated age HEENT: normal Neck supple with no adenopathy JVP normal no bruits no thyromegaly Lungs clear with no wheezing and good diaphragmatic motion Heart:  [id number] click mechanical valve SEM  No AR  murmur, no rub, gallop or click PMI normal Abdomen: benighn, BS positve, no tenderness, no AAA no bruit.  No HSM or HJR Distal pulses intact with no bruits No edema Neuro non-focal Skin warm and dry No muscular weakness   Current Outpatient Medications  Medication Sig Dispense Refill  . amoxicillin (AMOXIL) 500 MG capsule as directed.  1  . Calcium-Magnesium-Vitamin D (CITRACAL SLOW RELEASE PO) Take 2 capsules by mouth daily. 1200mg s + D3, Slow Release    . FIBER PO Take 1 tablet by mouth daily. Gummies    . Multiple Vitamins-Minerals (MULTIVITAMIN GUMMIES ADULTS PO) Take 2 tablets by mouth  daily. GUMMIES    . warfarin (COUMADIN) 7.5 MG tablet Take as directed by Coumadin Clinic 100 tablet 1   No current facility-administered medications for this visit.    Allergies  Morphine and Morphine and related  Electrocardiogram: 07/19/17 SR rate 66 normal 08/09/19 SR rate 76 normal   Assessment and Plan  AVR:  2001 for bicuspid valve mean gradient stable 10/25/17 no AR DVI .44  Continue SBE prophylaxis Will update echo June 2021 given age of valve   LLQ Pain:  F/u Vanessa Owens on viberzi  ? IBS  Anticoagulation no bleeding issues  Coumadin clinic INR 07/23/19 3.0 therapeutic    Depression:  F/u Vanessa Owens could use SSRI  Chest Pain:  Resolved normal ETT 07/28/16 no need to repeat   Echo for AVR at [facility] June 2021  F/U in a year if stable   Vanessa Owens

## 2019-08-09 ENCOUNTER — Ambulatory Visit (INDEPENDENT_AMBULATORY_CARE_PROVIDER_SITE_OTHER): Payer: 59 | Admitting: Cardiovascular Disease

## 2019-08-09 ENCOUNTER — Other Ambulatory Visit: Payer: Self-pay

## 2019-08-09 ENCOUNTER — Encounter: Payer: Self-pay | Admitting: Cardiovascular Disease

## 2019-08-09 VITALS — BP 120/94 | HR 76 | Ht 61.0 in | Wt 160.0 lb

## 2019-08-09 DIAGNOSIS — Z952 Presence of prosthetic heart valve: Secondary | ICD-10-CM

## 2019-08-09 NOTE — Patient Instructions (Signed)
Medication Instructions:  *If you need a refill on your cardiac medications before your next appointment, please call your pharmacy*  Lab Work: If you have labs (blood work) drawn today and your tests are completely normal, you will receive your results only by: . MyChart Message (if you have MyChart) OR . A paper copy in the mail If you have any lab test that is abnormal or we need to change your treatment, we will call you to review the results.  Follow-Up: At [facility], you and your health needs are our priority.  As part of our continuing mission to provide you with exceptional heart care, we have created designated Provider Care Teams.  These Care Teams include your primary Cardiologist (physician) and Advanced Practice Providers (APPs -  Physician Assistants and Nurse Practitioners) who all work together to provide you with the care you need, when you need it.  We recommend signing up for the patient portal called "MyChart".  Sign up information is provided on this After Visit Summary.  MyChart is used to connect with patients for Virtual Visits (Telemedicine).  Patients are able to view lab/test results, encounter notes, upcoming appointments, etc.  Non-urgent messages can be sent to your provider as well.   To learn more about what you can do with MyChart, go to [email or web address].[email or web address].    Your next appointment:   1 year(s)  The format for your next appointment:   In Person  Provider:   You may see Dr. Nishan or one of the following Advanced Practice Providers on your designated Care Team:    Lori Gerhardt, NP  Laura Ingold, NP  Jill McDaniel, NP     

## 2019-08-27 ENCOUNTER — Other Ambulatory Visit: Payer: Self-pay | Admitting: Cardiovascular Disease

## 2019-09-03 ENCOUNTER — Ambulatory Visit (INDEPENDENT_AMBULATORY_CARE_PROVIDER_SITE_OTHER): Payer: 59 | Admitting: *Deleted

## 2019-09-03 ENCOUNTER — Other Ambulatory Visit: Payer: Self-pay

## 2019-09-03 DIAGNOSIS — Z7901 Long term (current) use of anticoagulants: Secondary | ICD-10-CM | POA: Diagnosis not present

## 2019-09-03 DIAGNOSIS — Z5181 Encounter for therapeutic drug level monitoring: Secondary | ICD-10-CM | POA: Diagnosis not present

## 2019-09-03 LAB — POCT INR: INR: 2.9 (ref 2.0–3.0)

## 2019-09-03 NOTE — Patient Instructions (Signed)
Description   Continue taking 1 tablet daily except 1.5 tablets on Fridays. Recheck INR in 6 weeks.  Call clinic [phone] with any concerns.     

## 2019-10-18 ENCOUNTER — Ambulatory Visit (INDEPENDENT_AMBULATORY_CARE_PROVIDER_SITE_OTHER): Payer: No Typology Code available for payment source | Admitting: *Deleted

## 2019-10-18 ENCOUNTER — Other Ambulatory Visit: Payer: Self-pay

## 2019-10-18 DIAGNOSIS — Z7901 Long term (current) use of anticoagulants: Secondary | ICD-10-CM

## 2019-10-18 DIAGNOSIS — Z952 Presence of prosthetic heart valve: Secondary | ICD-10-CM

## 2019-10-18 LAB — POCT INR: INR: 1.9 — AB (ref 2.0–3.0)

## 2019-10-18 NOTE — Patient Instructions (Addendum)
Description   Today take 1.5 tablets then continue taking 1 tablet daily except 1.5 tablets on Fridays. Recheck INR in 5 weeks.  Call clinic [phone] with any concerns.

## 2019-11-22 ENCOUNTER — Other Ambulatory Visit: Payer: Self-pay

## 2019-11-22 ENCOUNTER — Ambulatory Visit (INDEPENDENT_AMBULATORY_CARE_PROVIDER_SITE_OTHER): Payer: No Typology Code available for payment source | Admitting: *Deleted

## 2019-11-22 DIAGNOSIS — Z952 Presence of prosthetic heart valve: Secondary | ICD-10-CM

## 2019-11-22 DIAGNOSIS — Z7901 Long term (current) use of anticoagulants: Secondary | ICD-10-CM

## 2019-11-22 LAB — POCT INR: INR: 3.3 — AB (ref 2.0–3.0)

## 2019-11-22 NOTE — Patient Instructions (Signed)
Description   Continue taking 1 tablet daily except 1.5 tablets on Fridays. Recheck INR in 6 weeks.  Call clinic [phone] with any concerns.     

## 2019-11-23 ENCOUNTER — Other Ambulatory Visit: Payer: Self-pay | Admitting: Cardiovascular Disease

## 2020-01-04 ENCOUNTER — Other Ambulatory Visit: Payer: Self-pay

## 2020-01-04 ENCOUNTER — Ambulatory Visit (INDEPENDENT_AMBULATORY_CARE_PROVIDER_SITE_OTHER): Payer: No Typology Code available for payment source | Admitting: *Deleted

## 2020-01-04 DIAGNOSIS — Z7901 Long term (current) use of anticoagulants: Secondary | ICD-10-CM

## 2020-01-04 DIAGNOSIS — Z952 Presence of prosthetic heart valve: Secondary | ICD-10-CM

## 2020-01-04 LAB — POCT INR: INR: 2.4 (ref 2.0–3.0)

## 2020-01-04 NOTE — Patient Instructions (Signed)
Description   Today take 2 tablets then continue taking 1 tablet daily except 1.5 tablets on Fridays. Recheck INR in 6 weeks.  Call clinic [phone] with any concerns.

## 2020-01-07 ENCOUNTER — Telehealth: Payer: Self-pay

## 2020-01-07 NOTE — Telephone Encounter (Signed)
AEX 03/2019, next scheduled 06/2020 with SM H/o Osteoporosis with last BMD 05/2019 FH osteoporosis in mother  Pt taking coumadin Rx due to aortic valve replacement 2001.  Spoke with pt. Pt was last seen via video visit by Dr Sabra Heck on 06/15/19 to discuss BMD results. Pt states now agreeable to have Rx for treatment. Pt advised to have OV to further discuss and answer all questions. Pt agreeable.  Pt scheduled with Dr Sabra Heck on 8/31 at 1130 am. Pt agreeable to date and time of appt.   Routing to Dr Sabra Heck for review. Encounter closed.  Notes reviewed from video visit:  Plan:   1.  Treatment options reviewed.  Feel Actonel would be a good consideration. Reviewed other bisphosphonates as well as Evista (which she should not take due to coumadin use.  Prolia and Forteo reviewed as well. 2.  Exercise recommended at least 30 minutes 3 times per week.  3.  Recommendation to avoid heavy ETOH use.  4.  Repeat bone density in 2 years. 5.  Testing for secondary causes was done in 2016

## 2020-01-07 NOTE — Telephone Encounter (Signed)
Patient is calling in regards to wanting to start a new medication.

## 2020-01-11 NOTE — Progress Notes (Signed)
GYNECOLOGY  VISIT  CC:   Osteoporosis treatment  HPI: 59 y.o. G2P2 Single White or Caucasian female here for discussion of treatment options for BMD.  Pt and I did virtual visit on 06/15/2019 regarding BMD and treatment options.  Has decided to proceed with treatment.  Not fully sure of discussion.  BMD with t score -3.1 in spine.  Treatment options reviewed with pt including bisphosphonates, prolia and forteo.  Pt on chronic anticoagulation so should not use Evista.  Reviewed with her.  Oral bisphosphonates as first line treatment recommended.  Due to findings in spine, feel should start with Actonel.  Daily, weekly and monthly dosing discussed.  Follow up BMD in 2 years after starting discussed.  Instructions for taking medication to decrease reflux risk discussed.  Questions answered.  Pt ready to proceed.  Had had TSH, CMP and Vit d within the 9 months.  GYNECOLOGIC HISTORY: Patient's last menstrual period was 05/18/2003. Contraception: post menopausal Menopausal hormone therapy: none  Patient Active Problem List   Diagnosis Date Noted  . Hx of colonic polyps 05/06/2017  . Fatigue 05/06/2017  . Internal hemorrhoid 02/19/2015  . Rectal bleeding 02/19/2015  . Osteoporosis 11/28/2014  . Abdominal pain, left lower quadrant 10/07/2014  . IBS (irritable bowel syndrome) 10/07/2014  . Diverticulitis of colon 03/19/2014  . Special screening for malignant neoplasms, colon 10/19/2013  . Chronic anticoagulation 10/19/2013  . S/P aortic valve replacement 12/26/2012  . CHEST PAIN UNSPECIFIED 07/31/2010  . Long term current use of anticoagulant 06/29/2010  . SHORTNESS OF BREATH 10/03/2008  . AORTIC VALVE DISORDERS 03/31/2008  . H/O aortic valve replacement 03/31/2008  . COSTOCHONDRITIS 05/01/2007    Past Medical History:  Diagnosis Date  . Anemia   . Aortic stenosis     by Dr. Prescott Gum  . CHEST PAIN UNSPECIFIED    Qualifier: Diagnosis of  By: Burnett Harry MD, Shanon Brow    . Colon polyp   .  Costochondritis    x2  . Diverticulosis   . IBS (irritable bowel syndrome)   . Long term current use of anticoagulant   . Migraine   . OAB (overactive bladder)   . Osteoporosis   . Rash   . S/P AVR    2001 for bicuspid AV.....Marland KitchenST JUDE  . Shingles    ? diag/rash 11/21/12  . Shortness of breath    Qualifier: Diagnosis of  By: Allean Found, LPN, [name]    . Vulvar ulceration    recurrent    Past Surgical History:  Procedure Laterality Date  . AORTIC VALVE REPLACEMENT  2001   St Jude  . APPENDECTOMY     post op abdominal wall hematoma/2 transfusions  . BREAST BIOPSY  5/08   left breast  . CESAREAN SECTION      MEDS:   Current Outpatient Medications on File Prior to Visit  Medication Sig Dispense Refill  . warfarin (COUMADIN) 7.5 MG tablet Take 1 tablet daily except 1/2 tablets on Friday or as directed by the coumadin clinic. 105 tablet 0  . amoxicillin (AMOXIL) 500 MG capsule as directed. (Patient not taking: Reported on 01/15/2020)  1   No current facility-administered medications on file prior to visit.    ALLERGIES: Morphine and related  Family History  Problem Relation Age of Onset  . Hypertension Mother   . Diabetes Mother   . Hyperlipidemia Mother   . Diabetes Brother   . Hyperlipidemia Brother   . Hypertension Brother   . Diabetes Son   .  Alzheimer's disease Maternal Grandfather   . Diabetes Maternal Grandfather     SH:  Single, non smoker  Review of Systems  Constitutional: Negative.   HENT: Negative.   Eyes: Negative.   Respiratory: Negative.   Cardiovascular: Negative.   Gastrointestinal: Negative.   Endocrine: Negative.   Genitourinary: Negative.   Musculoskeletal: Negative.   Skin: Negative.   Allergic/Immunologic: Negative.   Neurological: Negative.   Hematological: Negative.   Psychiatric/Behavioral: Negative.     PHYSICAL EXAMINATION:    Wt 156 lb (70.8 kg)   LMP 05/18/2003   BMI 29.48 kg/m     General appearance: alert, cooperative  and appears stated age   Assessment: Osteoporosis without pathologic fracture Long term coumadin use H/o Depo Provera use for >10 years.  Plan: Will start Actonel 35mg  weekly.  Instructions for taking given and discussed. Repeat BMD 2 years from now. Pt knows to call with increased reflux symptoms. PTH with intact calcium obtained today.   24 minutes spent in discussion with pt

## 2020-01-15 ENCOUNTER — Encounter: Payer: Self-pay | Admitting: Obstetrics & Gynecology

## 2020-01-15 ENCOUNTER — Ambulatory Visit (INDEPENDENT_AMBULATORY_CARE_PROVIDER_SITE_OTHER): Payer: No Typology Code available for payment source | Admitting: Obstetrics & Gynecology

## 2020-01-15 ENCOUNTER — Other Ambulatory Visit: Payer: Self-pay

## 2020-01-15 VITALS — BP 118/80 | HR 70 | Resp 16 | Wt 156.0 lb

## 2020-01-15 DIAGNOSIS — M8000XA Age-related osteoporosis with current pathological fracture, unspecified site, initial encounter for fracture: Secondary | ICD-10-CM | POA: Diagnosis not present

## 2020-01-15 MED ORDER — RISEDRONATE SODIUM 35 MG PO TABS: 35.0000 mg | ORAL_TABLET | ORAL | 2 refills | Status: DC

## 2020-01-16 LAB — PTH, INTACT AND CALCIUM
Calcium: 9 mg/dL (ref 8.7–10.2)
PTH: 28 pg/mL (ref 15–65)

## 2020-02-14 ENCOUNTER — Other Ambulatory Visit: Payer: Self-pay

## 2020-02-14 ENCOUNTER — Ambulatory Visit (INDEPENDENT_AMBULATORY_CARE_PROVIDER_SITE_OTHER): Payer: No Typology Code available for payment source

## 2020-02-14 DIAGNOSIS — Z7901 Long term (current) use of anticoagulants: Secondary | ICD-10-CM | POA: Diagnosis not present

## 2020-02-14 LAB — POCT INR: INR: 2.9 (ref 2.0–3.0)

## 2020-02-14 NOTE — Patient Instructions (Signed)
Description   Continue taking 1 tablet daily except 1.5 tablets on Fridays. Recheck INR in 6 weeks.  Call clinic [phone] with any concerns.

## 2020-02-16 ENCOUNTER — Other Ambulatory Visit: Payer: Self-pay | Admitting: Cardiovascular Disease

## 2020-02-20 ENCOUNTER — Emergency Department (INDEPENDENT_AMBULATORY_CARE_PROVIDER_SITE_OTHER)
Admission: EM | Admit: 2020-02-20 | Discharge: 2020-02-20 | Disposition: A | Discharge status: Home / Self Care | Payer: No Typology Code available for payment source | Source: Home / Self Care

## 2020-02-20 ENCOUNTER — Emergency Department (INDEPENDENT_AMBULATORY_CARE_PROVIDER_SITE_OTHER): Payer: No Typology Code available for payment source

## 2020-02-20 ENCOUNTER — Other Ambulatory Visit: Payer: Self-pay

## 2020-02-20 DIAGNOSIS — S62607A Fracture of unspecified phalanx of left little finger, initial encounter for closed fracture: Secondary | ICD-10-CM | POA: Diagnosis not present

## 2020-02-20 DIAGNOSIS — W010XXA Fall on same level from slipping, tripping and stumbling without subsequent striking against object, initial encounter: Secondary | ICD-10-CM

## 2020-02-20 NOTE — Discharge Instructions (Addendum)
Call orthopaedics tomorrow regarding finger fracture and schedule a follow-up appointment. You may take tylenol with for pain management. Leave splint in place, may remove for bathing only

## 2020-02-20 NOTE — ED Triage Notes (Signed)
Pt presents to Urgent Care with c/o L hand injury that occurred 3 days ago. Pt reports falling on the ground and landed on her L arm/hand. 5th finger w/ limited ROM, pain, and minimal numbness. Fingers 3-5 are noted to be bruised and swollen. Pt also reports L forearm swelling/pain and there is a skin abrasion noted on lateral L forearm.

## 2020-02-21 NOTE — ED Provider Notes (Signed)
[facility]    CSN: [id number] Arrival date & time: 02/20/20  1807      History   Chief Complaint Chief Complaint  Patient presents with  . Hand Injury    Left    HPI TYLIE Owens is a 59 y.o. female.   HPI Patient presents with an injury to the left hand.  Patient reports that she was hiking at [place] and sustained a fall landing on her left upper extremity.  She immediately bruise as she is on a chronic blood thinner.  Injury occurred 3 days ago.  She presents that she is having some pain at the lateral fifth digit and at the wrist.  She has applied ice and elevate it to help with swelling.  No prior injuries to the left wrist or left hand.  Endorses full sensation of hand and fingers Past Medical History:  Diagnosis Date  . Anemia   . Aortic stenosis     by Dr. Prescott Gum  . CHEST PAIN UNSPECIFIED    Qualifier: Diagnosis of  By: Burnett Harry MD, Shanon Brow    . Colon polyp   . Costochondritis    x2  . Diverticulosis   . IBS (irritable bowel syndrome)   . Long term current use of anticoagulant   . Migraine   . OAB (overactive bladder)   . Osteoporosis   . Rash   . S/P AVR    2001 for bicuspid AV.....Marland KitchenST JUDE  . Shingles    ? diag/rash 11/21/12  . Shortness of breath    Qualifier: Diagnosis of  By: Allean Found, LPN, [name]    . Vulvar ulceration    recurrent    Patient Active Problem List   Diagnosis Date Noted  . Hx of colonic polyps 05/06/2017  . Fatigue 05/06/2017  . Internal hemorrhoid 02/19/2015  . Rectal bleeding 02/19/2015  . Osteoporosis 11/28/2014  . Abdominal pain, left lower quadrant 10/07/2014  . IBS (irritable bowel syndrome) 10/07/2014  . Diverticulitis of colon 03/19/2014  . Special screening for malignant neoplasms, colon 10/19/2013  . Chronic anticoagulation 10/19/2013  . S/P aortic valve replacement 12/26/2012  . CHEST PAIN UNSPECIFIED 07/31/2010  . Long term current use of anticoagulant 06/29/2010  . SHORTNESS OF BREATH 10/03/2008   . AORTIC VALVE DISORDERS 03/31/2008  . H/O aortic valve replacement 03/31/2008  . COSTOCHONDRITIS 05/01/2007    Past Surgical History:  Procedure Laterality Date  . AORTIC VALVE REPLACEMENT  2001   St Jude  . APPENDECTOMY     post op abdominal wall hematoma/2 transfusions  . BREAST BIOPSY  5/08   left breast  . CESAREAN SECTION      OB History    Gravida  2   Para  2   Term      Preterm      AB      Living  2     SAB      TAB      Ectopic      Multiple      Live Births               Home Medications    Prior to Admission medications   Medication Sig Start Date End Date Taking? Authorizing Provider  warfarin (COUMADIN) 7.5 MG tablet TAKE 1 TABLET BY MOUTH DAILY EVERY DAY OF THE WEEK EXCEPT FRIDAY TAKE 1 AND 1/2 TABLET ON FRIDAYS OR AS DIRECTED BY ANTICOAGULATION CLINIC. 02/18/20  Yes Josue Hector, MD  amoxicillin (AMOXIL)  500 MG capsule as directed. Patient not taking: Reported on 01/15/2020 07/04/17   [provider]  risedronate (ACTONEL) 35 MG tablet Take 1 tablet (35 mg total) by mouth every 7 (seven) days. with water on empty stomach, nothing by mouth or lie down for next 30 minutes. 01/15/20   Megan Salon, MD    Family History Family History  Problem Relation Age of Onset  . Hypertension Mother   . Diabetes Mother   . Hyperlipidemia Mother   . Diabetes Brother   . Hyperlipidemia Brother   . Hypertension Brother   . Diabetes Son   . Alzheimer's disease Maternal Grandfather   . Diabetes Maternal Grandfather     Social History Social History   Tobacco Use  . Smoking status: Never Smoker  . Smokeless tobacco: Never Used  Vaping Use  . Vaping Use: Never used  Substance Use Topics  . Alcohol use: Yes    Alcohol/week: 1.0 - 2.0 standard drink    Types: 1 - 2 Standard drinks or equivalent per week    Comment: occ  . Drug use: No     Allergies   Morphine and related   Review of Systems Review of Systems Pertinent  negatives listed in HPI  Physical Exam Triage Vital Signs ED Triage Vitals  Enc Vitals Group     BP 02/20/20 1838 (!) 162/76     Pulse Rate 02/20/20 1838 64     Resp 02/20/20 1838 16     Temp 02/20/20 1838 98.4 F (36.9 C)     Temp Source 02/20/20 1838 Oral     SpO2 02/20/20 1838 100 %     Weight --      Height --      Head Circumference --      Peak Flow --      Pain Score 02/20/20 1833 4     Pain Loc --      Pain Edu? --      Excl. in [place]? --    No data found.  Updated Vital Signs BP (!) 162/76 (BP Location: Right Arm)   Pulse 64   Temp 98.4 F (36.9 C) (Oral)   Resp 16   LMP 05/18/2003   SpO2 100%   Visual Acuity Right Eye Distance:   Left Eye Distance:   Bilateral Distance:    Right Eye Near:   Left Eye Near:    Bilateral Near:     Physical Exam Constitutional:      Appearance: She is not ill-appearing.  Cardiovascular:     Rate and Rhythm: Normal rate and regular rhythm.  Pulmonary:     Effort: Pulmonary effort is normal.     Breath sounds: Normal breath sounds.  Musculoskeletal:     Left wrist: Swelling, tenderness and bony tenderness present.     Left hand: Tenderness present. Decreased range of motion.     Comments: Diffuse ecchymosis noted.  Full range of motion. Decreased range of motion of the fifth left digit  Neurological:     General: No focal deficit present.     Mental Status: She is alert.  Psychiatric:        Mood and Affect: Mood normal.    UC Treatments / Results  Labs (all labs ordered are listed, but only abnormal results are displayed) Labs Reviewed - No data to display  EKG   Radiology DG Forearm Left  Result Date: 02/20/2020 CLINICAL DATA:  Left hand injury EXAM: LEFT FOREARM -  2 VIEW COMPARISON:  None. FINDINGS: There is no evidence of fracture or other focal bone lesions. Soft tissues are unremarkable. IMPRESSION: Negative. Electronically Signed   By: Fidela Salisbury MD   On: 02/20/2020 19:23   DG Hand Complete  Left  Result Date: 02/20/2020 CLINICAL DATA:  Left hand injury with swelling EXAM: LEFT HAND - COMPLETE 3+ VIEW COMPARISON:  None. FINDINGS: Acute fracture involving the proximal shaft of the fifth proximal phalanx with mild ulnar and dorsal angulation of distal fracture fragment. No subluxation. IMPRESSION: Acute angulated fifth proximal phalanx fracture. Electronically Signed   By: Donavan Foil M.D.   On: 02/20/2020 19:24    Procedures Procedures (including critical care time)  Medications Ordered in UC Medications - No data to display  Initial Impression / Assessment and Plan / UC Course  I have reviewed the triage vital signs and the nursing notes.  Pertinent labs & imaging results that were available during my care of the patient were reviewed by me and considered in my medical decision making (see chart for details).    Imaging revealed a nondisplaced fracture involving the left fifth finger.  Temporary splinting of left fifth finger completed here in clinic.  Advised to follow-up with orthopedics.  Tylenol as needed for pain.  Final Clinical Impressions(s) / UC Diagnoses   Final diagnoses:  Closed nondisplaced fracture of phalanx of left little finger, unspecified phalanx, initial encounter     Discharge Instructions     Call orthopaedics tomorrow regarding finger fracture and schedule a follow-up appointment. You may take tylenol with for pain management. Leave splint in place, may remove for bathing only     ED Prescriptions    None     PDMP not reviewed this encounter.   Scot Jun, [place] 02/22/20 [phone]

## 2020-02-25 ENCOUNTER — Telehealth: Payer: Self-pay | Admitting: *Deleted

## 2020-02-25 NOTE — Telephone Encounter (Signed)
Patient with diagnosis of aortic valve replacement on warfarin for anticoagulation.    Last INR 2.9 on 02/14/20  Procedure: LEFT SMALL FINGER PINNING WITH POSSIBLE ORIF  Date of procedure: 02/27/20  Patient INR goal 2.5-3.5.  Is Ortho requesting a 2 day hold since surgery is on Wednesday?

## 2020-02-25 NOTE — Telephone Encounter (Signed)
Patient with diagnosis of aortic valve replacement on warfarin for anticoagulation.    Procedure: LEFT SMALL FINGER PINNING WITH POSSIBLE ORIF Date of procedure: TBD  INR goal 2.5-3.5.     CrCl 79 mL/min using adjusted body weight  Patient has St Jude aortic valve, but is higher risk with INR goal of 2.5-3.5.  Will route to Dr. Johnsie Cancel for opinion on whether she will require bridging.

## 2020-02-25 NOTE — Telephone Encounter (Signed)
° °  [facility] Pre-operative Risk Assessment    HEARTCARE STAFF: - Please ensure there is not already an duplicate clearance open for this procedure. - Under Visit Info/Reason for Call, type in Other and utilize the format Clearance MM/DD/YY or Clearance TBD. Do not use dashes or single digits. - If request is for dental extraction, please clarify the # of teeth to be extracted.  Request for surgical clearance:  1. What type of surgery is being performed?  LEFT SMALL FINGER PINNING WITH POSSIBLE ORIF   2. When is this surgery scheduled?  02/27/20   3. What type of clearance is required (medical clearance vs. Pharmacy clearance to hold med vs. Both)?  BOTH  4. Are there any medications that need to be held prior to surgery and how long? COUMADIN   5. Practice name and name of physician performing surgery?  [facility] / DR. ORTMANN   6. What is the office phone number?  [phone]   7.   What is the office fax number?  [phone] ATTN;  ASHLEY HILTON  8.   Anesthesia type (None, local, MAC, general) ?     Jeanann Lewandowsky 02/25/2020, 3:13 PM  _________________________________________________________________   (provider comments below)  3

## 2020-02-25 NOTE — Telephone Encounter (Signed)
Gerald Stabs- ? Higher risk age of valve typically would not bridge AVR

## 2020-02-25 NOTE — Telephone Encounter (Signed)
Ok thank you.  Was concerned since she had a higher goal

## 2020-02-25 NOTE — Telephone Encounter (Signed)
Booked appt Friday 02/29/2020 at 9:15 AM w/Laura Dorene Ar, NP. Left detailed message to CB if unable to come to this appt.

## 2020-02-25 NOTE — Telephone Encounter (Signed)
Primary Cardiologist:No primary care provider on file.  Chart reviewed as part of pre-operative protocol coverage. Because of Kassidie Hendriks Badley's past medical history and time since last visit, he/she will require a follow-up visit in order to better assess preoperative cardiovascular risk.  Pre-op covering staff: - Please schedule appointment and call patient to inform them. - Please contact requesting surgeon's office via preferred method (i.e, phone, fax) to inform them of need for appointment prior to surgery.  If applicable, this message will also be routed to pharmacy pool and/or primary cardiologist for input on holding anticoagulant/antiplatelet agent as requested below so that this information is available at time of patient's appointment.   Deberah Pelton, NP  02/25/2020, 4:18 PM

## 2020-02-26 ENCOUNTER — Ambulatory Visit (INDEPENDENT_AMBULATORY_CARE_PROVIDER_SITE_OTHER): Payer: No Typology Code available for payment source | Admitting: Nurse Practitioner

## 2020-02-26 ENCOUNTER — Encounter: Payer: Self-pay | Admitting: Nurse Practitioner

## 2020-02-26 ENCOUNTER — Other Ambulatory Visit: Payer: Self-pay

## 2020-02-26 VITALS — BP 110/74 | HR 78 | Ht 61.0 in | Wt 157.0 lb

## 2020-02-26 DIAGNOSIS — Z952 Presence of prosthetic heart valve: Secondary | ICD-10-CM

## 2020-02-26 DIAGNOSIS — Z01810 Encounter for preprocedural cardiovascular examination: Secondary | ICD-10-CM | POA: Diagnosis not present

## 2020-02-26 NOTE — Telephone Encounter (Signed)
Follow Up: ° ° ° °Returning your call from yesterday. °

## 2020-02-26 NOTE — Telephone Encounter (Signed)
Patient with diagnosis of AVR on warfarin for anticoagulation.    Procedure: LEFT SMALL FINGER PINNING WITH POSSIBLE ORIF Date of procedure: TBD  CrCl 37mL/min  Per office protocol, patient can hold warfarin for 3 days prior to procedure.    Patient will not need bridging with Lovenox (enoxaparin) around procedure.

## 2020-02-26 NOTE — Patient Instructions (Addendum)
After Visit Summary:  We will be checking the following labs today - NONE   Medication Instructions:    Continue with your current medicines.    If you need a refill on your cardiac medications before your next appointment, please call your pharmacy.     Testing/Procedures To Be Arranged:  Echocardiogram - [place]  Follow-Up:   See Dr. Johnsie Cancel as planned in March of 2022.     At [facility], you and your health needs are our priority.  As part of our continuing mission to provide you with exceptional heart care, we have created designated Provider Care Teams.  These Care Teams include your primary Cardiologist (physician) and Advanced Practice Providers (APPs -  Physician Assistants and Nurse Practitioners) who all work together to provide you with the care you need, when you need it.  Special Instructions:  . Stay safe, wash your hands for at least 20 seconds and wear a mask when needed.  . It was good to talk with you today.  . I will send a note to the surgeon - please tell them tomorrow your last dose of Coumadin was on Sunday and this is ok by [place].    Call the [place] office at [phone] if you have any questions, problems or concerns.

## 2020-02-26 NOTE — Progress Notes (Signed)
CARDIOLOGY OFFICE NOTE  Date:  02/26/2020    Vanessa Owens Date of Birth: [birth year] Medical Record #[id number]  PCP:  [name], DO  Cardiologist:  Johnsie Cancel   Chief Complaint  Patient presents with  . Pre-op Exam    Seen for Dr. Johnsie Cancel    History of Present Illness: Vanessa Owens is a 59 y.o. female who presents today for a pre op clearance visit. Seen for Dr. Johnsie Cancel.   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 She has been on chronic coumadin. Echos done at [facility] due to cost. Prior normal GXT in 2018 for atypical chest pain.   Last seen in March by Dr. Johnsie Cancel - felt to be doing ok but had gotten more sedentary. Last echo from 2019. Was to have follow up echo this past June.   Had a fall earlier this month while hiking - has a non displaced fracture of the left 5th finger. She is to have pinning with possible ORIF tomorrow.   Comes in today. Here alone. She notes she has been doing well other than this recent injury. A 3 day hold on her Coumadin had been advised by Pharmacy here - no bridging required. Her last dose was Sunday night. She does bruise easily as a general rule. No chest pain. Not short of breath. No dizziness. No passing out. She feels like she has been doing ok.   Past Medical History:  Diagnosis Date  . Anemia   . Aortic stenosis     by Dr. Prescott Gum  . CHEST PAIN UNSPECIFIED    Qualifier: Diagnosis of  By: Burnett Harry MD, Shanon Brow    . Colon polyp   . Costochondritis    x2  . Diverticulosis   . IBS (irritable bowel syndrome)   . Long term current use of anticoagulant   . Migraine   . OAB (overactive bladder)   . Osteoporosis   . Rash   . S/P AVR    2001 for bicuspid AV.....Marland KitchenST JUDE  . Shingles    ? diag/rash 11/21/12  . Shortness of breath    Qualifier: Diagnosis of  By: Allean Found, LPN, [name]    . Vulvar ulceration    recurrent    Past Surgical History:  Procedure Laterality Date  . AORTIC VALVE  REPLACEMENT  2001   St Jude  . APPENDECTOMY     post op abdominal wall hematoma/2 transfusions  . BREAST BIOPSY  5/08   left breast  . CESAREAN SECTION       Medications: Current Meds  Medication Sig  . amoxicillin (AMOXIL) 500 MG capsule as directed.   . warfarin (COUMADIN) 7.5 MG tablet TAKE 1 TABLET BY MOUTH DAILY EVERY DAY OF THE WEEK EXCEPT FRIDAY TAKE 1 AND 1/2 TABLET ON FRIDAYS OR AS DIRECTED BY ANTICOAGULATION CLINIC.     Allergies: Allergies  Allergen Reactions  . Morphine And Related Nausea Only    Social History: The patient  reports that she has never smoked. She has never used smokeless tobacco. She reports current alcohol use of about 1.0 - 2.0 standard drink of alcohol per week. She reports that she does not use drugs.   Family History: The patient's family history includes Alzheimer's disease in her maternal grandfather; Diabetes in her brother, maternal grandfather, mother, and son; Hyperlipidemia in her brother and mother; Hypertension in her brother and mother.   Review of Systems: Please see the history  of present illness.   All other systems are reviewed and negative.   Physical Exam: VS:  BP 110/74   Pulse 78   Ht 5\' 1"  (1.549 m)   Wt 157 lb (71.2 kg)   LMP 05/18/2003   SpO2 99%   BMI 29.66 kg/m  .  BMI Body mass index is 29.66 kg/m.  Wt Readings from Last 3 Encounters:  02/26/20 157 lb (71.2 kg)  01/15/20 156 lb (70.8 kg)  08/09/19 160 lb (72.6 kg)    General: Pleasant. Alert and in no acute distress.   Cardiac: Regular rate and rhythm. Valve is crisp.  No edema.  Respiratory:  Lungs are clear to auscultation bilaterally with normal work of breathing.  GI: Soft and nontender.  MS: No deformity or atrophy. Gait and ROM intact.  Skin: Warm and dry. Color is normal.  Neuro:  Strength and sensation are intact and no gross focal deficits noted.  Psych: Alert, appropriate and with normal affect.   LABORATORY DATA:  EKG:  EKG is ordered  today.  Personally reviewed by me. This demonstrates NSR.  Lab Results  Component Value Date   WBC 5.2 04/03/2019   HGB 13.8 04/03/2019   HCT 40.0 04/03/2019   PLT 181 04/03/2019   GLUCOSE 97 04/03/2019   CHOL 251 (H) 04/03/2019   TRIG 133 04/03/2019   HDL 69 04/03/2019   LDLCALC 159 (H) 04/03/2019   ALT 13 04/03/2019   AST 20 04/03/2019   NA 139 04/03/2019   K 4.1 04/03/2019   CL 99 04/03/2019   CREATININE 0.71 04/03/2019   BUN 12 04/03/2019   CO2 27 04/03/2019   TSH 3.530 04/03/2019   INR 2.9 02/14/2020   HGBA1C 5.2 06/28/2013   Lab Results  Component Value Date   INR 2.9 02/14/2020   INR 2.4 01/04/2020   INR 3.3 (A) 11/22/2019   PROTIME 21.8 10/17/2008   PROTIME 18.0 10/10/2008     BNP (last 3 results) No results for input(s): BNP in the last 8760 hours.  ProBNP (last 3 results) No results for input(s): PROBNP in the last 8760 hours.   Other Studies Reviewed Today:  Echo Study Conclusions 10/2017  - Left ventricle: The cavity size was normal. Systolic function was  normal. The estimated ejection fraction was in the range of 55%  to 60%. Wall motion was normal; there were no regional wall  motion abnormalities. Features are consistent with a pseudonormal  left ventricular filling pattern, with concomitant abnormal  relaxation and increased filling pressure (grade 2 diastolic  dysfunction).  - Aortic valve: A mechanical prosthesis was present and functioning  normally. Mean gradient (S): 21 mm Hg. Peak gradient (S): 38 mm  Hg. Valve area (VTI): 1.35 cm^2. Valve area (Vmax): 1.18 cm^2.  Valve area (Vmean): 1.24 cm^2.  - Mitral valve: There was mild regurgitation directed centrally.  - Left atrium: The atrium was mildly dilated.   Impressions:   - Mildly increased prosthetic valve gradients and mild dilation of  the ascending aorta are unchanged from the previous study.   Assessment/Plan:  1. Pre op clearance - ok to proceed from  our standpoint. She has held her Coumadin for 2 days - discussed with Dr. Johney Frame (DOD) here - ok by [place] to proceed if ok with Dr. Caralyn Guile and anesthesia staff.   2. Prior AVR - remote - needs her echo updated - this will not hold up her clearance.    3. Anticoagulation - no problems noted.  Current medicines are reviewed with the patient today.  The patient does not have concerns regarding medicines other than what has been noted above.  The following changes have been made:  See above.  Labs/ tests ordered today include:    Orders Placed This Encounter  Procedures  . EKG 12-Lead  . ECHOCARDIOGRAM COMPLETE     Disposition:   FU with Dr. Johnsie Cancel as planned in March - she is felt to be doing well.     Patient is agreeable to this plan and will call if any problems develop in the interim.   SignedTruitt Merle, NP  02/26/2020 3:28 PM  [facility] [address] [place], [place]  [address] Phone: [phone] Fax: [phone]

## 2020-02-26 NOTE — Telephone Encounter (Signed)
Pt has appt with Cecilie Kicks, NP 02/29/20. Will forward clearance notes to NP for upcoming appt. Will remove from the pre op call back pool. Will send FYI to requesting office pt has appt.

## 2020-02-27 ENCOUNTER — Telehealth: Payer: Self-pay | Admitting: Pharmacist

## 2020-02-27 NOTE — Telephone Encounter (Signed)
Patient called, had surgery this morning.  Has held warfarin for 3 days.  Advised to restart warfarin tonight and scheduled INR check for 10/22

## 2020-02-29 ENCOUNTER — Ambulatory Visit: Payer: No Typology Code available for payment source | Admitting: Cardiology

## 2020-03-07 ENCOUNTER — Ambulatory Visit (INDEPENDENT_AMBULATORY_CARE_PROVIDER_SITE_OTHER): Payer: No Typology Code available for payment source | Admitting: Pharmacist

## 2020-03-07 ENCOUNTER — Other Ambulatory Visit: Payer: Self-pay

## 2020-03-07 DIAGNOSIS — Z952 Presence of prosthetic heart valve: Secondary | ICD-10-CM | POA: Diagnosis not present

## 2020-03-07 DIAGNOSIS — Z7901 Long term (current) use of anticoagulants: Secondary | ICD-10-CM | POA: Diagnosis not present

## 2020-03-07 LAB — POCT INR: INR: 3.4 — AB (ref 2.0–3.0)

## 2020-03-07 NOTE — Patient Instructions (Signed)
Continue taking 1 tablet daily except 1.5 tablets on Fridays. Recheck INR in 6 weeks.  Call clinic [phone] with any concerns.

## 2020-03-09 ENCOUNTER — Other Ambulatory Visit: Payer: Self-pay | Admitting: Obstetrics & Gynecology

## 2020-03-09 DIAGNOSIS — M810 Age-related osteoporosis without current pathological fracture: Secondary | ICD-10-CM

## 2020-03-10 NOTE — Telephone Encounter (Signed)
Medication refill request: Actonel 35mg   Last AEX:  09/01/18  Next AEX: 06/27/20 Last MMG (if hormonal medication request): 06/07/19  Neg  Refill authorized: 12/1   Pt requesting 90 day supply

## 2020-03-18 ENCOUNTER — Other Ambulatory Visit: Payer: Self-pay

## 2020-03-18 ENCOUNTER — Ambulatory Visit (HOSPITAL_COMMUNITY): Payer: No Typology Code available for payment source | Attending: Cardiovascular Disease

## 2020-03-18 DIAGNOSIS — Z952 Presence of prosthetic heart valve: Secondary | ICD-10-CM | POA: Insufficient documentation

## 2020-03-18 LAB — ECHOCARDIOGRAM COMPLETE
AR max vel: 1.05 cm2
AV Area VTI: 1.31 cm2
AV Area mean vel: 0.99 cm2
AV Mean grad: 26 mmHg
AV Peak grad: 40.2 mmHg
Ao pk vel: 3.17 m/s
Area-P 1/2: 3.81 cm2
P 1/2 time: 315 msec
S' Lateral: 2.3 cm

## 2020-04-18 ENCOUNTER — Ambulatory Visit (INDEPENDENT_AMBULATORY_CARE_PROVIDER_SITE_OTHER): Payer: No Typology Code available for payment source | Admitting: *Deleted

## 2020-04-18 ENCOUNTER — Other Ambulatory Visit: Payer: Self-pay

## 2020-04-18 DIAGNOSIS — Z952 Presence of prosthetic heart valve: Secondary | ICD-10-CM | POA: Diagnosis not present

## 2020-04-18 DIAGNOSIS — Z7901 Long term (current) use of anticoagulants: Secondary | ICD-10-CM | POA: Diagnosis not present

## 2020-04-18 LAB — POCT INR: INR: 4.1 — AB (ref 2.0–3.0)

## 2020-04-18 NOTE — Patient Instructions (Signed)
Description   Do not take any Warfarin today then continue taking 1 tablet daily except 1.5 tablets on Fridays. Recheck INR in 5 weeks.  Call clinic [phone] with any concerns.

## 2020-05-18 ENCOUNTER — Other Ambulatory Visit: Payer: Self-pay | Admitting: Cardiovascular Disease

## 2020-05-21 ENCOUNTER — Ambulatory Visit: Payer: No Typology Code available for payment source | Admitting: Obstetrics & Gynecology

## 2020-05-23 ENCOUNTER — Ambulatory Visit (INDEPENDENT_AMBULATORY_CARE_PROVIDER_SITE_OTHER): Payer: No Typology Code available for payment source

## 2020-05-23 ENCOUNTER — Other Ambulatory Visit: Payer: Self-pay

## 2020-05-23 DIAGNOSIS — Z7901 Long term (current) use of anticoagulants: Secondary | ICD-10-CM

## 2020-05-23 DIAGNOSIS — Z952 Presence of prosthetic heart valve: Secondary | ICD-10-CM

## 2020-05-23 LAB — POCT INR: INR: 5.3 — AB (ref 2.0–3.0)

## 2020-05-23 NOTE — Patient Instructions (Signed)
Description   Skip today and tomorrow's dosage of Warfarin, then start taking 1 tablet daily.  Recheck INR in 2 weeks.  Call clinic [phone] with any concerns.

## 2020-06-06 ENCOUNTER — Other Ambulatory Visit: Payer: Self-pay

## 2020-06-06 ENCOUNTER — Ambulatory Visit (INDEPENDENT_AMBULATORY_CARE_PROVIDER_SITE_OTHER): Payer: No Typology Code available for payment source | Admitting: *Deleted

## 2020-06-06 DIAGNOSIS — Z7901 Long term (current) use of anticoagulants: Secondary | ICD-10-CM

## 2020-06-06 DIAGNOSIS — Z952 Presence of prosthetic heart valve: Secondary | ICD-10-CM | POA: Diagnosis not present

## 2020-06-06 LAB — POCT INR: INR: 2.6 (ref 2.0–3.0)

## 2020-06-06 NOTE — Patient Instructions (Signed)
Description   Today and tomorrow take 1.5 tablets of Warfarin then continue taking 1 tablet daily.  Recheck INR in 3 weeks.  Call clinic [phone] with any concerns.

## 2020-06-27 ENCOUNTER — Ambulatory Visit: Payer: 59

## 2020-06-30 ENCOUNTER — Other Ambulatory Visit: Payer: Self-pay

## 2020-06-30 ENCOUNTER — Ambulatory Visit (INDEPENDENT_AMBULATORY_CARE_PROVIDER_SITE_OTHER): Payer: No Typology Code available for payment source

## 2020-06-30 DIAGNOSIS — Z952 Presence of prosthetic heart valve: Secondary | ICD-10-CM | POA: Diagnosis not present

## 2020-06-30 DIAGNOSIS — Z5181 Encounter for therapeutic drug level monitoring: Secondary | ICD-10-CM

## 2020-06-30 DIAGNOSIS — Z7901 Long term (current) use of anticoagulants: Secondary | ICD-10-CM | POA: Diagnosis not present

## 2020-06-30 LAB — POCT INR: INR: 3.1 — AB (ref 2.0–3.0)

## 2020-06-30 NOTE — Patient Instructions (Signed)
-   continue taking 1 tablet daily.   - Recheck INR in 4 weeks.   Call clinic [phone] with any concerns.

## 2020-07-11 NOTE — Progress Notes (Incomplete)
CARDIOLOGY OFFICE NOTE  Date:  07/11/2020    Vanessa Owens Date of Birth: Aug 09, 1960 Medical Record #[id number]  PCP:  [name], DO  Cardiologist:  Johnsie Cancel    History of Present Illness: 60 y.o.  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 She has been on chronic coumadin. Echos done at [facility] due to cost. Prior normal GXT in 2018 for atypical chest pain.    Had a fall October 2021 while hiking - non displaced fracture of the left 5th finger. Held coumadin for surgery    Echo 03/18/20 EF 60-65% mean gradient 25 peak 40 DVI .42 AVA 1.1 cm2 With mild AR similar to TTE 10/25/17  ***  Past Medical History:  Diagnosis Date  . Anemia   . Aortic stenosis     by Dr. Prescott Gum  . CHEST PAIN UNSPECIFIED    Qualifier: Diagnosis of  By: Burnett Harry MD, Shanon Brow    . Colon polyp   . Costochondritis    x2  . Diverticulosis   . IBS (irritable bowel syndrome)   . Long term current use of anticoagulant   . Migraine   . OAB (overactive bladder)   . Osteoporosis   . Rash   . S/P AVR    2001 for bicuspid AV.....Marland KitchenST JUDE  . Shingles    ? diag/rash 11/21/12  . Shortness of breath    Qualifier: Diagnosis of  By: Allean Found, LPN, [name]    . Vulvar ulceration    recurrent    Past Surgical History:  Procedure Laterality Date  . AORTIC VALVE REPLACEMENT  2001   St Jude  . APPENDECTOMY     post op abdominal wall hematoma/2 transfusions  . BREAST BIOPSY  5/08   left breast  . CESAREAN SECTION       Medications: No outpatient medications have been marked as taking for the 07/17/20 encounter (Appointment) with Josue Hector, MD.     Allergies: Allergies  Allergen Reactions  . Morphine And Related Nausea Only    Social History: The patient  reports that she has never smoked. She has never used smokeless tobacco. She reports current alcohol use of about 1.0 - 2.0 standard drink of alcohol per week. She reports that she does not use drugs.    Family History: The patient's family history includes Alzheimer's disease in her maternal grandfather; Diabetes in her brother, maternal grandfather, mother, and son; Hyperlipidemia in her brother and mother; Hypertension in her brother and mother.   Review of Systems: Please see the history of present illness.   All other systems are reviewed and negative.   Physical Exam: VS:  LMP 05/18/2003  .  BMI There is no height or weight on file to calculate BMI.  Wt Readings from Last 3 Encounters:  02/26/20 71.2 kg  01/15/20 70.8 kg  08/09/19 72.6 kg   Affect appropriate Healthy:  appears stated age 80: normal Neck supple with no adenopathy JVP normal no bruits no thyromegaly Lungs clear with no wheezing and good diaphragmatic motion Heart:  T0/Z6 click SEM no rub, gallop or click PMI normal post sternotomy  Abdomen: benighn, BS positve, no tenderness, no AAA no bruit.  No HSM or HJR Distal pulses intact with no bruits No edema Neuro non-focal Skin warm and dry No muscular weakness    LABORATORY DATA:  EKG:  SR rate 78 LAE poor R wave progression 02/26/20   Lab Results  Component  Value Date   WBC 5.2 04/03/2019   HGB 13.8 04/03/2019   HCT 40.0 04/03/2019   PLT 181 04/03/2019   GLUCOSE 97 04/03/2019   CHOL 251 (H) 04/03/2019   TRIG 133 04/03/2019   HDL 69 04/03/2019   LDLCALC 159 (H) 04/03/2019   ALT 13 04/03/2019   AST 20 04/03/2019   NA 139 04/03/2019   K 4.1 04/03/2019   CL 99 04/03/2019   CREATININE 0.71 04/03/2019   BUN 12 04/03/2019   CO2 27 04/03/2019   TSH 3.530 04/03/2019   INR 3.1 (A) 06/30/2020   HGBA1C 5.2 06/28/2013   Lab Results  Component Value Date   INR 3.1 (A) 06/30/2020   INR 2.6 06/06/2020   INR 5.3 (A) 05/23/2020   PROTIME 21.8 10/17/2008   PROTIME 18.0 10/10/2008     BNP (last 3 results) No results for input(s): BNP in the last 8760 hours.  ProBNP (last 3 results) No results for input(s): PROBNP in the last 8760  hours.   Other Studies Reviewed   Echo Study Conclusions  03/18/20 IMPRESSIONS    1. Left ventricular ejection fraction, by estimation, is 60 to 65%. The  left ventricle has normal function. The left ventricle has no regional  wall motion abnormalities. There is mild concentric left ventricular  hypertrophy. Left ventricular diastolic  parameters are consistent with Grade II diastolic dysfunction  (pseudonormalization). Elevated left ventricular end-diastolic pressure.  2. Right ventricular systolic function is normal. The right ventricular  size is normal. There is normal pulmonary artery systolic pressure.  3. The mitral valve is normal in structure. No evidence of mitral valve  regurgitation. No evidence of mitral stenosis.  4. Mechanical aortic valve gradients are moderately elevated. Mean  prosthetic aortic valve gradient has increased slightly from 21 mmHg  10/2017 to 26 mmHg now. The aortic valve has been repaired/replaced. Aortic  valve regurgitation is mild. No aortic  stenosis is present. Procedure Date: 2001. Aortic valve area, by VTI  measures 1.31 cm. Aortic valve mean gradient measures 26.0 mmHg. Aortic  valve Vmax measures 3.17 m/s.  5. Aortic dilatation noted. There is mild dilatation of the ascending  aorta, measuring 38 mm.  6. The inferior vena cava is dilated in size with <50% respiratory  variability, suggesting right atrial pressure of 15 mmHg.    Assessment/Plan:  1. AVR -  No change on exam stable high gradients SBE ***  3. Anticoagulation -  No bleeding issues INR 3.1 06/30/20   Current medicines are reviewed with the patient today.  The patient does not have concerns regarding medicines other than what has been noted above.  The following changes have been made:  See above.  Labs/ tests ordered today include:    No orders of the defined types were placed in this encounter.    Disposition:   FU  1 year .   Signed: [name], MD   07/11/2020 8:15 AM  [facility] [address] [place], [place]  [address] Phone: [phone] Fax: [phone]

## 2020-07-17 ENCOUNTER — Ambulatory Visit: Payer: No Typology Code available for payment source | Admitting: Cardiovascular Disease

## 2020-07-21 ENCOUNTER — Ambulatory Visit: Payer: No Typology Code available for payment source | Admitting: Cardiovascular Disease

## 2020-07-31 ENCOUNTER — Ambulatory Visit (INDEPENDENT_AMBULATORY_CARE_PROVIDER_SITE_OTHER): Payer: No Typology Code available for payment source | Admitting: *Deleted

## 2020-07-31 ENCOUNTER — Other Ambulatory Visit: Payer: Self-pay

## 2020-07-31 DIAGNOSIS — Z7901 Long term (current) use of anticoagulants: Secondary | ICD-10-CM

## 2020-07-31 LAB — POCT INR: INR: 2.4 (ref 2.0–3.0)

## 2020-07-31 NOTE — Patient Instructions (Signed)
Description    Take warfarin 1.5 tablet today and then continue to take 1 tablet daily. Recheck INR in 3 weeks. Coumadin Clinic. [phone].

## 2020-08-19 ENCOUNTER — Other Ambulatory Visit: Payer: Self-pay | Admitting: Cardiovascular Disease

## 2020-09-03 ENCOUNTER — Ambulatory Visit (INDEPENDENT_AMBULATORY_CARE_PROVIDER_SITE_OTHER): Payer: No Typology Code available for payment source | Admitting: *Deleted

## 2020-09-03 ENCOUNTER — Other Ambulatory Visit: Payer: Self-pay

## 2020-09-03 DIAGNOSIS — Z7901 Long term (current) use of anticoagulants: Secondary | ICD-10-CM | POA: Diagnosis not present

## 2020-09-03 LAB — POCT INR: INR: 3.3 — AB (ref 2.0–3.0)

## 2020-09-03 NOTE — Patient Instructions (Signed)
Description   Continue to take 1 tablet daily. Recheck INR in 4 weeks. Coumadin Clinic. [phone].

## 2020-09-19 ENCOUNTER — Encounter: Payer: Self-pay | Admitting: Gastroenterology

## 2020-09-19 ENCOUNTER — Other Ambulatory Visit: Payer: Self-pay | Admitting: Obstetrics & Gynecology

## 2020-09-19 DIAGNOSIS — Z1231 Encounter for screening mammogram for malignant neoplasm of breast: Secondary | ICD-10-CM

## 2020-10-01 ENCOUNTER — Ambulatory Visit (INDEPENDENT_AMBULATORY_CARE_PROVIDER_SITE_OTHER): Payer: No Typology Code available for payment source | Admitting: *Deleted

## 2020-10-01 ENCOUNTER — Other Ambulatory Visit: Payer: Self-pay

## 2020-10-01 DIAGNOSIS — Z952 Presence of prosthetic heart valve: Secondary | ICD-10-CM | POA: Diagnosis not present

## 2020-10-01 DIAGNOSIS — Z7901 Long term (current) use of anticoagulants: Secondary | ICD-10-CM | POA: Diagnosis not present

## 2020-10-01 LAB — POCT INR: INR: 4.3 — AB (ref 2.0–3.0)

## 2020-10-01 NOTE — Patient Instructions (Signed)
Description   Do not take any Warfarin today then continue taking 1 tablet daily. Recheck INR in 3 weeks. Coumadin Clinic. [phone].

## 2020-10-22 ENCOUNTER — Other Ambulatory Visit: Payer: Self-pay

## 2020-10-22 ENCOUNTER — Ambulatory Visit (INDEPENDENT_AMBULATORY_CARE_PROVIDER_SITE_OTHER): Payer: No Typology Code available for payment source

## 2020-10-22 DIAGNOSIS — Z7901 Long term (current) use of anticoagulants: Secondary | ICD-10-CM | POA: Diagnosis not present

## 2020-10-22 DIAGNOSIS — Z952 Presence of prosthetic heart valve: Secondary | ICD-10-CM

## 2020-10-22 LAB — POCT INR: INR: 3.5 — AB (ref 2.0–3.0)

## 2020-10-22 NOTE — Patient Instructions (Signed)
Description   Continue on same dosage 1 tablet daily. Recheck INR in 4 weeks. Coumadin Clinic. [phone].

## 2020-10-28 ENCOUNTER — Ambulatory Visit (INDEPENDENT_AMBULATORY_CARE_PROVIDER_SITE_OTHER): Payer: No Typology Code available for payment source | Admitting: Gastroenterology

## 2020-10-28 ENCOUNTER — Telehealth: Payer: Self-pay

## 2020-10-28 ENCOUNTER — Other Ambulatory Visit: Payer: Self-pay

## 2020-10-28 ENCOUNTER — Encounter: Payer: Self-pay | Admitting: Gastroenterology

## 2020-10-28 VITALS — BP 120/70 | HR 72 | Ht 60.0 in | Wt 149.5 lb

## 2020-10-28 DIAGNOSIS — Z86010 Personal history of colonic polyps: Secondary | ICD-10-CM

## 2020-10-28 DIAGNOSIS — Z7902 Long term (current) use of antithrombotics/antiplatelets: Secondary | ICD-10-CM | POA: Diagnosis not present

## 2020-10-28 DIAGNOSIS — R140 Abdominal distension (gaseous): Secondary | ICD-10-CM

## 2020-10-28 MED ORDER — PLENVU 140 G PO SOLR: 140.0000 g | ORAL | 0 refills | Status: DC

## 2020-10-28 NOTE — Patient Instructions (Signed)
If you are age 60 or older, your body mass index should be between 23-30. Your Body mass index is 29.2 kg/m. If this is out of the aforementioned range listed, please consider follow up with your Primary Care Provider.  If you are age 35 or younger, your body mass index should be between 19-25. Your Body mass index is 29.2 kg/m. If this is out of the aformentioned range listed, please consider follow up with your Primary Care Provider.    Food Guidelines for those with chronic digestive trouble:  Many people have difficulty digesting certain foods, causing a variety of distressing and embarrassing symptoms such as abdominal pain, bloating and gas.  These foods may need to be avoided or consumed in small amounts.  Here are some tips that might be helpful for you.  1.   Lactose intolerance is the difficulty or complete inability to digest lactose, the natural sugar in milk and anything made from milk.  This condition is harmless, common, and can begin any time during life.  Some people can digest a modest amount of lactose while others cannot tolerate any.  Also, not all dairy products contain equal amounts of lactose.  For example, hard cheeses such as parmesan have less lactose than soft cheeses such as cheddar.  Yogurt has less lactose than milk or cheese.  Many packaged foods (even many brands of bread) have milk, so read ingredient lists carefully.  It is difficult to test for lactose intolerance, so just try avoiding lactose as much as possible for a week and see what happens with your symptoms.  If you seem to be lactose intolerant, the best plan is to avoid it (but make sure you get calcium from another source).  The next best thing is to use lactase enzyme supplements, available over the counter everywhere.  Just know that many lactose intolerant people need to take several tablets with each serving of dairy to avoid symptoms.  Lastly, a lot of restaurant food is made with milk or butter.  Many are  things you might not suspect, such as mashed potatoes, rice and pasta (cooked with butter) and "grilled" items.  If you are lactose intolerant, it never hurts to ask your server what has milk or butter.  2.   Fiber is an important part of your diet, but not all fiber is well-tolerated.  Insoluble fiber such as bran is often consumed by normal gut bacteria and converted into gas.  Soluble fiber such as oats, squash, carrots and green beans are typically tolerated better.  3.   Some types of carbohydrates can be poorly digested.  Examples include: fructose (apples, cherries, pears, raisins and other dried fruits), fructans (onions, zucchini, large amounts of wheat), sorbitol/mannitol/xylitol and sucralose/Splenda (common artificial sweeteners), and raffinose (lentils, broccoli, cabbage, asparagus, brussel sprouts, many types of beans).  Do a Development worker, community for [employer] and you will find helpful information. Beano, a dietary supplement, will often help with raffinose-containing foods.  As with lactase tablets, you may need several per serving.  4.   Whenever possible, avoid processed food&meats and chemical additives.  High fructose corn syrup, a common sweetener, may be difficult to digest.  Eggs and soy (comes from the soybean, and added to many foods now) are other common bloating/gassy foods.  5.  Regarding gluten:  gluten is a protein mainly found in wheat, but also rye and barley.  There is a condition called celiac sprue, which is an inflammatory reaction in the small  intestine causing a variety of digestive symptoms.  Blood testing is highly reliable to look for this condition, and sometimes upper endoscopy with small bowel biopsies may be necessary to make the diagnosis.  Many patients who test negative for celiac sprue report improvement in their digestive symptoms when they switch to a gluten-free diet.  However, in these "non-celiac gluten sensitive" patients, the true role of gluten in their  symptoms is unclear.  Reducing carbohydrates in general may decrease the gas and bloating caused when gut bacteria consume carbs. Also, some of these patients may actually be intolerant of the baker's yeast in bread products rather than the gluten.  Flatbread and other reduced yeast breads might therefore be tolerated.  There is no specific testing available for most food intolerances, which are discovered mainly by dietary elimination.  Please do not embark on a gluten free diet unless directed by your doctor, as it is highly restrictive, and may lead to nutritional deficiencies if not carefully monitored.  Lastly, beware of internet claims offering "personalized" tests for food intolerances.  Such testing has no reliable scientific evidence to support its reliability and correlation to symptoms.    6.  The best advice is old advice, especially for those with chronic digestive trouble - try to eat "clean".  Balanced diet, avoid processed food, plenty of fruits and vegetables, cut down the sugar, minimal alcohol, avoid tobacco. Make time to care for yourself, get enough sleep, exercise when you can, reduce stress.  Your guts will thank you for it.   - Dr. [facility]  __________________________________________________________  The [facility] providers would like to encourage you to use [facility] to communicate with providers for non-urgent requests or questions.  Due to long hold times on the telephone, sending your provider a message by [facility] may be a faster and more efficient way to get a response.  Please allow 48 business hours for a response.  Please remember that this is for non-urgent requests.   You have been scheduled for a colonoscopy. Please follow written instructions given to you at your visit today.  Please pick up your prep supplies at the pharmacy within the next 1-3 days. If you use inhalers (even only as needed), please bring them with you on the day of your  procedure.  It was a pleasure to see you today!  Thank you for trusting me with your gastrointestinal care!

## 2020-10-28 NOTE — Telephone Encounter (Signed)
Patient with diagnosis of mechanical AVR on warfarin for anticoagulation.    Procedure: colonoscopy Date of procedure: 12/12/20  Per office protocol, patient can hold warfarin for 5 days prior to procedure.   Patient does NOT need bridging with Lovenox (enoxaparin) around procedure.

## 2020-10-28 NOTE — Telephone Encounter (Signed)
[facility] Pre-operative Risk Assessment     Request for surgical clearance:     Endoscopy Procedure  What type of surgery is being performed?     Colonoscopy  When is this surgery scheduled?     12-12-2020  What type of clearance is required ?   Pharmacy  Are there any medications that need to be held prior to surgery and how long? Yes, Coumadin, 5 days   Practice name and name of physician performing surgery?      [facility]  What is your office phone and fax number?      Phone- [phone]  [phone]  Anesthesia type (None, local, MAC, general) ?       MAC

## 2020-10-28 NOTE — Progress Notes (Signed)
[facility] Consult Note:  History: Vanessa Owens 10/28/2020  Referring provider: Carollee Herter, Alferd Apa, DO  Reason for consult/chief complaint: hx of  colon polyps (Pt on Coumadin)   Subjective  HPI:  This is a very pleasant 60 year old woman who contacted [place] to schedule colonoscopy.  She was brought for clinic visit due to her medical conditions [name] had a colonoscopy with Dr. [name] in July 2015.  Diverticulosis was noted, and there was a 3 mm cecal tubular adenoma removed.  In addition, he described a "3 mm area of abnormal mucosa" in the ascending colon.  (From the photograph, it looks larger than that), this was biopsied.  Pathology returned as sessile serrated polyp, and Dr. [name] recommended a recall colonoscopy in 5 years. Dr. [name] saw her in 2016 for IBS treated with Viberzi (100 mg does cause constipation, so decreased to 75 mg).  She was also having some internal hemorrhoidal bleeding, and he recommended Anusol suppository.  [name] tells me that her bowel habits are generally regular, she has some intermittent bloating gas or loose stool that she attributes to certain foods.  She has not had hemorrhoidal bleeding in at least a year. Her overall health has been good, she has recently been able to lose about 20 pounds through diet changes and exercise, although this is challenging because her vegetable consumption is limited by Coumadin diet. ROS:  Review of Systems  Constitutional:  Negative for appetite change and unexpected weight change.  HENT:  Negative for mouth sores and voice change.   Eyes:  Negative for pain and redness.  Respiratory:  Negative for cough and shortness of breath.   Cardiovascular:  Negative for chest pain and palpitations.  Genitourinary:  Positive for frequency. Negative for dysuria and hematuria.  Musculoskeletal:  Negative for arthralgias and myalgias.  Skin:  Negative for pallor and rash.  Neurological:  Negative for  weakness and headaches.  Hematological:  Negative for adenopathy.    Past Medical History: Past Medical History:  Diagnosis Date   Anemia    Anxiety    Aortic stenosis     by Dr. Prescott Gum   CHEST PAIN UNSPECIFIED    Qualifier: Diagnosis of  By: Burnett Harry MD, Shanon Brow     Colon polyp    Costochondritis    x2   Depression    Diverticulosis    GERD (gastroesophageal reflux disease)    Glaucoma (increased eye pressure)    HLD (hyperlipidemia)    IBS (irritable bowel syndrome)    Long term current use of anticoagulant    Migraine    OAB (overactive bladder)    Osteoporosis    Rash    S/P AVR    2001 for bicuspid AV.....Marland KitchenST JUDE   Shingles    ? diag/rash 11/21/12   Shortness of breath    Qualifier: Diagnosis of  By: York, LPN, [name]     Vulvar ulceration    recurrent   Last cardiology office note from 02/26/2020 reports mechanical AVR in 2001 for bicuspid aortic valve.  Maintained on Coumadin afterward. Negative exercise tolerance test March 2018, reportedly done for atypical chest pain.  Past Surgical History: Past Surgical History:  Procedure Laterality Date   AORTIC VALVE REPLACEMENT  05/18/1999   St Jude   APPENDECTOMY     post op abdominal wall hematoma/2 transfusions   BREAST BIOPSY Left 09/15/2006   left breast   CESAREAN SECTION     x 2   FINGER FRACTURE  SURGERY Left    pinky     Family History: Family History  Problem Relation Age of Onset   Hypertension Mother    Diabetes Mother    Hyperlipidemia Mother    Irritable bowel syndrome Mother    Diabetes Brother    Hyperlipidemia Brother    Hypertension Brother    Heart disease Maternal Grandmother    Stroke Maternal Grandmother    Alzheimer's disease Maternal Grandfather    Diabetes Maternal Grandfather    Diabetes Son    Pancreatic cancer Maternal Aunt    Colon cancer Maternal Great-grandmother    Kidney disease Maternal Uncle     Social History: Social History   Socioeconomic History    Marital status: Single    Spouse name: Not on file   Number of children: 2   Years of education: Not on file   Highest education level: Not on file  Occupational History   Occupation: benefits specialist    Employer: [employer]  Tobacco Use   Smoking status: Never   Smokeless tobacco: Never  Vaping Use   Vaping Use: Never used  Substance and Sexual Activity   Alcohol use: Yes    Alcohol/week: 1.0 - 2.0 standard drink    Types: 1 - 2 Standard drinks or equivalent per week    Comment: 1-2 per day   Drug use: No   Sexual activity: Yes    Partners: Male    Birth control/protection: Post-menopausal  Other Topics Concern   Not on file  Social History Narrative   Not on file   Social Determinants of Health   Financial Resource Strain: Not on file  Food Insecurity: Not on file  Transportation Needs: Not on file  Physical Activity: Not on file  Stress: Not on file  Social Connections: Not on file    Allergies: Allergies  Allergen Reactions   Morphine And Related Nausea Only    Outpatient Meds: Current Outpatient Medications  Medication Sig Dispense Refill   warfarin (COUMADIN) 7.5 MG tablet TAKE 1 TABLET BY MOUTH DAILY OR AS DIRECTED BY ANTICOAGULATION CLINIC. 100 tablet 0   amoxicillin (AMOXIL) 500 MG capsule as directed.  (Patient not taking: Reported on 10/28/2020)  1   risedronate (ACTONEL) 35 MG tablet TAKE 1 TABLET (35 MG TOTAL) BY MOUTH EVERY 7 (SEVEN) DAYS. WITH WATER ON EMPTY STOMACH, NOTHING BY MOUTH OR LIE DOWN FOR NEXT 30 MINUTES. (Patient not taking: Reported on 10/28/2020) 12 tablet 1   No current facility-administered medications for this visit.      ___________________________________________________________________ Objective   Exam:  BP 120/70 (BP Location: Left Arm, Patient Position: Sitting, Cuff Size: Normal)   Pulse 72   Ht 5' (1.524 m) Comment: height measured without shoes  Wt 149 lb 8 oz (67.8 kg)   LMP 05/18/2003   BMI 29.20 kg/m   Wt Readings from Last 3 Encounters:  10/28/20 149 lb 8 oz (67.8 kg)  02/26/20 157 lb (71.2 kg)  01/15/20 156 lb (70.8 kg)    General: Well-appearing Eyes: sclera anicteric, no redness ENT: oral mucosa moist without lesions, no cervical or supraclavicular lymphadenopathy CV: Regular rhythm, valve click no JVD, no peripheral edema Resp: clear to auscultation bilaterally, normal RR and effort noted GI: soft, no tenderness, with active bowel sounds. No guarding or palpable organomegaly noted. Skin; warm and dry, no rash or jaundice noted Neuro: awake, alert and oriented x 3. Normal gross motor function and fluent speech  Other Last echocardiogram November 2021  1. Left ventricular ejection fraction, by estimation, is 60 to 65%. The  left ventricle has normal function. The left ventricle has no regional  wall motion abnormalities. There is mild concentric left ventricular  hypertrophy. Left ventricular diastolic  parameters are consistent with Grade II diastolic dysfunction  (pseudonormalization). Elevated left ventricular end-diastolic pressure.   2. Right ventricular systolic function is normal. The right ventricular  size is normal. There is normal pulmonary artery systolic pressure.   3. The mitral valve is normal in structure. No evidence of mitral valve  regurgitation. No evidence of mitral stenosis.   4. Mechanical aortic valve gradients are moderately elevated. Mean  prosthetic aortic valve gradient has increased slightly from 21 mmHg  10/2017 to 26 mmHg now. The aortic valve has been repaired/replaced. Aortic  valve regurgitation is mild. No aortic  stenosis is present. Procedure Date: 2001. Aortic valve area, by VTI  measures 1.31 cm. Aortic valve mean gradient measures 26.0 mmHg. Aortic  valve Vmax measures 3.17 m/s.   5. Aortic dilatation noted. There is mild dilatation of the ascending  aorta, measuring 38 mm.   6. The inferior vena cava is dilated in size with <50%  respiratory  variability, suggesting right atrial pressure of 15 mmHg.    Assessment: Encounter Diagnoses  Name Primary?   Personal history of colonic polyps Yes   Long term (current) use of antithrombotics/antiplatelets    Abdominal bloating     Both adenomatous and serrated polyps removed in 2015, and it is also not clear if the serrated polyp was just biopsied or completely removed.  Therefore, there may be increased risk of residual or recurrent polyp in that area. [name] had gotten a recall notice, but did not feel comfortable having a colonoscopy during the pandemic.  She is now ready to schedule, we discussed the nature of the procedure including risks and benefits  The benefits and risks of the planned procedure were described in detail with the patient or (when appropriate) their health care proxy.  Risks were outlined as including, but not limited to, bleeding, infection, perforation, adverse medication reaction leading to cardiac or pulmonary decompensation, pancreatitis (if ERCP).  The limitation of incomplete mucosal visualization was also discussed.  No guarantees or warranties were given.  She needs a Coumadin hold prior to procedure, and we will communicate directly with anticoagulation clinic on this.  Fortunately, she did not require bridging Lovenox prior to an orthopedic procedure last year, and hopefully would not again.  Nevertheless, it would be helpful for any coag clinic to check her INR perhaps a week or so before the procedure and then make recommendations on how many days to hold it.  We would then most likely resume the medicine the evening of the colonoscopy. Nevertheless, post polypectomy bleeding risks are higher in such patients, something which I made her aware of.  She carries a diagnosis of IBS, not clear if it is that or perhaps food intolerance/maldigestion of some kind.  I gave her some written dietary advice on this  Thank you for the courtesy of this  consult.  Please call me with any questions or concerns.  Nelida Meuse III  CC: Referring provider noted above

## 2020-10-28 NOTE — Telephone Encounter (Signed)
    Vanessa Owens DOB:  January 19, 1961  MRN:  [id number]   Primary Cardiologist: [name], MD  Chart reviewed as part of pre-operative protocol coverage. Given past medical history and time since last visit, based on ACC/AHA guidelines, Vanessa Owens would be at acceptable risk for the planned procedure without further cardiovascular testing.   Patient with diagnosis of mechanical AVR on warfarin for anticoagulation.     Procedure: colonoscopy Date of procedure: 12/12/20   Per office protocol, patient can hold warfarin for 5 days prior to procedure. Patient does NOT need bridging with Lovenox (enoxaparin) around procedure.  I will route this recommendation to the requesting party via Epic fax function and remove from pre-op pool.  Please call with questions.  Kathyrn Drown, NP 10/28/2020, 1:10 PM

## 2020-10-29 NOTE — Telephone Encounter (Signed)
Patient has been notified and aware to hold the Coumadin for 5 days prior to the procedure

## 2020-11-14 ENCOUNTER — Inpatient Hospital Stay: Admission: RE | Admit: 2020-11-14 | Payer: No Typology Code available for payment source | Source: Ambulatory Visit

## 2020-11-14 DIAGNOSIS — Z1231 Encounter for screening mammogram for malignant neoplasm of breast: Secondary | ICD-10-CM

## 2020-11-15 ENCOUNTER — Other Ambulatory Visit: Payer: Self-pay | Admitting: Cardiovascular Disease

## 2020-11-16 IMAGING — MG DIGITAL SCREENING BILAT W/ TOMO W/ CAD
6 of 10 series · 6 of 30 positions shown · non-contrast
Comparison: Previous exam(s).

CLINICAL DATA: Screening.

EXAM:
DIGITAL SCREENING BILATERAL MAMMOGRAM WITH TOMO AND CAD

[L CC synth-2D]
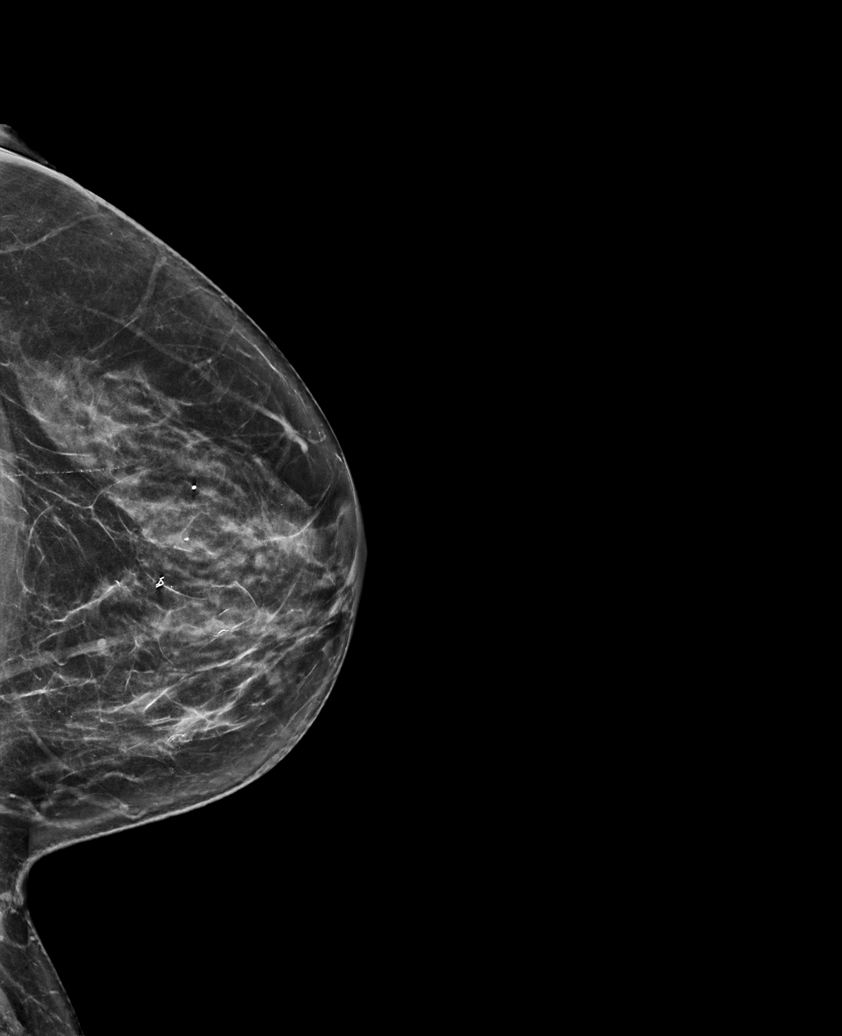

[R CC synth-2D]
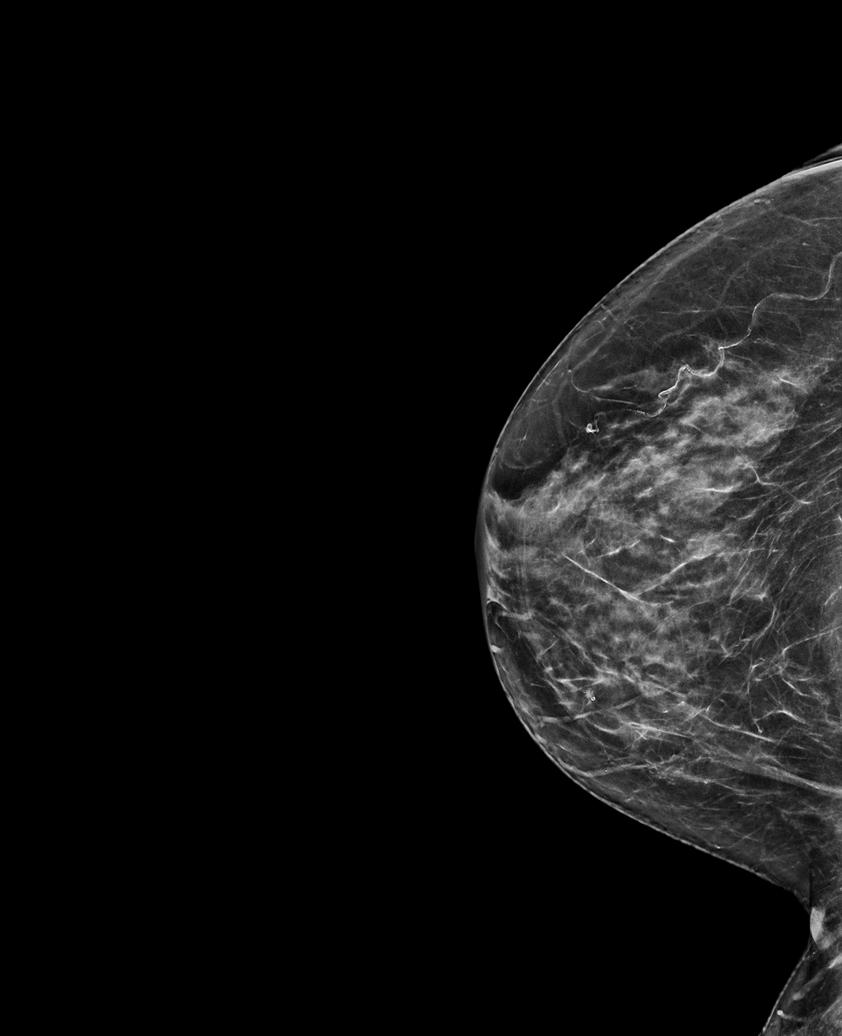

[L MLO synth-2D (1 of 2)]
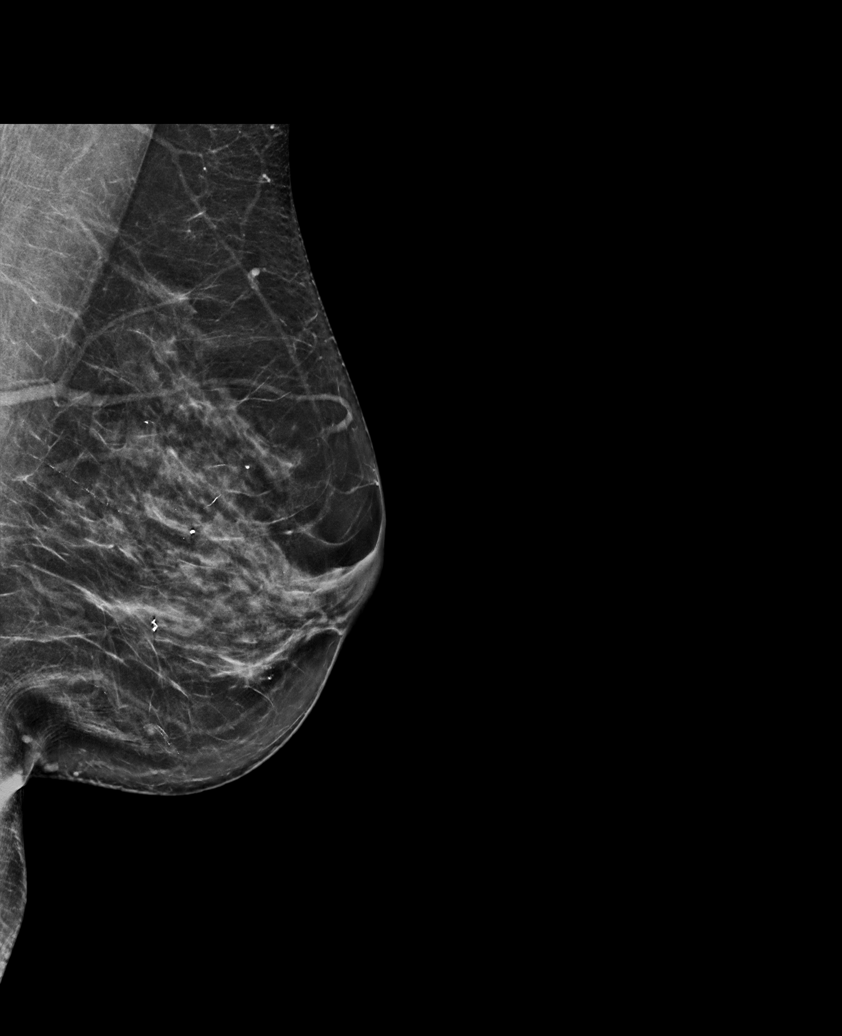

[R MLO synth-2D]
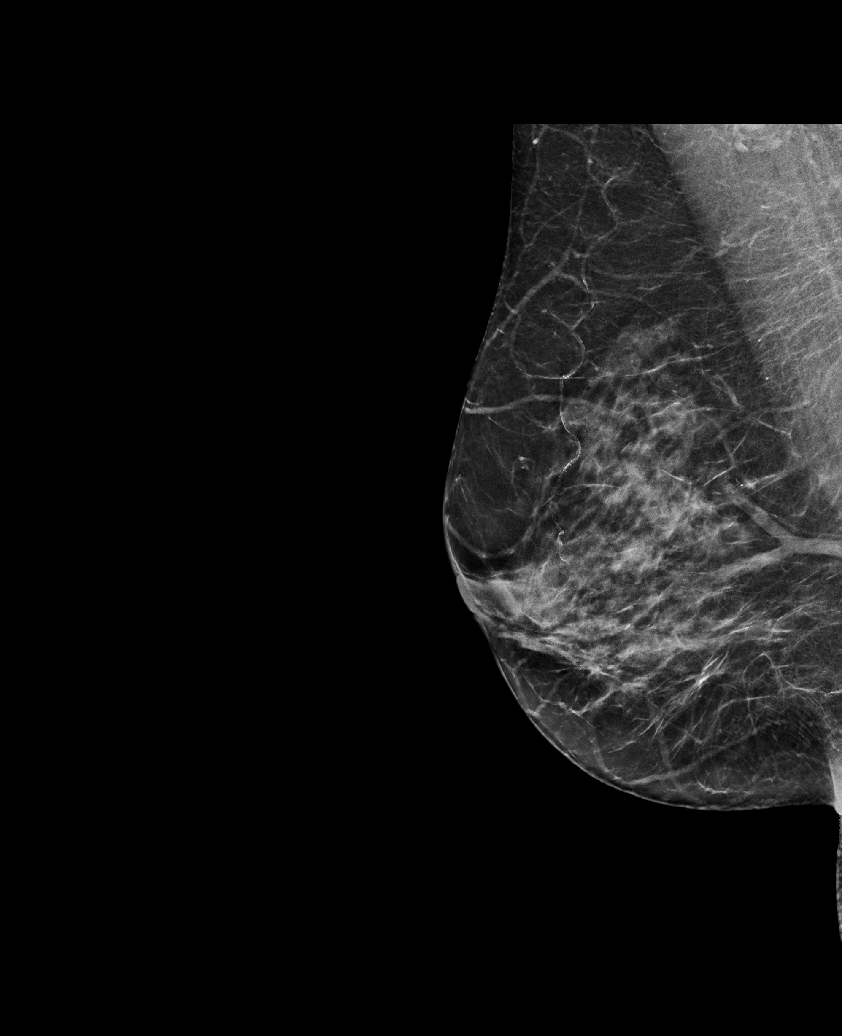

[L MLO synth-2D (2 of 2)]
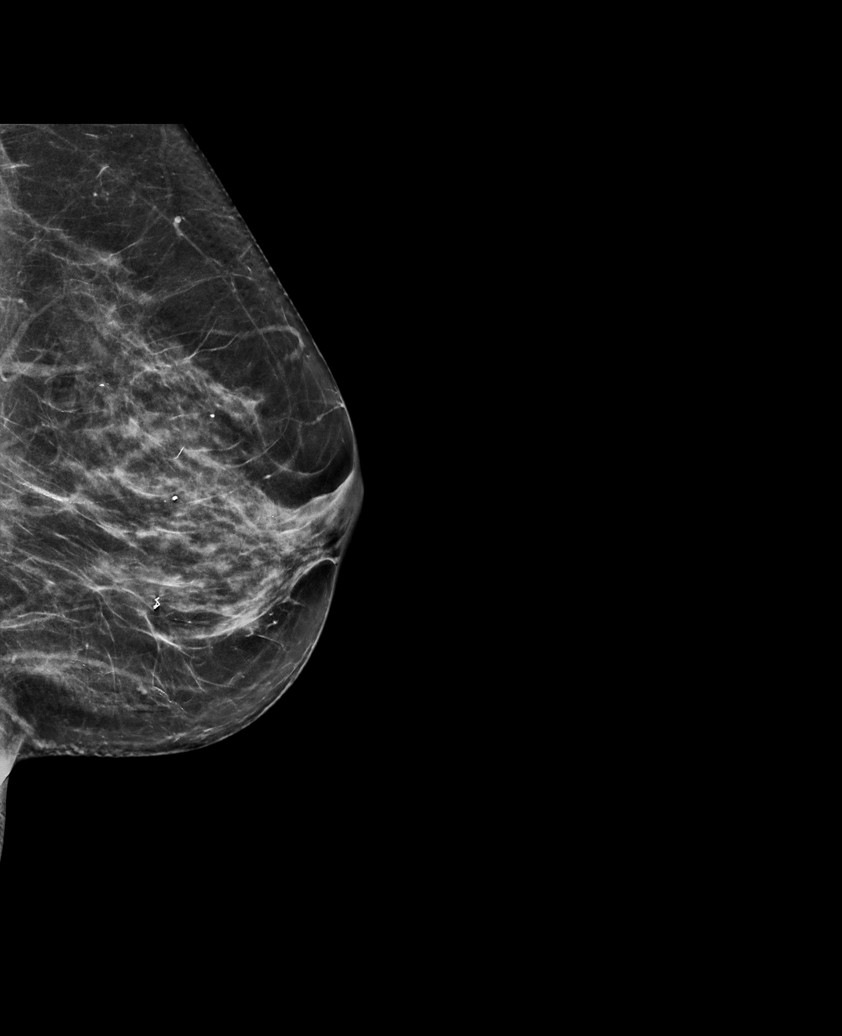

[L MLO tomo · tomo slice 36/71.0]
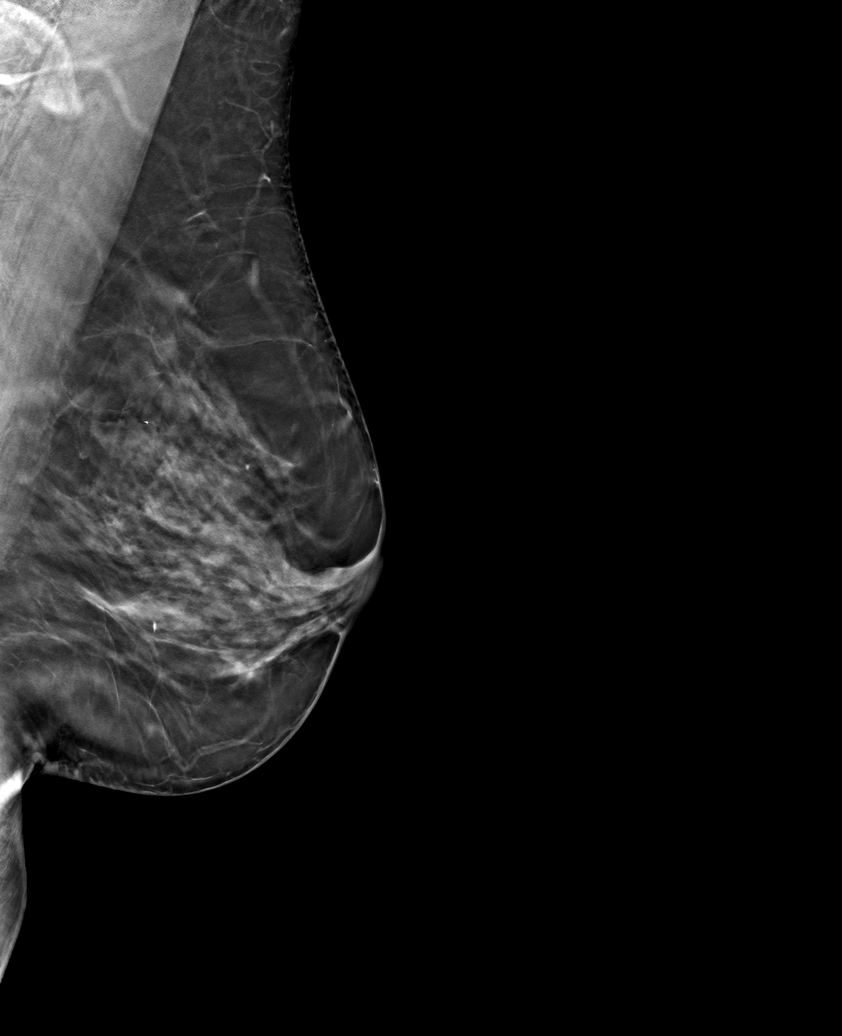

[6 of 30 positions shown; findings below may reference images not displayed]

ACR Breast Density Category c: The breast tissue is heterogeneously
dense, which may obscure small masses.
FINDINGS: There are no findings suspicious for malignancy. Images were
processed with CAD.
IMPRESSION: No mammographic evidence of malignancy. A result letter of this
screening mammogram will be mailed directly to the patient.

RECOMMENDATION:
Screening mammogram in one year. (Code:[id number])

BI-RADS CATEGORY  1: Negative.

## 2020-11-17 NOTE — Progress Notes (Signed)
CARDIOLOGY OFFICE NOTE  Date:  11/24/2020    [name] Date of Birth: [birth year] Medical Record #[id number]  PCP:  [name], DO  Cardiologist:  Johnsie Cancel   No chief complaint on file.   History of Present Illness: Vanessa Owens is a 60 y.o. 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 She has been on chronic coumadin. Echos done at [facility] due to cost. Prior normal GXT in 2018 for atypical chest pain.   TTE reviewed from 03/18/20 EF 60-65% mean gradient 26 mmHg up from 21 mmHg in 2019 peak gradient 40 mmHg DVI 0.42 and AVA 1.3 cm2 mild AR  Her INR;s have been Rx with no bleeding issues   Working virtually for [employer] also working some at [employer].  She has a 19 mm HP St jude valve placed by DR PVT 08/10/1999 for bicuspid AV   Past Medical History:  Diagnosis Date   Anemia    Anxiety    Aortic stenosis     by Dr. Prescott Gum   CHEST PAIN UNSPECIFIED    Qualifier: Diagnosis of  By: Burnett Harry MD, Shanon Brow     Colon polyp    Costochondritis    x2   Depression    Diverticulosis    GERD (gastroesophageal reflux disease)    Glaucoma (increased eye pressure)    HLD (hyperlipidemia)    IBS (irritable bowel syndrome)    Long term current use of anticoagulant    Migraine    OAB (overactive bladder)    Osteoporosis    Rash    S/P AVR    2001 for bicuspid AV.....Marland KitchenST JUDE   Shingles    ? diag/rash 11/21/12   Shortness of breath    Qualifier: Diagnosis of  By: York, LPN, [name]     Vulvar ulceration    recurrent    Past Surgical History:  Procedure Laterality Date   AORTIC VALVE REPLACEMENT  05/18/1999   St Jude   APPENDECTOMY     post op abdominal wall hematoma/2 transfusions   BREAST BIOPSY Left 09/15/2006   left breast   CESAREAN SECTION     x 2   FINGER FRACTURE SURGERY Left    pinky     Medications: Current Meds  Medication Sig   amoxicillin (AMOXIL) 500 MG capsule as directed.   PEG-KCl-NaCl-NaSulf-Na Asc-C  (PLENVU) 140 g SOLR Take 140 g by mouth as directed. Manufacturer's coupon Universal coupon code:BIN: P2366821; GROUP: [id number]; PCN: [id number]; ID: [id number]; PAY NO MORE $50   warfarin (COUMADIN) 7.5 MG tablet TAKE 1 TABLET BY MOUTH DAILY OR AS DIRECTED BY ANTICOAGULATION CLINIC.     Allergies: Allergies  Allergen Reactions   Morphine And Related Nausea Only    Social History: The patient  reports that she has never smoked. She has never used smokeless tobacco. She reports current alcohol use of about 1.0 - 2.0 standard drink of alcohol per week. She reports that she does not use drugs.   Family History: The patient's family history includes Alzheimer's disease in her maternal grandfather; Colon cancer in her maternal great-grandmother; Diabetes in her brother, maternal grandfather, mother, and son; Heart disease in her maternal grandmother; Hyperlipidemia in her brother and mother; Hypertension in her brother and mother; Irritable bowel syndrome in her mother; Kidney disease in her maternal uncle; Pancreatic cancer in her maternal aunt; Stroke in her maternal grandmother.   Review of Systems: Please see the history of present illness.  All other systems are reviewed and negative.   Physical Exam: VS:  BP 132/80   Pulse 71   Ht 5\' 1"  (1.549 m)   Wt 66.3 kg   LMP 05/18/2003   SpO2 94%   BMI 27.62 kg/m  .  BMI Body mass index is 27.62 kg/m.  Wt Readings from Last 3 Encounters:  11/24/20 66.3 kg  10/28/20 67.8 kg  02/26/20 71.2 kg    Affect appropriate Healthy:  appears stated age 42: normal Neck supple with no adenopathy JVP normal no bruits no thyromegaly Lungs clear with no wheezing and good diaphragmatic motion Heart:  C7/E9 click SEM  no AR murmur, no rub, gallop or click PMI normal Abdomen: benighn, BS positve, no tenderness, no AAA no bruit.  No HSM or HJR Distal pulses intact with no bruits No edema Neuro non-focal Skin warm and dry No muscular  weakness    LABORATORY DATA:  EKG:  EKG is ordered today.  Personally reviewed by me. This demonstrates NSR.  Lab Results  Component Value Date   WBC 5.2 04/03/2019   HGB 13.8 04/03/2019   HCT 40.0 04/03/2019   PLT 181 04/03/2019   GLUCOSE 97 04/03/2019   CHOL 251 (H) 04/03/2019   TRIG 133 04/03/2019   HDL 69 04/03/2019   LDLCALC 159 (H) 04/03/2019   ALT 13 04/03/2019   AST 20 04/03/2019   NA 139 04/03/2019   K 4.1 04/03/2019   CL 99 04/03/2019   CREATININE 0.71 04/03/2019   BUN 12 04/03/2019   CO2 27 04/03/2019   TSH 3.530 04/03/2019   INR 4.1 (A) 11/19/2020   HGBA1C 5.2 06/28/2013   Lab Results  Component Value Date   INR 4.1 (A) 11/19/2020   INR 3.5 (A) 10/22/2020   INR 4.3 (A) 10/01/2020   PROTIME 21.8 10/17/2008   PROTIME 18.0 10/10/2008     BNP (last 3 results) No results for input(s): BNP in the last 8760 hours.  ProBNP (last 3 results) No results for input(s): PROBNP in the last 8760 hours.   Other Studies Reviewed Today:  Echo Study Conclusions 10/2017  - Left ventricle: The cavity size was normal. Systolic function was    normal. The estimated ejection fraction was in the range of 55%    to 60%. Wall motion was normal; there were no regional wall    motion abnormalities. Features are consistent with a pseudonormal    left ventricular filling pattern, with concomitant abnormal    relaxation and increased filling pressure (grade 2 diastolic    dysfunction).  - Aortic valve: A mechanical prosthesis was present and functioning    normally. Mean gradient (S): 21 mm Hg. Peak gradient (S): 38 mm    Hg. Valve area (VTI): 1.35 cm^2. Valve area (Vmax): 1.18 cm^2.    Valve area (Vmean): 1.24 cm^2.  - Mitral valve: There was mild regurgitation directed centrally.  - Left atrium: The atrium was mildly dilated.   Impressions:   - Mildly increased prosthetic valve gradients and mild dilation of    the ascending aorta are unchanged from the previous  study.   Assessment/Plan:  AVR :  old valve with high gradients but asymptomatic no change in murmur good valve click and valve size on ly 19 mm will observe and f/u echo November INR RX  2. Anticoagulation - no problems noted. Has held for 3 days prior to surgery without incident in past   Current medicines are reviewed with the patient today.  The patient does not have concerns regarding medicines other than what has been noted above.  The following changes have been made:  See above.  Labs/ tests ordered today include: TTE and cardiac CTA    No orders of the defined types were placed in this encounter.    Disposition:   FU in a year   Signed: [name], MD  11/24/2020 8:49 AM  [facility] [address] [place], [place]  [address] Phone: [phone] Fax: [phone]

## 2020-11-19 ENCOUNTER — Ambulatory Visit (INDEPENDENT_AMBULATORY_CARE_PROVIDER_SITE_OTHER): Payer: No Typology Code available for payment source | Admitting: *Deleted

## 2020-11-19 ENCOUNTER — Other Ambulatory Visit: Payer: Self-pay

## 2020-11-19 DIAGNOSIS — Z7901 Long term (current) use of anticoagulants: Secondary | ICD-10-CM

## 2020-11-19 DIAGNOSIS — Z952 Presence of prosthetic heart valve: Secondary | ICD-10-CM

## 2020-11-19 LAB — POCT INR: INR: 4.1 — AB (ref 2.0–3.0)

## 2020-11-19 NOTE — Patient Instructions (Signed)
Description   Do not take any Warfarin today then continue on same dosage 1 tablet daily. Follow instructions for upcoming procedure. Recheck INR in 1 week post procedure. Coumadin Clinic. [phone].     Last dose of Warfarin on 7/23/222 and once you resume take an extra 1/2 tablet for 2 days.

## 2020-11-24 ENCOUNTER — Ambulatory Visit (INDEPENDENT_AMBULATORY_CARE_PROVIDER_SITE_OTHER): Payer: No Typology Code available for payment source | Admitting: Cardiovascular Disease

## 2020-11-24 ENCOUNTER — Other Ambulatory Visit: Payer: Self-pay

## 2020-11-24 ENCOUNTER — Encounter: Payer: Self-pay | Admitting: Cardiovascular Disease

## 2020-11-24 VITALS — BP 132/80 | HR 71 | Ht 61.0 in | Wt 146.2 lb

## 2020-11-24 DIAGNOSIS — Z952 Presence of prosthetic heart valve: Secondary | ICD-10-CM | POA: Diagnosis not present

## 2020-11-24 DIAGNOSIS — Z7901 Long term (current) use of anticoagulants: Secondary | ICD-10-CM

## 2020-11-24 NOTE — Patient Instructions (Signed)
Medication Instructions:   *If you need a refill on your cardiac medications before your next appointment, please call your pharmacy*   Lab Work: If you have labs (blood work) drawn today and your tests are completely normal, you will receive your results only by: [place] (if you have MyChart) OR A paper copy in the mail If you have any lab test that is abnormal or we need to change your treatment, we will call you to review the results.  Testing/Procedures: Your physician has requested that you have an echocardiogram at same time in November at office visit. Echocardiography is a painless test that uses sound waves to create images of your heart. It provides your doctor with information about the size and shape of your heart and how well your heart's chambers and valves are working. This procedure takes approximately one hour. There are no restrictions for this procedure.  Follow-Up: At [facility], you and your health needs are our priority.  As part of our continuing mission to provide you with exceptional heart care, we have created designated Provider Care Teams.  These Care Teams include your primary Cardiologist (physician) and Advanced Practice Providers (APPs -  Physician Assistants and Nurse Practitioners) who all work together to provide you with the care you need, when you need it.  We recommend signing up for the patient portal called "MyChart".  Sign up information is provided on this After Visit Summary.  MyChart is used to connect with patients for Virtual Visits (Telemedicine).  Patients are able to view lab/test results, encounter notes, upcoming appointments, etc.  Non-urgent messages can be sent to your provider as well.   To learn more about what you can do with MyChart, go to [email or web address].    Your next appointment:   November at same time as echo  The format for your next appointment:   In Person  Provider:   You may see [name], MD or one  of the following Advanced Practice Providers on your designated Care Team:   Cecilie Kicks, NP

## 2020-12-05 ENCOUNTER — Encounter: Payer: Self-pay | Admitting: Gastroenterology

## 2020-12-08 ENCOUNTER — Telehealth: Payer: Self-pay | Admitting: Gastroenterology

## 2020-12-08 NOTE — Telephone Encounter (Signed)
Inbound call from pt requesting a call back stating that she ate popcorn, cucumbers, onions, and tomatoes yesterday and wondering if she can still move forward with her procedure. Please advise. Thanks.

## 2020-12-08 NOTE — Telephone Encounter (Signed)
The pt has been advised that she can proceed as planned with procedure.  She was told to follow the instructions from this point forward.  The pt agreed and will proceed with procedure as planned.

## 2020-12-12 ENCOUNTER — Ambulatory Visit (AMBULATORY_SURGERY_CENTER): Payer: No Typology Code available for payment source | Admitting: Gastroenterology

## 2020-12-12 ENCOUNTER — Other Ambulatory Visit: Payer: Self-pay

## 2020-12-12 ENCOUNTER — Encounter: Payer: Self-pay | Admitting: Gastroenterology

## 2020-12-12 VITALS — BP 119/62 | HR 67 | Temp 97.1°F | Resp 15 | Ht 60.0 in | Wt 149.0 lb

## 2020-12-12 DIAGNOSIS — Z86010 Personal history of colonic polyps: Secondary | ICD-10-CM

## 2020-12-12 DIAGNOSIS — R140 Abdominal distension (gaseous): Secondary | ICD-10-CM

## 2020-12-12 DIAGNOSIS — D123 Benign neoplasm of transverse colon: Secondary | ICD-10-CM

## 2020-12-12 DIAGNOSIS — Z7902 Long term (current) use of antithrombotics/antiplatelets: Secondary | ICD-10-CM

## 2020-12-12 MED ORDER — SODIUM CHLORIDE 0.9 % IV SOLN: 500.0000 mL | Freq: Once | INTRAVENOUS | Status: DC

## 2020-12-12 NOTE — Progress Notes (Signed)
pt tolerated well. VSS. awake and to recovery. Report given to RN.  

## 2020-12-12 NOTE — Patient Instructions (Signed)
Handouts on polyps and diverticulosis given to you today  Resume coumadin today as normal   YOU HAD AN ENDOSCOPIC PROCEDURE TODAY AT [facility]:   Refer to the procedure report that was given to you for any specific questions about what was found during the examination.  If the procedure report does not answer your questions, please call your gastroenterologist to clarify.  If you requested that your care partner not be given the details of your procedure findings, then the procedure report has been included in a sealed envelope for you to review at your convenience later.  YOU SHOULD EXPECT: Some feelings of bloating in the abdomen. Passage of more gas than usual.  Walking can help get rid of the air that was put into your GI tract during the procedure and reduce the bloating. If you had a lower endoscopy (such as a colonoscopy or flexible sigmoidoscopy) you may notice spotting of blood in your stool or on the toilet paper. If you underwent a bowel prep for your procedure, you may not have a normal bowel movement for a few days.  Please Note:  You might notice some irritation and congestion in your nose or some drainage.  This is from the oxygen used during your procedure.  There is no need for concern and it should clear up in a day or so.  SYMPTOMS TO REPORT IMMEDIATELY:  Following lower endoscopy (colonoscopy or flexible sigmoidoscopy):  Excessive amounts of blood in the stool  Significant tenderness or worsening of abdominal pains  Swelling of the abdomen that is new, acute  Fever of 100F or higher  For urgent or emergent issues, a gastroenterologist can be reached at any hour by calling [phone]. Do not use MyChart messaging for urgent concerns.    DIET:  We do recommend a small meal at first, but then you may proceed to your regular diet.  Drink plenty of fluids but you should avoid alcoholic beverages for 24 hours.  ACTIVITY:  You should plan to take it easy  for the rest of today and you should NOT DRIVE or use heavy machinery until tomorrow (because of the sedation medicines used during the test).    FOLLOW UP: Our staff will call the number listed on your records 48-72 hours following your procedure to check on you and address any questions or concerns that you may have regarding the information given to you following your procedure. If we do not reach you, we will leave a message.  We will attempt to reach you two times.  During this call, we will ask if you have developed any symptoms of COVID 19. If you develop any symptoms (ie: fever, flu-like symptoms, shortness of breath, cough etc.) before then, please call [phone].  If you test positive for Covid 19 in the 2 weeks post procedure, please call and report this information to [place].    If any biopsies were taken you will be contacted by phone or by letter within the next 1-3 weeks.  Please call us at [phone] if you have not heard about the biopsies in 3 weeks.    SIGNATURES/CONFIDENTIALITY: You and/or your care partner have signed paperwork which will be entered into your electronic medical record.  These signatures attest to the fact that that the information above on your After Visit Summary has been reviewed and is understood.  Full responsibility of the confidentiality of this discharge information lies with you and/or your care-partner.

## 2020-12-12 NOTE — Progress Notes (Signed)
Medical history reviewed with no changes noted. VS assessed by C.W 

## 2020-12-12 NOTE — Progress Notes (Signed)
History:  This patient presents for endoscopic testing for Hx colon polyps. See clinic visit note 10/28/20 for details No changes since then/. Anti coag clinic advised coumadin hold without lovenox  [name] Referring physician: Carollee Herter, Alferd Apa, DO  Past Medical History: Past Medical History:  Diagnosis Date   Anemia    Anxiety    Aortic stenosis     by Dr. Prescott Gum   CHEST PAIN UNSPECIFIED    Qualifier: Diagnosis of  By: Burnett Harry MD, Shanon Brow     Colon polyp    Costochondritis    x2   Depression    Diverticulosis    GERD (gastroesophageal reflux disease)    Glaucoma (increased eye pressure)    HLD (hyperlipidemia)    IBS (irritable bowel syndrome)    Long term current use of anticoagulant    Migraine    OAB (overactive bladder)    Osteoporosis    Rash    S/P AVR    2001 for bicuspid AV.....Marland KitchenST JUDE   Shingles    ? diag/rash 11/21/12   Shortness of breath    Qualifier: Diagnosis of  By: Allean Found LPN, [name]     Vulvar ulceration    recurrent     Past Surgical History: Past Surgical History:  Procedure Laterality Date   AORTIC VALVE REPLACEMENT  05/18/1999   [facility]   APPENDECTOMY     post op abdominal wall hematoma/2 transfusions   BREAST BIOPSY Left 09/15/2006   left breast   CESAREAN SECTION     x 2   FINGER FRACTURE SURGERY Left    pinky    Allergies: Allergies  Allergen Reactions   Morphine And Related Nausea Only    Outpatient Meds: Current Outpatient Medications  Medication Sig Dispense Refill   risedronate (ACTONEL) 35 MG tablet TAKE 1 TABLET (35 MG TOTAL) BY MOUTH EVERY 7 (SEVEN) DAYS. WITH WATER ON EMPTY STOMACH, NOTHING BY MOUTH OR LIE DOWN FOR NEXT 30 MINUTES. (Patient not taking: No sig reported) 12 tablet 1   warfarin (COUMADIN) 7.5 MG tablet TAKE 1 TABLET BY MOUTH DAILY OR AS DIRECTED BY ANTICOAGULATION CLINIC. 100 tablet 0   Current Facility-Administered Medications  Medication Dose Route Frequency Provider Last Rate Last  Admin   0.9 %  sodium chloride infusion  500 mL Intravenous Once Danis, Kirke Corin, MD          ___________________________________________________________________ Objective   Exam:  BP 134/75   Pulse 84   Temp (!) 97.1 F (36.2 C) (Skin)   Ht 5' (1.524 m)   Wt 149 lb (67.6 kg)   LMP 05/18/2003   SpO2 100%   BMI 29.10 kg/m   CV: RRR with valve sound, S1/S2, no JVD, no peripheral edema Resp: clear to auscultation bilaterally, normal RR and effort noted GI: soft, no tenderness, with active bowel sounds. No guarding or palpable organomegaly noted. Neuro: awake, alert and oriented x 3. Normal gross motor function and fluent speech   Assessment:  Hx colon polyps  Plan:  Colonoscopy   Nelida Meuse III

## 2020-12-12 NOTE — Progress Notes (Signed)
Called to room to assist during endoscopic procedure.  Patient ID and intended procedure confirmed with present staff. Received instructions for my participation in the procedure from the performing physician.  

## 2020-12-12 NOTE — Op Note (Signed)
[facility] Patient Name: Vanessa Owens Procedure Date: 12/12/2020 2:56 PM MRN: [id number] Endoscopist: Mallie Mussel L. Loletha Carrow , MD Age: 60 Referring MD:  Date of Birth: 1961/03/15 Gender: Female Account #: [id number] Procedure:                Colonoscopy Indications:              Surveillance: Personal history of adenomatous                            polyps on last colonoscopy > 5 years ago                           TA and SSP in 2015 Medicines:                Monitored Anesthesia Care Procedure:                Pre-Anesthesia Assessment:                           - Prior to the procedure, a History and Physical                            was performed, and patient medications and                            allergies were reviewed. The patient's tolerance of                            previous anesthesia was also reviewed. The risks                            and benefits of the procedure and the sedation                            options and risks were discussed with the patient.                            All questions were answered, and informed consent                            was obtained. Prior Anticoagulants: The patient has                            taken Coumadin (warfarin), last dose was 5 days                            prior to procedure. ASA Grade Assessment: III - A                            patient with severe systemic disease. After                            reviewing the risks and benefits, the patient was  deemed in satisfactory condition to undergo the                            procedure.                           After obtaining informed consent, the colonoscope                            was passed under direct vision. Throughout the                            procedure, the patient's blood pressure, pulse, and                            oxygen saturations were monitored continuously. The                            Olympus  CF-HQ190L (Serial# [id number]) Colonoscope was                            introduced through the anus and advanced to the the                            cecum, identified by appendiceal orifice and                            ileocecal valve. The colonoscopy was performed                            without difficulty. The patient tolerated the                            procedure well. The quality of the bowel                            preparation was excellent. The ileocecal valve,                            appendiceal orifice, and rectum were photographed.                            The bowel preparation used was Plenvu. Scope In: 3:00:08 PM Scope Out: 3:13:08 PM Scope Withdrawal Time: 0 hours 8 minutes 37 seconds  Total Procedure Duration: 0 hours 13 minutes 0 seconds  Findings:                 The perianal and digital rectal examinations were                            normal.                           Multiple diverticula were found in the left colon  and right colon.                           A diminutive polyp was found in the proximal                            transverse colon. The polyp was flat. The polyp was                            removed with a cold biopsy forceps. Resection and                            retrieval were complete.                           The exam was otherwise without abnormality on                            direct and retroflexion views. Complications:            No immediate complications. Estimated Blood Loss:     Estimated blood loss was minimal. Impression:               - Diverticulosis in the left colon and in the right                            colon.                           - One diminutive polyp in the proximal transverse                            colon, removed with a cold biopsy forceps. Resected                            and retrieved.                           - The examination was otherwise normal on direct                             and retroflexion views. Recommendation:           - Patient has a contact number available for                            emergencies. The signs and symptoms of potential                            delayed complications were discussed with the                            patient. Return to normal activities tomorrow.                            Written discharge instructions were provided to the  patient.                           - Resume previous diet.                           - Continue present medications.                           - Await pathology results.                           - Resume Coumadin (warfarin) at prior dose today.                           - Repeat colonoscopy is recommended for                            surveillance. The colonoscopy date will be                            determined after pathology results from today's                            exam become available for review. Dellamae Rosamilia L. Loletha Carrow, MD 12/12/2020 3:20:59 PM This report has been signed electronically.

## 2020-12-16 ENCOUNTER — Telehealth: Payer: Self-pay

## 2020-12-16 NOTE — Telephone Encounter (Signed)
  Follow up Call-  Call back number 12/12/2020  Post procedure Call Back phone  # [phone]  Permission to leave phone message Yes  Some recent data might be hidden     Patient questions:  Do you have a fever, pain , or abdominal swelling? No. Pain Score  0 *  Have you tolerated food without any problems? Yes.    Have you been able to return to your normal activities? Yes.    Do you have any questions about your discharge instructions: Diet   No. Medications  No. Follow up visit  No.  Do you have questions or concerns about your Care? No.  Actions: * If pain score is 4 or above: No action needed, pain <4.

## 2020-12-17 ENCOUNTER — Other Ambulatory Visit: Payer: Self-pay

## 2020-12-17 ENCOUNTER — Ambulatory Visit (INDEPENDENT_AMBULATORY_CARE_PROVIDER_SITE_OTHER): Payer: No Typology Code available for payment source

## 2020-12-17 DIAGNOSIS — Z952 Presence of prosthetic heart valve: Secondary | ICD-10-CM | POA: Diagnosis not present

## 2020-12-17 DIAGNOSIS — Z7901 Long term (current) use of anticoagulants: Secondary | ICD-10-CM | POA: Diagnosis not present

## 2020-12-17 LAB — POCT INR: INR: 2.2 (ref 2.0–3.0)

## 2020-12-17 NOTE — Patient Instructions (Signed)
Description   Take 1.5 tablets tonight and then continue on same dosage 1 tablet daily. Recheck INR in 1 week. [facility]. [phone].

## 2020-12-22 ENCOUNTER — Encounter: Payer: Self-pay | Admitting: Gastroenterology

## 2020-12-30 ENCOUNTER — Ambulatory Visit (INDEPENDENT_AMBULATORY_CARE_PROVIDER_SITE_OTHER): Payer: No Typology Code available for payment source | Admitting: *Deleted

## 2020-12-30 ENCOUNTER — Other Ambulatory Visit: Payer: Self-pay

## 2020-12-30 DIAGNOSIS — Z7901 Long term (current) use of anticoagulants: Secondary | ICD-10-CM | POA: Diagnosis not present

## 2020-12-30 DIAGNOSIS — Z952 Presence of prosthetic heart valve: Secondary | ICD-10-CM | POA: Diagnosis not present

## 2020-12-30 LAB — POCT INR: INR: 4.4 — AB (ref 2.0–3.0)

## 2020-12-30 NOTE — Patient Instructions (Signed)
Description   Do not take Warfarin today, then continue taking Warfarin 1 tablet daily. Recheck INR in 2 weeks. [facility] [phone].

## 2021-01-08 ENCOUNTER — Other Ambulatory Visit: Payer: Self-pay

## 2021-01-08 ENCOUNTER — Ambulatory Visit
Admission: RE | Admit: 2021-01-08 | Discharge: 2021-01-08 | Disposition: A | Discharge status: Home / Self Care | Payer: No Typology Code available for payment source | Source: Ambulatory Visit | Attending: Obstetrics & Gynecology | Admitting: Obstetrics & Gynecology

## 2021-01-08 DIAGNOSIS — Z1231 Encounter for screening mammogram for malignant neoplasm of breast: Secondary | ICD-10-CM

## 2021-01-14 ENCOUNTER — Other Ambulatory Visit: Payer: Self-pay

## 2021-01-14 ENCOUNTER — Ambulatory Visit (INDEPENDENT_AMBULATORY_CARE_PROVIDER_SITE_OTHER): Payer: No Typology Code available for payment source

## 2021-01-14 DIAGNOSIS — Z7901 Long term (current) use of anticoagulants: Secondary | ICD-10-CM

## 2021-01-14 DIAGNOSIS — Z5181 Encounter for therapeutic drug level monitoring: Secondary | ICD-10-CM | POA: Diagnosis not present

## 2021-01-14 LAB — POCT INR: INR: 3.6 — AB (ref 2.0–3.0)

## 2021-01-14 NOTE — Patient Instructions (Signed)
Description   Eat greens tonight and continue taking Warfarin 1 tablet daily. Recheck INR in 3 weeks. [facility] [phone].

## 2021-02-02 ENCOUNTER — Ambulatory Visit (INDEPENDENT_AMBULATORY_CARE_PROVIDER_SITE_OTHER): Payer: No Typology Code available for payment source

## 2021-02-02 ENCOUNTER — Other Ambulatory Visit: Payer: Self-pay

## 2021-02-02 DIAGNOSIS — Z7901 Long term (current) use of anticoagulants: Secondary | ICD-10-CM | POA: Diagnosis not present

## 2021-02-02 DIAGNOSIS — Z5181 Encounter for therapeutic drug level monitoring: Secondary | ICD-10-CM | POA: Diagnosis not present

## 2021-02-02 LAB — POCT INR: INR: 2.9 (ref 2.0–3.0)

## 2021-02-02 NOTE — Patient Instructions (Signed)
Description   Continue taking Warfarin 1 tablet daily. Recheck INR in 4 weeks. [facility] [phone].

## 2021-02-09 ENCOUNTER — Encounter (HOSPITAL_BASED_OUTPATIENT_CLINIC_OR_DEPARTMENT_OTHER): Payer: Self-pay | Admitting: Obstetrics & Gynecology

## 2021-02-09 ENCOUNTER — Other Ambulatory Visit: Payer: Self-pay

## 2021-02-09 ENCOUNTER — Ambulatory Visit (INDEPENDENT_AMBULATORY_CARE_PROVIDER_SITE_OTHER): Payer: No Typology Code available for payment source | Admitting: Obstetrics & Gynecology

## 2021-02-09 ENCOUNTER — Other Ambulatory Visit (HOSPITAL_COMMUNITY)
Admission: RE | Admit: 2021-02-09 | Discharge: 2021-02-09 | Disposition: A | Discharge status: Home / Self Care | Payer: No Typology Code available for payment source | Source: Ambulatory Visit | Attending: Obstetrics & Gynecology | Admitting: Obstetrics & Gynecology

## 2021-02-09 VITALS — BP 129/56 | HR 67 | Ht 59.5 in | Wt 139.0 lb

## 2021-02-09 DIAGNOSIS — E785 Hyperlipidemia, unspecified: Secondary | ICD-10-CM

## 2021-02-09 DIAGNOSIS — Z952 Presence of prosthetic heart valve: Secondary | ICD-10-CM

## 2021-02-09 DIAGNOSIS — Z780 Asymptomatic menopausal state: Secondary | ICD-10-CM

## 2021-02-09 DIAGNOSIS — Z01419 Encounter for gynecological examination (general) (routine) without abnormal findings: Secondary | ICD-10-CM | POA: Diagnosis not present

## 2021-02-09 DIAGNOSIS — Z114 Encounter for screening for human immunodeficiency virus [HIV]: Secondary | ICD-10-CM

## 2021-02-09 DIAGNOSIS — Z124 Encounter for screening for malignant neoplasm of cervix: Secondary | ICD-10-CM | POA: Diagnosis present

## 2021-02-09 DIAGNOSIS — N3281 Overactive bladder: Secondary | ICD-10-CM | POA: Insufficient documentation

## 2021-02-09 DIAGNOSIS — Z7901 Long term (current) use of anticoagulants: Secondary | ICD-10-CM

## 2021-02-09 NOTE — Patient Instructions (Signed)
Revaree vaginal suppository.  [place].

## 2021-02-09 NOTE — Addendum Note (Signed)
Addended by: Megan Salon on: 02/09/2021 08:51 AM   Modules accepted: Orders

## 2021-02-09 NOTE — Progress Notes (Addendum)
60 y.o. G2P2 Single White or Caucasian female here for annual exam.  Doing well.  Has osteoporosis.  On Coumadin long term.  Was prescribed Actonel.  Not taking this.  D/w pt importance of bone health.  Denies vaginal bleeding  Patient's last menstrual period was 05/18/2003.          Sexually active: Yes.    The current method of family planning is post menopausal status.    Exercising: Yes.     [place] and hiking Smoker:  yes  Health Maintenance: Pap:  07/19/2016 Negative History of abnormal Pap:  remote hx MMG:  01/08/2021 Negative Colonoscopy:  12/12/2020 BMD:   06/07/2019 Osteoporosis Vaccinations:  aware 2nd Covid booster, Tdap, flu, shingles vaccines have not been done Screening Labs: today   reports that she has never smoked. She has never used smokeless tobacco. She reports current alcohol use of about 1.0 - 2.0 standard drink per week. She reports that she does not use drugs.  Past Medical History:  Diagnosis Date   Anxiety    Aortic stenosis     by Dr. Prescott Gum   Colon polyp    Costochondritis    x2   Depression    Diverticulosis    GERD (gastroesophageal reflux disease)    Glaucoma (increased eye pressure)    HLD (hyperlipidemia)    IBS (irritable bowel syndrome)    Long term current use of anticoagulant    Migraine    OAB (overactive bladder)    Osteoporosis    S/P AVR    2001 for bicuspid AV.....Marland KitchenST JUDE   Shingles    ? diag/rash 11/21/12   Vulvar ulceration    recurrent    Past Surgical History:  Procedure Laterality Date   AORTIC VALVE REPLACEMENT  05/18/1999   [facility]   APPENDECTOMY     post op abdominal wall hematoma/2 transfusions   BREAST BIOPSY Left 09/15/2006   left breast   CESAREAN SECTION     x 2   FINGER FRACTURE SURGERY Left    pinky    Current Outpatient Medications  Medication Sig Dispense Refill   warfarin (COUMADIN) 7.5 MG tablet TAKE 1 TABLET BY MOUTH DAILY OR AS DIRECTED BY ANTICOAGULATION CLINIC. 100 tablet 0   risedronate  (ACTONEL) 35 MG tablet TAKE 1 TABLET (35 MG TOTAL) BY MOUTH EVERY 7 (SEVEN) DAYS. WITH WATER ON EMPTY STOMACH, NOTHING BY MOUTH OR LIE DOWN FOR NEXT 30 MINUTES. (Patient not taking: No sig reported) 12 tablet 1   No current facility-administered medications for this visit.    Family History  Problem Relation Age of Onset   Hypertension Mother    Diabetes Mother    Hyperlipidemia Mother    Irritable bowel syndrome Mother    Diabetes Brother    Hyperlipidemia Brother    Hypertension Brother    Heart disease Maternal Grandmother    Stroke Maternal Grandmother    Alzheimer's disease Maternal Grandfather    Diabetes Maternal Grandfather    Diabetes Son    Pancreatic cancer Maternal Aunt    Colon cancer Maternal Great-grandmother    Kidney disease Maternal Uncle     Review of Systems  All other systems reviewed and are negative.  Exam:   BP (!) 129/56 (BP Location: Right Arm, Patient Position: Sitting, Cuff Size: Small)   Pulse 67   Ht 4' 11.5" (1.511 m)   Wt 139 lb (63 kg)   LMP 05/18/2003   BMI 27.60 kg/m   Height:  4' 11.5" (151.1 cm)  General appearance: alert, cooperative and appears stated age Head: Normocephalic, without obvious abnormality, atraumatic Neck: no adenopathy, supple, symmetrical, trachea midline and thyroid normal to inspection and palpation Lungs: clear to auscultation bilaterally Breasts: normal appearance, no masses or tenderness Heart: regular rate and rhythm, systolic click, I cannot hear any murmurs today Abdomen: soft, non-tender; bowel sounds normal; no masses,  no organomegaly Extremities: extremities normal, atraumatic, no cyanosis or edema Skin: Skin color, texture, turgor normal. No rashes or lesions Lymph nodes: Cervical, supraclavicular, and axillary nodes normal. No abnormal inguinal nodes palpated Neurologic: Grossly normal   Pelvic: External genitalia:  no lesions              Urethra:  normal appearing urethra with no masses,  tenderness or lesions              Bartholins and Skenes: normal                 Vagina: normal appearing vagina with normal color and no discharge, no lesions              Cervix: no lesions              Pap taken: Yes.   Bimanual Exam:  Uterus:  normal size, contour, position, consistency, mobility, non-tender              Adnexa: normal adnexa and no mass, fullness, tenderness               Rectovaginal: Confirms               Anus:  normal sphincter tone, no lesions  Chaperone, Octaviano Batty, CMA, was present for exam.  Assessment/Plan: 1. Well woman exam with routine gynecological exam - pap and HR HPV obtained today - MMG up today - colonoscopy 2022 - care gaps reviewed.  Pt aware 2nd Covid booster, TDap and flu shot are due  2. Postmenopausal - no HRT  3. S/P aortic valve replacement - has follow up scheduled with Dr. Johnsie Cancel  4. Chronic anticoagulation  5. OAB (overactive bladder)  6. Elevated lipids - Comprehensive metabolic panel - Lipid panel  7.  Osteoporosis - will try actonel this year.  Does not need rx right now.

## 2021-02-10 LAB — COMPREHENSIVE METABOLIC PANEL
ALT: 16 IU/L (ref 0–32)
AST: 21 IU/L (ref 0–40)
Albumin/Globulin Ratio: 1.9 (ref 1.2–2.2)
Albumin: 4.7 g/dL (ref 3.8–4.9)
Alkaline Phosphatase: 90 IU/L (ref 44–121)
BUN/Creatinine Ratio: 14 (ref 9–23)
BUN: 11 mg/dL (ref 6–24)
Bilirubin Total: 0.8 mg/dL (ref 0.0–1.2)
CO2: 24 mmol/L (ref 20–29)
Calcium: 9.1 mg/dL (ref 8.7–10.2)
Chloride: 102 mmol/L (ref 96–106)
Creatinine, Ser: 0.8 mg/dL (ref 0.57–1.00)
Globulin, Total: 2.5 g/dL (ref 1.5–4.5)
Glucose: 91 mg/dL (ref 70–99)
Potassium: 3.9 mmol/L (ref 3.5–5.2)
Sodium: 139 mmol/L (ref 134–144)
Total Protein: 7.2 g/dL (ref 6.0–8.5)
eGFR: 85 mL/min/{1.73_m2} (ref >59)

## 2021-02-10 LAB — LIPID PANEL
Chol/HDL Ratio: 3.5 ratio (ref 0.0–4.4)
Cholesterol, Total: 205 mg/dL — ABNORMAL HIGH (ref 100–199)
HDL: 59 mg/dL (ref >39)
LDL Chol Calc (NIH): 126 mg/dL — ABNORMAL HIGH (ref 0–99)
Triglycerides: 112 mg/dL (ref 0–149)
VLDL Cholesterol Cal: 20 mg/dL (ref 5–40)

## 2021-02-10 LAB — HIV ANTIBODY (ROUTINE TESTING W REFLEX): HIV Screen 4th Generation wRfx: NONREACTIVE

## 2021-02-11 LAB — CYTOLOGY - PAP
Comment: NEGATIVE
Diagnosis: NEGATIVE
High risk HPV: NEGATIVE

## 2021-02-14 ENCOUNTER — Other Ambulatory Visit: Payer: Self-pay | Admitting: Cardiovascular Disease

## 2021-02-27 ENCOUNTER — Other Ambulatory Visit: Payer: Self-pay

## 2021-02-27 ENCOUNTER — Ambulatory Visit (INDEPENDENT_AMBULATORY_CARE_PROVIDER_SITE_OTHER): Payer: No Typology Code available for payment source | Admitting: *Deleted

## 2021-02-27 DIAGNOSIS — Z7901 Long term (current) use of anticoagulants: Secondary | ICD-10-CM

## 2021-02-27 DIAGNOSIS — Z5181 Encounter for therapeutic drug level monitoring: Secondary | ICD-10-CM | POA: Diagnosis not present

## 2021-02-27 LAB — POCT INR: INR: 4.8 — AB (ref 2.0–3.0)

## 2021-02-27 NOTE — Patient Instructions (Signed)
Description   -Hold warfarin today.  -Tomorrow take 1/2 a tablet of warfarin.  Then continue to take warfarin 1 tablet daily. Recheck INR in 2 weeks. Coumadin clinic [phone].

## 2021-03-18 ENCOUNTER — Ambulatory Visit (INDEPENDENT_AMBULATORY_CARE_PROVIDER_SITE_OTHER): Payer: No Typology Code available for payment source

## 2021-03-18 ENCOUNTER — Other Ambulatory Visit: Payer: Self-pay

## 2021-03-18 DIAGNOSIS — Z7901 Long term (current) use of anticoagulants: Secondary | ICD-10-CM | POA: Diagnosis not present

## 2021-03-18 DIAGNOSIS — Z5181 Encounter for therapeutic drug level monitoring: Secondary | ICD-10-CM

## 2021-03-18 LAB — POCT INR: INR: 3.6 — AB (ref 2.0–3.0)

## 2021-03-18 NOTE — Patient Instructions (Addendum)
Description   Eat greens tonight and Continue to take warfarin 1 tablet daily. Recheck INR in 3 weeks. Coumadin clinic [phone].

## 2021-03-23 NOTE — Progress Notes (Signed)
CARDIOLOGY OFFICE NOTE  Date:  03/27/2021    Vanessa Owens Date of Birth: [birth year] Medical Record #[id number]  PCP:  Vanessa Held, DO  Cardiologist:  Vanessa Owens   No chief complaint on file.   History of Present Illness: Vanessa Owens is a 60 y.o. 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 She has been on chronic coumadin. Echos done at [facility] due to cost. Prior normal GXT in 2018 for atypical chest pain.   TTE reviewed from 03/18/20 EF 60-65% mean gradient 26 mmHg up from 21 mmHg in 2019 peak gradient 40 mmHg DVI 0.42 and AVA 1.3 cm2 mild AR TTE 03/27/2021 mean gradient 11 peak 19 trivial AR   Her INR;s have been Rx with no bleeding issues   Working virtually for [employer] also working some at [employer].  She has a 19 mm HP St jude valve placed by Vanessa Owens 08/10/1999 for bicuspid AV  No history of GI bleeding but has had polyps removed most recently by Vanessa Owens on 12/12/20  She is doing intermittent fasting with good weight loss. Still working at [employer] part time and has 3 grand kids now    Past Medical History:  Diagnosis Date   Anxiety    Aortic stenosis     by Vanessa. Prescott Owens   Colon polyp    Costochondritis    x2   Depression    Diverticulosis    GERD (gastroesophageal reflux disease)    Glaucoma (increased eye pressure)    HLD (hyperlipidemia)    IBS (irritable bowel syndrome)    Long term current use of anticoagulant    Migraine    OAB (overactive bladder)    Osteoporosis    S/P AVR    2001 for bicuspid AV.....Marland KitchenST JUDE   Shingles    ? diag/rash 11/21/12   Vulvar ulceration    recurrent    Past Surgical History:  Procedure Laterality Date   AORTIC VALVE REPLACEMENT  05/18/1999   St Jude   APPENDECTOMY     post op abdominal wall hematoma/2 transfusions   BREAST BIOPSY Left 09/15/2006   left breast   CESAREAN SECTION     x 2   FINGER FRACTURE SURGERY Left    pinky     Medications: Current Meds   Medication Sig   risedronate (ACTONEL) 35 MG tablet TAKE 1 TABLET (35 MG TOTAL) BY MOUTH EVERY 7 (SEVEN) DAYS. WITH WATER ON EMPTY STOMACH, NOTHING BY MOUTH OR LIE DOWN FOR NEXT 30 MINUTES.   warfarin (COUMADIN) 7.5 MG tablet TAKE 1 TABLET BY MOUTH DAILY OR AS DIRECTED BY ANTICOAGULATION CLINIC.     Allergies: Allergies  Allergen Reactions   Morphine And Related Nausea Only    Social History: The patient  reports that she has never smoked. She has never used smokeless tobacco. She reports current alcohol use of about 1.0 - 2.0 standard drink per week. She reports that she does not use drugs.   Family History: The patient's family history includes Alzheimer's disease in her maternal grandfather; Colon cancer in her maternal great-grandmother; Diabetes in her brother, maternal grandfather, mother, and son; Heart disease in her maternal grandmother; Hyperlipidemia in her brother and mother; Hypertension in her brother and mother; Irritable bowel syndrome in her mother; Kidney disease in her maternal uncle; Pancreatic cancer in her maternal aunt; Stroke in her maternal grandmother.   Review of Systems: Please see the history of present illness.  All other systems are reviewed and negative.   Physical Exam: VS:  BP 128/70   Pulse 64   Ht 5\' 1"  (1.549 m)   Wt 135 lb (61.2 kg)   LMP 05/18/2003   BMI 25.51 kg/m  .  BMI Body mass index is 25.51 kg/m.  Wt Readings from Last 3 Encounters:  03/27/21 135 lb (61.2 kg)  02/09/21 139 lb (63 kg)  12/12/20 149 lb (67.6 kg)    Affect appropriate Healthy:  appears stated age 67: normal Neck supple with no adenopathy JVP normal no bruits no thyromegaly Lungs clear with no wheezing and good diaphragmatic motion Heart:  G6/Y6 click SEM  no AR murmur, no rub, gallop or click PMI normal Abdomen: benighn, BS positve, no tenderness, no AAA no bruit.  No HSM or HJR Distal pulses intact with no bruits No edema Neuro non-focal Skin warm and  dry No muscular weakness    LABORATORY DATA:  EKG:  EKG is ordered today. 03/27/2021 SR rate 64 normal   Lab Results  Component Value Date   WBC 5.2 04/03/2019   HGB 13.8 04/03/2019   HCT 40.0 04/03/2019   PLT 181 04/03/2019   GLUCOSE 91 02/09/2021   CHOL 205 (H) 02/09/2021   TRIG 112 02/09/2021   HDL 59 02/09/2021   LDLCALC 126 (H) 02/09/2021   ALT 16 02/09/2021   AST 21 02/09/2021   NA 139 02/09/2021   K 3.9 02/09/2021   CL 102 02/09/2021   CREATININE 0.80 02/09/2021   BUN 11 02/09/2021   CO2 24 02/09/2021   TSH 3.530 04/03/2019   INR 3.6 (A) 03/18/2021   HGBA1C 5.2 06/28/2013   Lab Results  Component Value Date   INR 3.6 (A) 03/18/2021   INR 4.8 (A) 02/27/2021   INR 2.9 02/02/2021   PROTIME 21.8 10/17/2008   PROTIME 18.0 10/10/2008     BNP (last 3 results) No results for input(s): BNP in the last 8760 hours.  ProBNP (last 3 results) No results for input(s): PROBNP in the last 8760 hours.   Other Studies Reviewed Today:  Echo Study Conclusions 10/2017  - Left ventricle: The cavity size was normal. Systolic function was    normal. The estimated ejection fraction was in the range of 55%    to 60%. Wall motion was normal; there were no regional wall    motion abnormalities. Features are consistent with a pseudonormal    left ventricular filling pattern, with concomitant abnormal    relaxation and increased filling pressure (grade 2 diastolic    dysfunction).  - Aortic valve: A mechanical prosthesis was present and functioning    normally. Mean gradient (S): 21 mm Hg. Peak gradient (S): 38 mm    Hg. Valve area (VTI): 1.35 cm^2. Valve area (Vmax): 1.18 cm^2.    Valve area (Vmean): 1.24 cm^2.  - Mitral valve: There was mild regurgitation directed centrally.  - Left atrium: The atrium was mildly dilated.   Impressions:   - Mildly increased prosthetic valve gradients and mild dilation of    the ascending aorta are unchanged from the previous study.    Assessment/Plan:  AVR :  old valve surprisingly gradients now in normal range compared to prior normal valve clicks on exam and normal EF improved   2. Anticoagulation - no problems noted. Has Owens for 3 days prior to surgery without incident in past   3.  GI:  f/u Vanessa history of colon polyps most recently 12/12/20 polypectomy Benign  pathology   Current medicines are reviewed with the patient today.  The patient does not have concerns regarding medicines other than what has been noted above.  The following changes have been made:  See above.  Labs/ tests ordered today include: None   No orders of the defined types were placed in this encounter.    Disposition:   FU in a year   Signed: [name], MD  03/27/2021 8:44 AM  [facility] [address] [place], [place]  [address] Phone: [phone] Fax: [phone]

## 2021-03-27 ENCOUNTER — Other Ambulatory Visit: Payer: Self-pay

## 2021-03-27 ENCOUNTER — Ambulatory Visit (HOSPITAL_COMMUNITY): Payer: No Typology Code available for payment source | Attending: Cardiology

## 2021-03-27 ENCOUNTER — Ambulatory Visit (INDEPENDENT_AMBULATORY_CARE_PROVIDER_SITE_OTHER): Payer: No Typology Code available for payment source | Admitting: Cardiovascular Disease

## 2021-03-27 VITALS — BP 128/70 | HR 64 | Ht 61.0 in | Wt 135.0 lb

## 2021-03-27 DIAGNOSIS — Z7901 Long term (current) use of anticoagulants: Secondary | ICD-10-CM | POA: Diagnosis not present

## 2021-03-27 DIAGNOSIS — Z952 Presence of prosthetic heart valve: Secondary | ICD-10-CM

## 2021-03-27 LAB — ECHOCARDIOGRAM COMPLETE
AV Mean grad: 11 mmHg
AV Peak grad: 18.9 mmHg
Ao pk vel: 2.17 m/s
Area-P 1/2: 4.07 cm2
P 1/2 time: 387 msec
S' Lateral: 2.4 cm

## 2021-03-27 NOTE — Patient Instructions (Addendum)
Medication Instructions:  *If you need a refill on your cardiac medications before your next appointment, please call your pharmacy*  Lab Work: If you have labs (blood work) drawn today and your tests are completely normal, you will receive your results only by: MyChart Message (if you have MyChart) OR A paper copy in the mail If you have any lab test that is abnormal or we need to change your treatment, we will call you to review the results.  Testing/Procedures: None ordered today.  Follow-Up: At [facility], you and your health needs are our priority.  As part of our continuing mission to provide you with exceptional heart care, we have created designated Provider Care Teams.  These Care Teams include your primary Cardiologist (physician) and Advanced Practice Providers (APPs -  Physician Assistants and Nurse Practitioners) who all work together to provide you with the care you need, when you need it.  We recommend signing up for the patient portal called "MyChart".  Sign up information is provided on this After Visit Summary.  MyChart is used to connect with patients for Virtual Visits (Telemedicine).  Patients are able to view lab/test results, encounter notes, upcoming appointments, etc.  Non-urgent messages can be sent to your provider as well.   To learn more about what you can do with MyChart, go to [email or web address].    Your next appointment:   12 month(s)  The format for your next appointment:   In Person  Provider:   Peter Nishan, MD      

## 2021-03-30 NOTE — Addendum Note (Signed)
Addended by: Jacinta Shoe on: 03/30/2021 10:39 AM   Modules accepted: Orders

## 2021-04-15 ENCOUNTER — Other Ambulatory Visit: Payer: Self-pay

## 2021-04-15 ENCOUNTER — Ambulatory Visit (INDEPENDENT_AMBULATORY_CARE_PROVIDER_SITE_OTHER): Payer: No Typology Code available for payment source | Admitting: *Deleted

## 2021-04-15 DIAGNOSIS — Z7901 Long term (current) use of anticoagulants: Secondary | ICD-10-CM | POA: Diagnosis not present

## 2021-04-15 DIAGNOSIS — Z952 Presence of prosthetic heart valve: Secondary | ICD-10-CM

## 2021-04-15 LAB — POCT INR: INR: 3.8 — AB (ref 2.0–3.0)

## 2021-04-15 NOTE — Patient Instructions (Signed)
Description   Do not take any Warfarin tonight then start taking warfarin 1 tablet daily except 1/2 tablet on Wednesdays. Recheck INR in 3 weeks. Coumadin clinic [phone].

## 2021-05-06 ENCOUNTER — Other Ambulatory Visit: Payer: Self-pay

## 2021-05-06 ENCOUNTER — Telehealth (HOSPITAL_BASED_OUTPATIENT_CLINIC_OR_DEPARTMENT_OTHER): Payer: Self-pay | Admitting: Cardiovascular Disease

## 2021-05-06 ENCOUNTER — Ambulatory Visit (INDEPENDENT_AMBULATORY_CARE_PROVIDER_SITE_OTHER): Payer: No Typology Code available for payment source

## 2021-05-06 DIAGNOSIS — Z952 Presence of prosthetic heart valve: Secondary | ICD-10-CM | POA: Diagnosis not present

## 2021-05-06 DIAGNOSIS — Z5181 Encounter for therapeutic drug level monitoring: Secondary | ICD-10-CM | POA: Diagnosis not present

## 2021-05-06 DIAGNOSIS — Z7901 Long term (current) use of anticoagulants: Secondary | ICD-10-CM

## 2021-05-06 LAB — POCT INR: INR: 3.6 — AB (ref 2.0–3.0)

## 2021-05-06 NOTE — Patient Instructions (Signed)
Description   Continue taking warfarin 1 tablet daily except 1/2 tablet on Wednesdays. Recheck INR in 4 weeks. Coumadin clinic [phone].

## 2021-05-06 NOTE — Telephone Encounter (Signed)
Did not need this encounter °

## 2021-05-21 ENCOUNTER — Other Ambulatory Visit: Payer: Self-pay | Admitting: Cardiovascular Disease

## 2021-05-21 NOTE — Telephone Encounter (Signed)
Prescription refill request received for warfarin Lov: 03/27/21 Johnsie Cancel)  Next INR check: 06/04/21 Warfarin tablet strength: 7.5mg   Appropriate dose and refill sent to requested pharmacy.

## 2021-06-04 ENCOUNTER — Ambulatory Visit (INDEPENDENT_AMBULATORY_CARE_PROVIDER_SITE_OTHER): Payer: No Typology Code available for payment source | Admitting: *Deleted

## 2021-06-04 ENCOUNTER — Other Ambulatory Visit: Payer: Self-pay

## 2021-06-04 DIAGNOSIS — Z952 Presence of prosthetic heart valve: Secondary | ICD-10-CM

## 2021-06-04 DIAGNOSIS — Z7901 Long term (current) use of anticoagulants: Secondary | ICD-10-CM

## 2021-06-04 LAB — POCT INR: INR: 2.4 (ref 2.0–3.0)

## 2021-06-04 NOTE — Patient Instructions (Signed)
Description   Today take 1.5 tablets then continue taking warfarin 1 tablet daily except 1/2 tablet on Wednesdays. Recheck INR in 4 weeks. Coumadin clinic [phone].

## 2021-07-06 ENCOUNTER — Other Ambulatory Visit: Payer: Self-pay

## 2021-07-06 ENCOUNTER — Ambulatory Visit (INDEPENDENT_AMBULATORY_CARE_PROVIDER_SITE_OTHER): Payer: No Typology Code available for payment source | Admitting: *Deleted

## 2021-07-06 DIAGNOSIS — Z952 Presence of prosthetic heart valve: Secondary | ICD-10-CM | POA: Diagnosis not present

## 2021-07-06 DIAGNOSIS — Z7901 Long term (current) use of anticoagulants: Secondary | ICD-10-CM

## 2021-07-06 LAB — POCT INR: INR: 3.2 — AB (ref 2.0–3.0)

## 2021-07-06 NOTE — Patient Instructions (Signed)
Description   Continue taking warfarin 1 tablet daily except 1/2 tablet on Wednesdays. Recheck INR in 4 weeks. Coumadin clinic [phone].

## 2021-07-13 ENCOUNTER — Telehealth: Payer: Self-pay

## 2021-07-13 NOTE — Telephone Encounter (Signed)
Patient will like to re-establish care please advise if ok.

## 2021-07-13 NOTE — Telephone Encounter (Signed)
Patient was scheduled to come in 07-21-2021

## 2021-07-23 ENCOUNTER — Telehealth: Payer: Self-pay

## 2021-07-23 ENCOUNTER — Encounter: Payer: Self-pay | Admitting: Family Medicine

## 2021-07-23 ENCOUNTER — Ambulatory Visit (INDEPENDENT_AMBULATORY_CARE_PROVIDER_SITE_OTHER): Payer: No Typology Code available for payment source | Admitting: Family Medicine

## 2021-07-23 VITALS — BP 122/70 | HR 72 | Temp 97.7°F | Resp 16 | Ht 61.0 in | Wt 131.4 lb

## 2021-07-23 DIAGNOSIS — Z952 Presence of prosthetic heart valve: Secondary | ICD-10-CM | POA: Diagnosis not present

## 2021-07-23 DIAGNOSIS — R55 Syncope and collapse: Secondary | ICD-10-CM | POA: Insufficient documentation

## 2021-07-23 DIAGNOSIS — R072 Precordial pain: Secondary | ICD-10-CM

## 2021-07-23 MED ORDER — METOPROLOL TARTRATE 50 MG PO TABS: 50.0000 mg | ORAL_TABLET | Freq: Once | ORAL | 0 refills | Status: DC

## 2021-07-23 NOTE — Progress Notes (Addendum)
Subjective:   By signing my name below, I, Vanessa Owens, attest that this documentation has been prepared under the direction and in the presence of [name], DO. 07/23/2021      Patient ID: Vanessa Owens, female    DOB: [birth year], 61 y.o.   MRN: [id number]  Chief Complaint  Patient presents with   New Patient (Initial Visit)    Pt was seen in the ED in Feb for dehydration and pt states she was taking to the ED because she passed out.     HPI Patient is in today for a office visit.   She was taken to the hospital on 07/10/2021 due to syncope. She was in the middle of virtual work meeting and passed out. She does not remember too much prior to this episode. She remembers a migraine developing but otherwise she does not remember. She reports being told she was walking and dazed following her episode but she does not remember it. She also reports walking to her bathroom and having a bowel movement while in there following that incident but does not remember it. She does not have any prior history of issues with blood pressure. She reports following her episode and waking up in the hospital she felt dizzy after sitting up in bed. She reports they found she was dehydrated following her episode while in the hospital. She continues having episode of dizziness when laying down or getting up from laying down. She reports doing nothing different in her lifestyle prior to her syncope episode. She reports being on a intermittent fasting diet for the past year. She notes not drinking water regularly through out the day. She continues having episode of loose stools but reports this is typically for her the diet she is currently on. She has a history of heart valve replacement.  She does not remember if she had any chest pain or SOB prior to her episode of syncope. She denies having any congestion prior to her episode of syncope.  She continues seeing her cardiologist and is planning on  scheduling an appointment with them.  She continues having migraines but finds they have decreased in frequency. She does not take migraine medication to manage her symptoms due to finding she does not have them enough. She reports during a migraine she sees an aura which develops into blurry vision in one eye.   Past Medical History:  Diagnosis Date   Anxiety    Aortic stenosis     by Dr. Prescott Gum   Colon polyp    Costochondritis    x2   Depression    Diverticulosis    GERD (gastroesophageal reflux disease)    Glaucoma (increased eye pressure)    HLD (hyperlipidemia)    IBS (irritable bowel syndrome)    Long term current use of anticoagulant    Migraine    OAB (overactive bladder)    Osteoporosis    S/P AVR    2001 for bicuspid AV.....Marland KitchenST JUDE   Shingles    ? diag/rash 11/21/12   Vulvar ulceration    recurrent    Past Surgical History:  Procedure Laterality Date   AORTIC VALVE REPLACEMENT  05/18/1999   St Jude   APPENDECTOMY     post op abdominal wall hematoma/2 transfusions   BREAST BIOPSY Left 09/15/2006   left breast   CESAREAN SECTION     x 2   FINGER FRACTURE SURGERY Left    pinky  Family History  Problem Relation Age of Onset   Hypertension Mother    Diabetes Mother    Hyperlipidemia Mother    Irritable bowel syndrome Mother    Diabetes Brother    Hyperlipidemia Brother    Hypertension Brother    Heart disease Maternal Grandmother    Stroke Maternal Grandmother    Alzheimer's disease Maternal Grandfather    Diabetes Maternal Grandfather    Diabetes Son    Pancreatic cancer Maternal Aunt    Colon cancer Maternal Great-grandmother    Kidney disease Maternal Uncle     Social History   Socioeconomic History   Marital status: Single    Spouse name: Not on file   Number of children: 2   Years of education: Not on file   Highest education level: Not on file  Occupational History   Occupation: benefits specialist    Employer: [employer]   Tobacco Use   Smoking status: Never   Smokeless tobacco: Never  Vaping Use   Vaping Use: Never used  Substance and Sexual Activity   Alcohol use: Yes    Alcohol/week: 1.0 - 2.0 standard drink    Types: 1 - 2 Standard drinks or equivalent per week    Comment: 1-2 per day   Drug use: No   Sexual activity: Yes    Partners: Male    Birth control/protection: Post-menopausal  Other Topics Concern   Not on file  Social History Narrative   Not on file   Social Determinants of Health   Financial Resource Strain: Not on file  Food Insecurity: Not on file  Transportation Needs: Not on file  Physical Activity: Not on file  Stress: Not on file  Social Connections: Not on file  Intimate Partner Violence: Not on file    Outpatient Medications Prior to Visit  Medication Sig Dispense Refill   warfarin (COUMADIN) 7.5 MG tablet TAKE 1 TABLET BY MOUTH DAILY OR AS DIRECTED BY ANTICOAGULATION CLINIC. 100 tablet 0   risedronate (ACTONEL) 35 MG tablet TAKE 1 TABLET (35 MG TOTAL) BY MOUTH EVERY 7 (SEVEN) DAYS. WITH WATER ON EMPTY STOMACH, NOTHING BY MOUTH OR LIE DOWN FOR NEXT 30 MINUTES. (Patient not taking: Reported on 07/23/2021) 12 tablet 1   No facility-administered medications prior to visit.    Allergies  Allergen Reactions   Morphine And Related Nausea Only    Review of Systems  Constitutional:  Negative for fever and malaise/fatigue.  HENT:  Negative for congestion.   Eyes:  Negative for blurred vision.  Respiratory:  Negative for cough and shortness of breath.   Cardiovascular:  Negative for chest pain, palpitations and leg swelling.  Gastrointestinal:  Positive for diarrhea. Negative for vomiting.  Musculoskeletal:  Negative for back pain.  Skin:  Negative for rash.  Neurological:  Positive for dizziness. Negative for loss of consciousness and headaches.       +syncope       Objective:    Physical Exam Vitals and nursing note reviewed.  Constitutional:      General: She  is not in acute distress.    Appearance: Normal appearance. She is not ill-appearing.  HENT:     Head: Normocephalic and atraumatic.     Right Ear: External ear normal.     Left Ear: External ear normal.  Eyes:     Extraocular Movements: Extraocular movements intact.     Pupils: Pupils are equal, round, and reactive to light.  Cardiovascular:     Rate and  Rhythm: Normal rate and regular rhythm.     Heart sounds: Normal heart sounds. No murmur heard.   No gallop.  Pulmonary:     Effort: Pulmonary effort is normal. No respiratory distress.     Breath sounds: Normal breath sounds. No wheezing or rales.  Skin:    General: Skin is warm and dry.  Neurological:     Mental Status: She is alert and oriented to person, place, and time.  Psychiatric:        Judgment: Judgment normal.    BP 122/70 (BP Location: Left Arm, Patient Position: Sitting, Cuff Size: Normal)    Pulse 72    Temp 97.7 F (36.5 C) (Oral)    Resp 16    Ht _0  (1.549 m)    Wt 131 lb 6.4 oz (59.6 kg)    LMP 05/18/2003    SpO2 98%    BMI 24.83 kg/m  Wt Readings from Last 3 Encounters:  07/23/21 131 lb 6.4 oz (59.6 kg)  03/27/21 135 lb (61.2 kg)  02/09/21 139 lb (63 kg)    Diabetic Foot Exam - Simple   No data filed    Lab Results  Component Value Date   WBC 5.2 04/03/2019   HGB 13.8 04/03/2019   HCT 40.0 04/03/2019   PLT 181 04/03/2019   GLUCOSE 91 02/09/2021   CHOL 205 (H) 02/09/2021   TRIG 112 02/09/2021   HDL 59 02/09/2021   LDLCALC 126 (H) 02/09/2021   ALT 16 02/09/2021   AST 21 02/09/2021   NA 139 02/09/2021   K 3.9 02/09/2021   CL 102 02/09/2021   CREATININE 0.80 02/09/2021   BUN 11 02/09/2021   CO2 24 02/09/2021   TSH 3.530 04/03/2019   INR 3.2 (A) 07/06/2021   HGBA1C 5.2 06/28/2013    Lab Results  Component Value Date   TSH 3.530 04/03/2019   Lab Results  Component Value Date   WBC 5.2 04/03/2019   HGB 13.8 04/03/2019   HCT 40.0 04/03/2019   MCV 90 04/03/2019   PLT 181 04/03/2019    Lab Results  Component Value Date   NA 139 02/09/2021   K 3.9 02/09/2021   CO2 24 02/09/2021   GLUCOSE 91 02/09/2021   BUN 11 02/09/2021   CREATININE 0.80 02/09/2021   BILITOT 0.8 02/09/2021   ALKPHOS 90 02/09/2021   AST 21 02/09/2021   ALT 16 02/09/2021   PROT 7.2 02/09/2021   ALBUMIN 4.7 02/09/2021   CALCIUM 9.1 02/09/2021   EGFR 85 02/09/2021   GFR 92.18 08/18/2017   Lab Results  Component Value Date   CHOL 205 (H) 02/09/2021   Lab Results  Component Value Date   HDL 59 02/09/2021   Lab Results  Component Value Date   LDLCALC 126 (H) 02/09/2021   Lab Results  Component Value Date   TRIG 112 02/09/2021   Lab Results  Component Value Date   CHOLHDL 3.5 02/09/2021   Lab Results  Component Value Date   HGBA1C 5.2 06/28/2013       Assessment & Plan:   Problem List Items Addressed This Visit       Unprioritized   H/O aortic valve replacement    Over 10 years ago Due to recent episode of syncope ---  Cardiology was contacted and cardiac ct will be scheduled       Near syncope - Primary    [facility] records reviewed  Labs , ct all normal   Check  labs  If any worsening of symptoms to to [facility]  Cardiology contacted as well      Relevant Orders   CBC with Differential/Platelet   Comprehensive metabolic panel   TSH   Vitamin B12   VITAMIN D 25 Hydroxy (Vit-D Deficiency, Fractures)   Lipid panel     No orders of the defined types were placed in this encounter.   IAnn Held, DO, personally preformed the services described in this documentation.  All medical record entries made by the scribe were at my direction and in my presence.  I have reviewed the chart and discharge instructions (if applicable) and agree that the record reflects my personal performance and is accurate and complete. 07/23/2021   I,Vanessa Owens,acting as a scribe for [name], DO.,have documented all relevant documentation on the behalf of [name], DO,as directed by  [name], DO while in the presence of [name], DO.   [name], DO

## 2021-07-23 NOTE — Telephone Encounter (Signed)
Per Dr. Johnsie Cancel, arrange for her to have a cardiac CT to look at aorta  valve and cors.  Do retrospective from clavicles down with nitro. ? ?Called patient to let her know Dr. Johnsie Cancel wants to order CT. Informed patient that instructions for CT scan will be sent through [facility]. Patient verbalized understanding. Patient had CMET done today, so no BMET needed prior to CT. ?

## 2021-07-23 NOTE — Assessment & Plan Note (Signed)
[facility] records reviewed  ?Labs , ct all normal  ? ?Check labs  ?If any worsening of symptoms to to [facility]  ?Cardiology contacted as well ?

## 2021-07-23 NOTE — Assessment & Plan Note (Signed)
Over 10 years ago ?Due to recent episode of syncope ---  Cardiology was contacted and cardiac ct will be scheduled  ?

## 2021-07-23 NOTE — Patient Instructions (Signed)
Syncope, Adult ?Syncope refers to a condition in which a person temporarily loses consciousness. Syncope may also be called fainting or passing out. It is caused by a sudden decrease in blood flow to the brain. This can happen for a variety of reasons. ?Most causes of syncope are not dangerous. It can be triggered by things such as needle sticks, seeing blood, pain, or intense emotion. However, syncope can also be a sign of a serious medical problem, such as a heart abnormality. Other causes can include dehydration, migraines, or taking medicines that lower blood pressure. Your health care provider may do tests to find the reason why you are having syncope. ?If you faint, get medical help right away. Call your local emergency services (911 in the [place] ?Follow these instructions at home: ?Pay attention to any changes in your symptoms. Take these actions to stay safe and to help relieve your symptoms: ?Knowing when you may be about to faint ?Signs that you may be about to faint include: ?Feeling dizzy, weak, light-headed, or like the room is spinning. ?Feeling nauseous. ?Seeing spots or seeing all white or all black in your field of vision. ?Having cold, clammy skin or feeling warm and sweaty. ?Hearing ringing in the ears (tinnitus). ?If you start to feel like you might faint, sit or lie down right away. If sitting, put your head down between your legs. If lying down, raise (elevate) your feet above the level of your heart. ?Breathe deeply and steadily. Wait until all the symptoms have passed. ?Have someone stay with you until you feel stable. ?Medicines ?Take over-the-counter and prescription medicines only as told by your health care provider. ?If you are taking blood pressure or heart medicine, get up slowly and take several minutes to sit and then stand. This can reduce dizziness and decrease the risk of syncope. ?Lifestyle ?Do not drive, use machinery, or play sports until your health care provider says it is  okay. ?Do not drink alcohol. ?Do not use any products that contain nicotine or tobacco. These products include cigarettes, chewing tobacco, and vaping devices, such as e-cigarettes. If you need help quitting, ask your health care provider. ?Avoid hot tubs and saunas. ?General instructions ?Talk with your health care provider about your symptoms. You may need to have testing to understand the cause of your syncope. ?Drink enough fluid to keep your urine pale yellow. ?Avoid prolonged standing. If you must stand for a long time, do movements such as: ?Moving your legs. ?Crossing your legs. ?Flexing and stretching your leg muscles. ?Squatting. ?Keep all follow-up visits. This is important. ?Contact a health care provider if: ?You have episodes of near fainting. ?Get help right away if: ?You faint. ?You hit your head or are injured after fainting. ?You have any of these symptoms that may indicate trouble with your heart: ?Fast or irregular heartbeats (palpitations). ?Unusual pain in your chest, abdomen, or back. ?Shortness of breath. ?You have a seizure. ?You have a severe headache. ?You are confused. ?You have vision problems. ?You have severe weakness or trouble walking. ?You are bleeding from your mouth or rectum, or you have black or tarry stool. ?These symptoms may represent a serious problem that is an emergency. Do not wait to see if your symptoms will go away. Get medical help right away. Call your local emergency services (911 in the [place] Do not drive yourself to the hospital. ?Summary ?Syncope refers to a condition in which a person temporarily loses consciousness. Syncope may also be called fainting  or passing out. It is caused by a sudden decrease in blood flow to the brain. ?Signs that you may be about to faint include dizziness, feeling light-headed, feeling nauseous, sudden vision changes, or cold, clammy skin. ?Even though most causes of syncope are not dangerous, syncope can be a sign of a serious  medical problem. Get help right away if you faint. ?If you start to feel like you might faint, sit or lie down right away. If sitting, put your head down between your legs. If lying down, raise (elevate) your feet above the level of your heart. ?This information is not intended to replace advice given to you by your health care provider. Make sure you discuss any questions you have with your health care provider. ?Document Revised: 09/11/2020 Document Reviewed: 09/11/2020 ?Elsevier Patient Education ? [place]. ? ?

## 2021-07-24 LAB — COMPREHENSIVE METABOLIC PANEL
ALT: 17 U/L (ref 0–35)
AST: 20 U/L (ref 0–37)
Albumin: 4.6 g/dL (ref 3.5–5.2)
Alkaline Phosphatase: 93 U/L (ref 39–117)
BUN: 11 mg/dL (ref 6–23)
CO2: 31 mEq/L (ref 19–32)
Calcium: 9.4 mg/dL (ref 8.4–10.5)
Chloride: 101 mEq/L (ref 96–112)
Creatinine, Ser: 0.73 mg/dL (ref 0.40–1.20)
GFR: 90.03 mL/min (ref >60.00)
Glucose, Bld: 119 mg/dL — ABNORMAL HIGH (ref 70–99)
Potassium: 3.7 mEq/L (ref 3.5–5.1)
Sodium: 139 mEq/L (ref 135–145)
Total Bilirubin: 1.3 mg/dL — ABNORMAL HIGH (ref 0.2–1.2)
Total Protein: 7.1 g/dL (ref 6.0–8.3)

## 2021-07-24 LAB — CBC WITH DIFFERENTIAL/PLATELET
Basophils Absolute: 0.1 10*3/uL (ref 0.0–0.1)
Basophils Relative: 1.2 % (ref 0.0–3.0)
Eosinophils Absolute: 0.1 10*3/uL (ref 0.0–0.7)
Eosinophils Relative: 2.7 % (ref 0.0–5.0)
HCT: 39.3 % (ref 36.0–46.0)
Hemoglobin: 13.3 g/dL (ref 12.0–15.0)
Lymphocytes Relative: 38.1 % (ref 12.0–46.0)
Lymphs Abs: 2 10*3/uL (ref 0.7–4.0)
MCHC: 33.8 g/dL (ref 30.0–36.0)
MCV: 91.7 fl (ref 78.0–100.0)
Monocytes Absolute: 0.4 10*3/uL (ref 0.1–1.0)
Monocytes Relative: 7.2 % (ref 3.0–12.0)
Neutro Abs: 2.7 10*3/uL (ref 1.4–7.7)
Neutrophils Relative %: 50.8 % (ref 43.0–77.0)
Platelets: 189 10*3/uL (ref 150.0–400.0)
RBC: 4.29 Mil/uL (ref 3.87–5.11)
RDW: 13.7 % (ref 11.5–15.5)
WBC: 5.2 10*3/uL (ref 4.0–10.5)

## 2021-07-24 LAB — LIPID PANEL
Cholesterol: 221 mg/dL — ABNORMAL HIGH (ref 0–200)
HDL: 69.3 mg/dL (ref >39.00)
LDL Cholesterol: 131 mg/dL — ABNORMAL HIGH (ref 0–99)
NonHDL: 152.11
Total CHOL/HDL Ratio: 3
Triglycerides: 106 mg/dL (ref 0.0–149.0)
VLDL: 21.2 mg/dL (ref 0.0–40.0)

## 2021-07-24 LAB — VITAMIN B12: Vitamin B-12: 271 pg/mL (ref 211–911)

## 2021-07-24 LAB — TSH: TSH: 2.69 u[IU]/mL (ref 0.35–5.50)

## 2021-07-24 LAB — VITAMIN D 25 HYDROXY (VIT D DEFICIENCY, FRACTURES): VITD: 20.84 ng/mL — ABNORMAL LOW (ref 30.00–100.00)

## 2021-07-27 ENCOUNTER — Other Ambulatory Visit: Payer: Self-pay

## 2021-07-27 ENCOUNTER — Telehealth: Payer: Self-pay

## 2021-07-27 MED ORDER — VITAMIN D (ERGOCALCIFEROL) 1.25 MG (50000 UNIT) PO CAPS: 50000.0000 [IU] | ORAL_CAPSULE | ORAL | 1 refills | Status: DC

## 2021-07-27 NOTE — Telephone Encounter (Signed)
Pt states she has decided to start the actonel. Pt will also begin taking Vit D and B12 supplement.  ? ?

## 2021-07-29 ENCOUNTER — Other Ambulatory Visit: Payer: Self-pay | Admitting: Obstetrics & Gynecology

## 2021-07-29 DIAGNOSIS — M810 Age-related osteoporosis without current pathological fracture: Secondary | ICD-10-CM

## 2021-08-03 ENCOUNTER — Ambulatory Visit (INDEPENDENT_AMBULATORY_CARE_PROVIDER_SITE_OTHER): Payer: No Typology Code available for payment source | Admitting: *Deleted

## 2021-08-03 ENCOUNTER — Telehealth (HOSPITAL_COMMUNITY): Payer: Self-pay | Admitting: Emergency Medicine

## 2021-08-03 ENCOUNTER — Other Ambulatory Visit: Payer: Self-pay

## 2021-08-03 DIAGNOSIS — Z7901 Long term (current) use of anticoagulants: Secondary | ICD-10-CM

## 2021-08-03 DIAGNOSIS — Z5181 Encounter for therapeutic drug level monitoring: Secondary | ICD-10-CM | POA: Diagnosis not present

## 2021-08-03 LAB — POCT INR: INR: 2.6 (ref 2.0–3.0)

## 2021-08-03 NOTE — Patient Instructions (Signed)
Description   ?Continue taking warfarin 1 tablet daily except 1/2 tablet on Wednesdays. Recheck INR in 5 weeks. Coumadin clinic [phone].  ?  ? ? ?

## 2021-08-03 NOTE — Telephone Encounter (Signed)
Reaching out to patient to offer assistance regarding upcoming cardiac imaging study; pt verbalizes understanding of appt date/time, parking situation and where to check in, pre-test NPO status and medications ordered, and verified current allergies; name and call back number provided for further questions should they arise ?Marchia Bond RN Navigator Cardiac Imaging ?[facility] ?[phone] office ?[phone] cell ? ?Rolling veins ?'50mg'$  metoprolol ?Arrival 730 ? ?

## 2021-08-05 ENCOUNTER — Other Ambulatory Visit: Payer: Self-pay

## 2021-08-05 ENCOUNTER — Ambulatory Visit (HOSPITAL_COMMUNITY)
Admission: RE | Admit: 2021-08-05 | Discharge: 2021-08-05 | Disposition: A | Discharge status: Home / Self Care | Payer: No Typology Code available for payment source | Source: Ambulatory Visit | Attending: Cardiovascular Disease | Admitting: Cardiovascular Disease

## 2021-08-05 ENCOUNTER — Encounter (HOSPITAL_COMMUNITY): Payer: Self-pay

## 2021-08-05 DIAGNOSIS — R072 Precordial pain: Secondary | ICD-10-CM

## 2021-08-05 MED ORDER — NITROGLYCERIN 0.4 MG SL SUBL
0.8000 mg | SUBLINGUAL_TABLET | Freq: Once | SUBLINGUAL | Status: AC
Start: 2021-08-05 — End: 2021-08-05
  Administered 2021-08-05: 0.8 mg via SUBLINGUAL

## 2021-08-05 MED ORDER — IOHEXOL 350 MG/ML SOLN
100.0000 mL | Freq: Once | INTRAVENOUS | Status: AC | PRN
Start: 2021-08-05 — End: 2021-08-05
  Administered 2021-08-05: 100 mL via INTRAVENOUS

## 2021-08-05 MED ORDER — NITROGLYCERIN 0.4 MG SL SUBL
SUBLINGUAL_TABLET | SUBLINGUAL | Status: AC
  Filled 2021-08-05: qty 2

## 2021-08-18 ENCOUNTER — Other Ambulatory Visit: Payer: Self-pay | Admitting: Cardiovascular Disease

## 2021-08-23 ENCOUNTER — Inpatient Hospital Stay
Admit: 2021-08-23 | Discharge: 2021-08-23 | Disposition: A | Discharge status: Home / Self Care | Payer: PRIVATE HEALTH INSURANCE | Attending: Emergency Medicine

## 2021-08-23 ENCOUNTER — Emergency Department: Admit: 2021-08-23 | Payer: PRIVATE HEALTH INSURANCE

## 2021-08-23 DIAGNOSIS — R569 Unspecified convulsions: Principal | ICD-10-CM

## 2021-08-23 LAB — CBC WITH AUTO DIFFERENTIAL
Basophils: 0.4 % (ref 0–3)
Eosinophils: 2.5 % (ref 0–5)
Hematocrit: 37.9 % (ref 37.0–50.0)
Hemoglobin: 12.9 gm/dl — ABNORMAL LOW (ref 13.0–17.2)
Immature Granulocytes: 0.4 % (ref 0.0–3.0)
Lymphocytes: 41.8 % (ref 28–48)
MCH: 30.6 pg (ref 25.4–34.6)
MCHC: 34 gm/dl (ref 30.0–36.0)
MCV: 90 fL (ref 80.0–98.0)
MPV: 10.8 fL — ABNORMAL HIGH (ref 6.0–10.0)
Monocytes: 6 % (ref 1–13)
Neutrophils Segmented: 48.9 % (ref 34–64)
Nucleated RBCs: 0 (ref 0–0)
Platelets: 172 10*3/uL (ref 140–450)
RBC: 4.21 M/uL (ref 3.60–5.20)
RDW: 41 (ref 36.4–46.3)
WBC: 5.5 10*3/uL (ref 4.0–11.0)

## 2021-08-23 LAB — POCT URINALYSIS DIPSTICK
Bilirubin, Urine: NEGATIVE
Blood, Urine: NEGATIVE
Glucose, Ur: NEGATIVE mg/dl
Ketones, Urine: NEGATIVE mg/dl
Nitrite, Urine: NEGATIVE
Protein, Urine: NEGATIVE mg/dl
Specific Gravity, Urine: 1.015 (ref 1.005–1.030)
Urobilinogen, Urine: 0.2 EU/dl (ref 0.0–1.0)
pH, Urine: 7.5 (ref 5–9)

## 2021-08-23 LAB — EKG 12-LEAD
Atrial Rate: 78 {beats}/min
Calculated P Axis: 48 degrees
Calculated R Axis: 5 degrees
Calculated T Axis: 46 degrees
DIAGNOSIS, 93000: NORMAL
P-R Interval: 168 ms
Q-T Interval: 406 ms
QRS Duration: 82 ms
QTC Calculation (Bezet): 462 ms
Ventricular Rate: 78 {beats}/min

## 2021-08-23 LAB — MICROSCOPIC URINALYSIS

## 2021-08-23 LAB — BASIC METABOLIC PANEL
Anion Gap: 10 mmol/L (ref 5–15)
BUN: 15 mg/dl (ref 9–23)
CO2: 26 mEq/L (ref 20–31)
Calcium: 9.3 mg/dl (ref 8.7–10.4)
Chloride: 105 mEq/L (ref 98–107)
Creatinine: 0.82 mg/dl (ref 0.55–1.02)
GFR African American: >60
GFR Non-African American: >60
Glucose: 122 mg/dl — ABNORMAL HIGH (ref 74–106)
Potassium: 3.8 mEq/L (ref 3.5–5.1)
Sodium: 141 mEq/L (ref 136–145)

## 2021-08-23 LAB — PROTIME-INR
INR: 2.6 — ABNORMAL HIGH (ref 0.1–1.1)
Protime: 31.3 seconds — ABNORMAL HIGH (ref 10.2–12.9)

## 2021-08-23 LAB — LACTIC ACID: Lactate: 0.6 mmol/L (ref 0.5–2.2)

## 2021-08-23 LAB — MAGNESIUM: Magnesium: 2.2 mg/dl (ref 1.6–2.6)

## 2021-08-23 LAB — POCT GLUCOSE: POC Glucose: 108 mg/dL — ABNORMAL HIGH (ref 65–105)

## 2021-08-23 MED ORDER — SODIUM CHLORIDE 0.9 % IV BOLUS
0.9 % | Freq: Once | INTRAVENOUS | Status: AC
Start: 2021-08-23 — End: 2021-08-23
  Administered 2021-08-23: 07:00:00 1000 mL via INTRAVENOUS

## 2021-08-23 MED ORDER — ACETAMINOPHEN 10 MG/ML IV SOLN
10 MG/ML | Freq: Four times a day (QID) | INTRAVENOUS | Status: DC | PRN
Start: 2021-08-23 — End: 2021-08-23
  Administered 2021-08-23: 07:00:00 1000 mg via INTRAVENOUS

## 2021-08-23 MED ORDER — ONDANSETRON HCL 4 MG/2ML IJ SOLN
4 MG/2ML | Freq: Once | INTRAMUSCULAR | Status: AC
Start: 2021-08-23 — End: 2021-08-23
  Administered 2021-08-23: 07:00:00 4 mg via INTRAVENOUS

## 2021-08-23 MED FILL — ONDANSETRON HCL 4 MG/2ML IJ SOLN: 4 MG/2ML | INTRAMUSCULAR | Qty: 2

## 2021-08-23 MED FILL — ACETAMINOPHEN 10 MG/ML IV SOLN: 10 MG/ML | INTRAVENOUS | Qty: 100

## 2021-08-23 NOTE — ED Triage Notes (Signed)
Pt arrives via ems.  Mother heard her fall out of bed and then saw her having a seizure.  Denies any known hx of seizures.  Pt having headache, nausea, SOB.  Pt did have urinary incontinence.   Glucose 145 per ems  Pt is alert and oriented on arrival.

## 2021-08-23 NOTE — ED Notes (Signed)
Pt was disconnected from the monitor to ambulate to the bathroom. No further assistance needed.      Jasmine Awe, RN  08/23/21 [phone]

## 2021-08-23 NOTE — ED Provider Notes (Signed)
[facility]  Emer[facility] Treatment Report        Patient: Charlene PesterDeborah Spagna Age: 61 y.o. Sex: female    Date of Birth: 13-Aug-1960 Admit Date: 08/23/2021 PCP: None None   MRN: [id number]  CSN: [id number]     Room: ER30/ER30 Time Dictated: 7:26 AM            Chief Complaint   Chief Complaint   Patient presents with    Seizures       History of Present Illness   This is a 61 y.o. 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  She is staying with her mother there is sleeping in the same bed she fell out of bed, had shaking episode of arms and legs and seemed confused after that stopped, this may have lasted about 5 minutes, denies history of seizure disorder.  She reports headache currently but was feeling well prior to this.  Denies antecedent fever cough or cold symptoms no change in medications.  She does not fit history of seizures but she reports that she was diagnosed with dehydration 1 month ago, she was on a Zoom call and fell to the ground, her cardiac work-up was unremarkable she had a CAT scan of her heart that was negative, they had recommended her to see a neurologist for presumably for possible seizure.  Denies arm or leg weakness.  She has nauseous no antecedent vomiting or diarrhea no complaints at this time.  She does endorse urinary incontinence after this event    Review of Systems   Review of Systems    As documented in HPI  Past Medical/Surgical History   No past medical history on file.  No past surgical history on file.    Social History     Social History     Socioeconomic History    Marital status: Divorced       Family History   No family history on file.    Current Medications     Prior to Admission medications    Not on File        Allergies     Allergies   Allergen Reactions    Morphine        Physical Exam/ED Course / Medical Decision Making   Patient Vitals for the past 24 hrs:   Temp  Pulse Resp BP SpO2   08/23/21 0530 -- 74 17 (!) 112/58 93 %   08/23/21 0500 -- 63 19 (!) 108/59 92 %   08/23/21 0430 -- 64 18 (!) 112/50 93 %   08/23/21 0405 -- 82 24 124/65 95 %   08/23/21 0335 97.6 F (36.4 C) -- -- -- --   08/23/21 0330 -- 79 14 128/69 93 %   08/23/21 0300 -- 84 18 128/69 93 %   08/23/21 0230 -- 78 15 138/64 93 %   08/23/21 0115 -- 90 20 123/67 98 %         Physical exam:  General: Well-developed, well-nourished, no apparent distress.    Head: Normocephalic atraumatic.    Eyes: Pupils midrange extraocular movements intact.  No pallor or conjunctival injection.    Nose: No rhinorrhea, inspection grossly normal.    Ears: Grossly normal to inspection, no discharge.    Mouth: Mucous membranes moist, no appreciable intraoral lesion.    Neck/Back: Trachea midline, no asymmetry.    Chest: Grossly normal inspection, symmetric  chest rise.    Pulmonary: Clear to auscultation bilaterally no wheezes rhonchi or rales.    Cardiovascular: S1-S2 no murmurs rubs or gallops.    Abdomen: Soft, nontender, nondistended no guarding rebound or peritoneal signs.    Extremities: Grossly normal to inspection, peripheral pulses intact  Neurologic: Alert and oriented no appreciable focal neurologic deficit smile symmetric arms and legs move equal bilaterally  Skin: Warm and dry  Psychiatric: Grossly normal mood and affect  Nursing note reviewed, vital signs reviewed.    ED course:  Patient presents for evaluation of likely new seizure with urinary incontinence, could have hit her head, rule out intracranial hemorrhage given her Coumadin usage.  Consider new seizure, this may have even happened 1 month ago.  Will consult neurology as she has high risk with her mechanical heart valve and cannot be taken off of anticoagulation.  Now the second fall/seizure with head injury in 2 months    Differential diagnosis: As above    EKG per my interpretation done at 0316 hrs. no signs of heart rate 78 no ST changes no ectopy.    Labs  unremarkable    CT head per my interpretation no hemorrhage     Consult:  Discussed care with Dr. Thedore Mins with neurology. Standard discussion; including history of patients chief complaint, available diagnostic results, and treatment course.  Agrees that should have a inpatient work-up to expedite evaluation    Patient understands plan however she refuses admission, she would rather follow-up with neurology as previously suggested.  Did discuss my concern that she is anticoagulated unable to reverse this and if she has a traumatic injury such as a head injury she would be at significant risk.    Patient understands, she had this conversation with mother and will.  She declines admission and will rather follow-up with her own neurologist in [place].  Do not agree with her decision however there is no indication of force her against her will to be admitted     I discussed with the patient about maintaining strict seizure/syncope precautions and advised to avoid all possible situations where the patient may get hurt if the patient blacks out or has a seizure.  I advised to avoid unprotected heights, heavy machinery, underwater activity without supervision and not to drive or operate any vehicle for at least 6 months of blackout or seizure-free.    Patient's presentation, history, physical exam and laboratory evaluations were reviewed.  At this time patient was felt to be stable for outpatient management and follow with primary care/specialist.  Patient was instructed to return to the emergency department with any concerns.    Disposition:    Discharged home      Please note that this dictation was completed with Dragon, the computer voice recognition software.  Quite often unanticipated grammatical, syntax, homophones, and other interpretive errors are inadvertently transcribed by the computer software.  Please disregard these errors.  Please excuse any errors that have escaped final proofreading.          ED  Course as of 08/23/21 0726   Wynelle Link Aug 23, 2021   0218 CT head per my interpretation no hemorrhage     [CS]      ED Course User Index  [CS] Despina Hick, MD       RECORDS REVIEWED:  I reviewed the patient's previous records here at [facility] and available outside facilities and note that     EXTERNAL RESULTS REVIEWED:  INDEPENDENT HISTORIAN:  History and/or plan development assisted by: Mother, neurology consult    Severe exacerbation or progression of chronic illness:        Threat to body function without evaluation and management:    Possible untreated seizure declining inpatient work-up    SOCIAL DETERMINANTS  impacting Evaluation and Management:   Outpatient services not available at this time of day.   Possible delay in neurology referral    Comorbidities impacting Evaluation and Management: Anticoagulated    Final Diagnosis       ICD-10-CM    1. Seizure (HCC)  R56.9       2. Anticoagulated on warfarin  Z79.01              DISPOSITION Decision To Discharge 08/23/2021 05:37:29 AM     Diagnostic Studies   Lab:   Recent Results (from the past 12 hour(s))   BMP    Collection Time: 08/23/21  1:30 AM   Result Value Ref Range    Potassium 3.8 3.5 - 5.1 mEq/L    Chloride 105 98 - 107 mEq/L    Sodium 141 136 - 145 mEq/L    CO2 26 20 - 31 mEq/L    Glucose 122 (H) 74 - 106 mg/dl    BUN 15 9 - 23 mg/dl    Creatinine 5.57 3.22 - 1.02 mg/dl    GFR African American >60.0      GFR Non-African American >60      Calcium 9.3 8.7 - 10.4 mg/dl    Anion Gap 10 5 - 15 mmol/L   CBC with Auto Differential    Collection Time: 08/23/21  1:30 AM   Result Value Ref Range    WBC 5.5 4.0 - 11.0 1000/mm3    RBC 4.21 3.60 - 5.20 M/uL    Hemoglobin 12.9 (L) 13.0 - 17.2 gm/dl    Hematocrit 02.5 42.7 - 50.0 %    MCV 90.0 80.0 - 98.0 fL    MCH 30.6 25.4 - 34.6 pg    MCHC 34.0 30.0 - 36.0 gm/dl    Platelets 062 376 - 450 1000/mm3    MPV 10.8 (H) 6.0 - 10.0 fL    RDW 41.0 36.4 - 46.3      Nucleated RBCs 0 0 - 0      Immature Granulocytes 0.4 0.0 -  3.0 %    Neutrophils Segmented 48.9 34 - 64 %    Lymphocytes 41.8 28 - 48 %    Monocytes 6.0 1 - 13 %    Eosinophils 2.5 0 - 5 %    Basophils 0.4 0 - 3 %   Magnesium    Collection Time: 08/23/21  1:30 AM   Result Value Ref Range    Magnesium 2.2 1.6 - 2.6 mg/dl   Lactic Acid    Collection Time: 08/23/21  3:11 AM   Result Value Ref Range    Lactate 0.6 0.5 - 2.2 mmol/L   Protime-INR    Collection Time: 08/23/21  3:11 AM   Result Value Ref Range    Protime 31.3 (H) 10.2 - 12.9 seconds    INR 2.6 (H) 0.1 - 1.1     POCT Glucose    Collection Time: 08/23/21  3:33 AM   Result Value Ref Range    POC Glucose 108 (H) 65 - 105 mg/dL   POCT Urinalysis no Micro    Collection Time: 08/23/21  4:08 AM   Result Value Ref Range  Glucose, Ur Negative NEGATIVE,Negative mg/dl    Bilirubin, Urine Negative NEGATIVE,Negative      Ketones, Urine Negative NEGATIVE,Negative mg/dl    Specific Gravity, Urine 1.015 1.005 - 1.030      Blood, Urine Negative NEGATIVE,Negative      pH, Urine 7.5 5 - 9      Protein, Urine Negative NEGATIVE,Negative mg/dl    Urobilinogen, Urine 0.2 0.0 - 1.0 EU/dl    Nitrite, Urine Negative NEGATIVE,Negative      Leukocyte Esterase, Urine Trace (A) NEGATIVE,Negative      Color, UA Yellow      Clarity, UA Clear     Microscopic Urinalysis    Collection Time: 08/23/21  4:08 AM   Result Value Ref Range    Squam Epithel, UA 1-4 /LPF    WBC, UA 1-4 /HPF    RBC, UA OCCASIONAL /HPF    BACTERIA, URINE 1+ /HPF       Imaging:    CT HEAD WO CONTRAST   Final Result   IMPRESSION:   Normal unenhanced CT scan of the head.      Electronically signed by: Jolayne Panther, MD 08/23/2021 2:17 AM EDT                       New medications  There are no discharge medications for this patient.         Results of lab/radiology tests were discussed with the patient. . All questions were answered and concerns addressed.      Candie Echevaria, MD    My signature above authenticates this document and my orders, the final    diagnosis (es), discharge  prescription (s), and instructions in the Epic record.  If you have any questions please contact [phone].                       Despina Hick, MD  08/23/21 [phone]

## 2021-08-23 NOTE — Discharge Instructions (Addendum)
You have declined to be admitted but please return at any time if you wish to complete your seizure work-up.  You are at higher risk because of your mechanical heart valve which requires blood thinner.  You are at high risk for injury given the suspected seizure disorder

## 2021-08-23 NOTE — ED Notes (Signed)
Medicate pt per MAR. No further assistance is needed.      Jasmine Awe, RN  08/23/21 [phone]

## 2021-09-08 ENCOUNTER — Ambulatory Visit (INDEPENDENT_AMBULATORY_CARE_PROVIDER_SITE_OTHER): Payer: No Typology Code available for payment source | Admitting: Pharmacist

## 2021-09-08 DIAGNOSIS — Z7901 Long term (current) use of anticoagulants: Secondary | ICD-10-CM | POA: Diagnosis not present

## 2021-09-08 DIAGNOSIS — Z952 Presence of prosthetic heart valve: Secondary | ICD-10-CM

## 2021-09-08 LAB — POCT INR: INR: 5.5 — AB (ref 2.0–3.0)

## 2021-09-08 NOTE — Patient Instructions (Signed)
Description   ?Skip your Coumadin today and tomorrow, then continue taking 1 tablet daily except 1/2 tablet on Wednesdays. Recheck INR in 2 weeks. Coumadin clinic [phone].  ?  ? ? ?

## 2021-09-16 ENCOUNTER — Telehealth: Payer: Self-pay | Admitting: Family Medicine

## 2021-09-16 NOTE — Telephone Encounter (Signed)
Pt seen on 07/23/21 for syncope? Was a neuro referral discussed? Pt was seen in the ED recently for a seizure ?

## 2021-09-16 NOTE — Telephone Encounter (Signed)
Pt states she is needing a referral to neurology. Pt stated this has already been discussed and was just waiting on ct scan to come back clear.   ?

## 2021-09-17 ENCOUNTER — Other Ambulatory Visit: Payer: Self-pay | Admitting: Family Medicine

## 2021-09-17 DIAGNOSIS — R569 Unspecified convulsions: Secondary | ICD-10-CM

## 2021-09-21 NOTE — Telephone Encounter (Signed)
Noted  

## 2021-09-22 ENCOUNTER — Ambulatory Visit (INDEPENDENT_AMBULATORY_CARE_PROVIDER_SITE_OTHER): Payer: No Typology Code available for payment source

## 2021-09-22 DIAGNOSIS — Z7901 Long term (current) use of anticoagulants: Secondary | ICD-10-CM

## 2021-09-22 DIAGNOSIS — Z952 Presence of prosthetic heart valve: Secondary | ICD-10-CM

## 2021-09-22 LAB — POCT INR: INR: 3.1 — AB (ref 2.0–3.0)

## 2021-09-22 NOTE — Patient Instructions (Signed)
Description   ?Continue taking 1 tablet daily except 1/2 tablet on Wednesdays. Recheck INR in 3 weeks. Coumadin clinic [phone].  ?  ?  ?

## 2021-10-05 ENCOUNTER — Ambulatory Visit (INDEPENDENT_AMBULATORY_CARE_PROVIDER_SITE_OTHER): Payer: No Typology Code available for payment source | Admitting: Neurology

## 2021-10-05 ENCOUNTER — Encounter: Payer: Self-pay | Admitting: Neurology

## 2021-10-05 VITALS — BP 153/75 | HR 67 | Ht 61.0 in | Wt 135.4 lb

## 2021-10-05 DIAGNOSIS — E559 Vitamin D deficiency, unspecified: Secondary | ICD-10-CM | POA: Diagnosis not present

## 2021-10-05 DIAGNOSIS — G40909 Epilepsy, unspecified, not intractable, without status epilepticus: Secondary | ICD-10-CM | POA: Diagnosis not present

## 2021-10-05 DIAGNOSIS — E538 Deficiency of other specified B group vitamins: Secondary | ICD-10-CM

## 2021-10-05 MED ORDER — LEVETIRACETAM 500 MG PO TABS: 500.0000 mg | ORAL_TABLET | Freq: Two times a day (BID) | ORAL | 11 refills | Status: DC

## 2021-10-05 NOTE — Progress Notes (Signed)
[facility]  PATIENT: Vanessa Owens DOB: 1960/09/11  REQUESTING CLINICIAN: Carollee Herter, Alferd Apa, * HISTORY FROM: Patient  REASON FOR VISIT: Seizure    HISTORICAL  CHIEF COMPLAINT:  Chief Complaint  Patient presents with   New Patient (Initial Visit)    Rm 15. Alone. NP Internal referral for seizures.    HISTORY OF PRESENT ILLNESS:  This is a 61 year old woman past medical history of aortic valve replacement, osteoporosis, vitamin D and vitamin B12 deficiency who is presenting with concern of seizures.  Patient reports on February 24, she was in a virtual meeting then next thing she collapsed and fell to the floor.  When EMS arrived, she was walk around the house but patient reported she does not remember anything of that.  She was taken to the ED, had a head CT that was negative for any acute abnormality.  She was also noted to have urinary and bowel incontinence.  She was diagnosed with dehydration and discharged home with follow-up.  She did follow-up with her primary care doctor, follow-up with her cardiologist, had a cardiac CT scan and everything was okay.  Patient reports on Easter weekend she went to [place] to visit her mom, in the middle of the night she fell on the floor, mother reported some shaking of the upper and lower extremities with some confusion and EMS was called.  She also at that time has urinary incontinence.  In the ED head CT was negative for any acute abnormality, patient was recommended admission for further evaluation but she declined.  The event on Easter weekend was her last event.  She denies any seizure risk factor, denies any family history of seizure.  Other than the fall and bruises around her head she denies any other injury, no tongue biting.   Handedness: Right handed   Onset: February 24  Seizure Type: Unclear, possibly generalized  Current frequency: 2 events  Any injuries from seizures: Bruises  Seizure risk  factors: Denies  Previous ASMs: None  Currenty ASMs: None  ASMs side effects: Not applicable  Brain Images: Normal head CT  Previous EEGs: None   OTHER MEDICAL CONDITIONS: Aortic valve replacement, Osteoporosis, Vitamin D deficiency, Vitamin B12 deficiency   REVIEW OF SYSTEMS: Full 14 system review of systems performed and negative with exception of: as noted in the HPI   ALLERGIES: Allergies  Allergen Reactions   Morphine And Related Nausea Only    HOME MEDICATIONS: Outpatient Medications Prior to Visit  Medication Sig Dispense Refill   risedronate (ACTONEL) 35 MG tablet TAKE 1 TABLET BY MOUTH EVERY 7DAYS. WITH WATER ON EMPTY STOMACH, LIE DOWN FOR NEXT 30 MINUTES. 12 tablet 1   Vitamin D, Ergocalciferol, (DRISDOL) 1.25 MG (50000 UNIT) CAPS capsule Take 1 capsule (50,000 Units total) by mouth every 7 (seven) days. 12 capsule 1   warfarin (COUMADIN) 7.5 MG tablet TAKE 1/2 TO 1 TABLET BY MOUTH DAILY AS DIRECTED BY ANTICOAGULATION CLINIC. 90 tablet 1   amoxicillin (AMOXIL) 500 MG capsule Take 1 capsule by mouth. Prior to dental appointments. (Patient not taking: Reported on 10/05/2021)     No facility-administered medications prior to visit.    PAST MEDICAL HISTORY: Past Medical History:  Diagnosis Date   Anxiety    Aortic stenosis     by Dr. Prescott Gum   Colon polyp    Costochondritis    x2   Depression    Diverticulosis    GERD (gastroesophageal reflux disease)  Glaucoma (increased eye pressure)    HLD (hyperlipidemia)    IBS (irritable bowel syndrome)    Long term current use of anticoagulant    Migraine    OAB (overactive bladder)    Osteoporosis    S/P AVR    2001 for bicuspid AV.....Marland KitchenST JUDE   Shingles    ? diag/rash 11/21/12   Vulvar ulceration    recurrent    PAST SURGICAL HISTORY: Past Surgical History:  Procedure Laterality Date   AORTIC VALVE REPLACEMENT  05/18/1999   [facility]   APPENDECTOMY     post op abdominal wall hematoma/2 transfusions    BREAST BIOPSY Left 09/15/2006   left breast   CESAREAN SECTION     x 2   FINGER FRACTURE SURGERY Left    pinky    FAMILY HISTORY: Family History  Problem Relation Age of Onset   Hypertension Mother    Diabetes Mother    Hyperlipidemia Mother    Irritable bowel syndrome Mother    Diabetes Brother    Hyperlipidemia Brother    Hypertension Brother    Heart disease Maternal Grandmother    Stroke Maternal Grandmother    Alzheimer's disease Maternal Grandfather    Diabetes Maternal Grandfather    Diabetes Son    Pancreatic cancer Maternal Aunt    Colon cancer Maternal Great-grandmother    Kidney disease Maternal Uncle     SOCIAL HISTORY: Social History   Socioeconomic History   Marital status: Single    Spouse name: Not on file   Number of children: 2   Years of education: Not on file   Highest education level: Not on file  Occupational History   Occupation: benefits specialist    Employer: [employer]  Tobacco Use   Smoking status: Never   Smokeless tobacco: Never  Vaping Use   Vaping Use: Never used  Substance and Sexual Activity   Alcohol use: Yes    Alcohol/week: 1.0 - 2.0 standard drink    Types: 1 - 2 Standard drinks or equivalent per week    Comment: 1-2 per day   Drug use: No   Sexual activity: Yes    Partners: Male    Birth control/protection: Post-menopausal  Other Topics Concern   Not on file  Social History Narrative   Not on file   Social Determinants of Health   Financial Resource Strain: Not on file  Food Insecurity: Not on file  Transportation Needs: Not on file  Physical Activity: Not on file  Stress: Not on file  Social Connections: Not on file  Intimate Partner Violence: Not on file    PHYSICAL EXAM  GENERAL EXAM/CONSTITUTIONAL: Vitals:  Vitals:   10/05/21 1308  BP: (!) 153/75  Pulse: 67  Weight: 135 lb 6.4 oz (61.4 kg)  Height: '5\' 1"'$  (1.549 m)   Body mass index is 25.58 kg/m. Wt Readings from Last 3 Encounters:   10/05/21 135 lb 6.4 oz (61.4 kg)  07/23/21 131 lb 6.4 oz (59.6 kg)  03/27/21 135 lb (61.2 kg)   Patient is in no distress; well developed, nourished and groomed; neck is supple  EYES: Pupils round and reactive to light, Visual fields full to confrontation, Extraocular movements intacts,  No results found.  MUSCULOSKELETAL: Gait, strength, tone, movements noted in Neurologic exam below  NEUROLOGIC: MENTAL STATUS:      View : No data to display.         awake, alert, oriented to person, place and time recent  and remote memory intact normal attention and concentration language fluent, comprehension intact, naming intact fund of knowledge appropriate  CRANIAL NERVE:  2nd, 3rd, 4th, 6th - pupils equal and reactive to light, visual fields full to confrontation, extraocular muscles intact, no nystagmus 5th - facial sensation symmetric 7th - facial strength symmetric 8th - hearing intact 9th - palate elevates symmetrically, uvula midline 11th - shoulder shrug symmetric 12th - tongue protrusion midline  MOTOR:  normal bulk and tone, full strength in the BUE, BLE  SENSORY:  normal and symmetric to light touch, pinprick, temperature, vibration  COORDINATION:  finger-nose-finger, fine finger movements normal  REFLEXES:  deep tendon reflexes present and symmetric  GAIT/STATION:  normal   DIAGNOSTIC DATA (LABS, IMAGING, TESTING) - I reviewed patient records, labs, notes, testing and imaging myself where available.  Lab Results  Component Value Date   WBC 5.2 07/23/2021   HGB 13.3 07/23/2021   HCT 39.3 07/23/2021   MCV 91.7 07/23/2021   PLT 189.0 07/23/2021      Component Value Date/Time   NA 139 07/23/2021 1537   NA 139 02/09/2021 0855   K 3.7 07/23/2021 1537   CL 101 07/23/2021 1537   CO2 31 07/23/2021 1537   GLUCOSE 119 (H) 07/23/2021 1537   GLUCOSE 86 04/11/2006 1454   BUN 11 07/23/2021 1537   BUN 11 02/09/2021 0855   CREATININE 0.73 07/23/2021 1537    CREATININE 0.97 11/27/2016 1134   CALCIUM 9.4 07/23/2021 1537   PROT 7.1 07/23/2021 1537   PROT 7.2 02/09/2021 0855   ALBUMIN 4.6 07/23/2021 1537   ALBUMIN 4.7 02/09/2021 0855   AST 20 07/23/2021 1537   ALT 17 07/23/2021 1537   ALKPHOS 93 07/23/2021 1537   BILITOT 1.3 (H) 07/23/2021 1537   BILITOT 0.8 02/09/2021 0855   GFRNONAA 95 04/03/2019 1418   GFRNONAA 66 11/27/2016 1134   GFRAA 109 04/03/2019 1418   GFRAA 77 11/27/2016 1134   Lab Results  Component Value Date   CHOL 221 (H) 07/23/2021   HDL 69.30 07/23/2021   LDLCALC 131 (H) 07/23/2021   TRIG 106.0 07/23/2021   Lab Results  Component Value Date   HGBA1C 5.2 06/28/2013   Lab Results  Component Value Date   VITAMINB12 271 07/23/2021   Lab Results  Component Value Date   TSH 2.69 07/23/2021    Head CT 08/23/21 Normal unenhanced CT scan of the head   ASSESSMENT AND PLAN  61 y.o. year old female  with history of aortic valve replacement, osteoporosis, vitamin D deficiency and vitamin B12 deficiency who is presenting with 2 events concerning for seizure.  Both events are associated with urinary incontinence, postictal confusion and 1 event was witnessed by her mother who describes some generalized shaking.  I informed patient that I am concerned that these events are seizures, that I would like to start her on levetiracetam.  Discussed side effect of medication, and she is agreeable to start levetiracetam 500 mg twice daily.  I will also obtain routine EEG.  I will see the patient in 3 months for follow-up.  Advised her to contact me if she has a breakthrough seizure.   1. Seizure disorder ([place])   2. Vitamin D deficiency   3. Vitamin B12 deficiency     Patient Instructions  Start levetiracetam 500 mg twice daily Routine EEG Continue other medications Follow-up in 47-month or sooner if worse.   [facility], patients with seizures are not allowed to drive  until they have been seizure-free for  six months.  Other recommendations include using caution when using heavy equipment or power tools. Avoid working on ladders or at heights. Take showers instead of baths.  Do not swim alone.  Ensure the water temperature is not too high on the home water heater. Do not go swimming alone. Do not lock yourself in a room alone (i.e. bathroom). When caring for infants or small children, sit down when holding, feeding, or changing them to minimize risk of injury to the child in the event you have a seizure. Maintain good sleep hygiene. Avoid alcohol.  Also recommend adequate sleep, hydration, good diet and minimize stress.   During the Seizure  - First, ensure adequate ventilation and place patients on the floor on their left side  Loosen clothing around the neck and ensure the airway is patent. If the patient is clenching the teeth, do not force the mouth open with any object as this can cause severe damage - Remove all items from the surrounding that can be hazardous. The patient may be oblivious to what's happening and may not even know what he or she is doing. If the patient is confused and wandering, either gently guide him/her away and block access to outside areas - Reassure the individual and be comforting - Call 911. In most cases, the seizure ends before EMS arrives. However, there are cases when seizures may last over 3 to 5 minutes. Or the individual may have developed breathing difficulties or severe injuries. If a pregnant patient or a person with diabetes develops a seizure, it is prudent to call an ambulance. - Finally, if the patient does not regain full consciousness, then call EMS. Most patients will remain confused for about 45 to 90 minutes after a seizure, so you must use judgment in calling for help. - Avoid restraints but make sure the patient is in a bed with padded side rails - Place the individual in a lateral position with the neck slightly flexed; this will help the saliva drain  from the mouth and prevent the tongue from falling backward - Remove all nearby furniture and other hazards from the area - Provide verbal assurance as the individual is regaining consciousness - Provide the patient with privacy if possible - Call for help and start treatment as ordered by the caregiver   After the Seizure (Postictal Stage)  After a seizure, most patients experience confusion, fatigue, muscle pain and/or a headache. Thus, one should permit the individual to sleep. For the next few days, reassurance is essential. Being calm and helping reorient the person is also of importance.  Most seizures are painless and end spontaneously. Seizures are not harmful to others but can lead to complications such as stress on the lungs, brain and the heart. Individuals with prior lung problems may develop labored breathing and respiratory distress.     Orders Placed This Encounter  Procedures   EEG adult    Meds ordered this encounter  Medications   levETIRAcetam (KEPPRA) 500 MG tablet    Sig: Take 1 tablet (500 mg total) by mouth 2 (two) times daily.    Dispense:  60 tablet    Refill:  11    Return in about 3 months (around 01/05/2022).  I have spent a total of 55 minutes dedicated to this patient today, preparing to see patient, performing a medically appropriate examination and evaluation, ordering tests and/or medications and procedures, and counseling and educating the patient/family/caregiver; independently interpreting  result and communicating results to the family/patient/caregiver; and documenting clinical information in the electronic medical record.   Alric Ran, MD 10/05/2021, 4:31 PM  [facility] [address] [place], [place] [address] [phone]

## 2021-10-05 NOTE — Patient Instructions (Signed)
Start levetiracetam 500 mg twice daily Routine EEG Continue other medications Follow-up in 90-month or sooner if worse.

## 2021-10-14 ENCOUNTER — Ambulatory Visit (INDEPENDENT_AMBULATORY_CARE_PROVIDER_SITE_OTHER): Payer: No Typology Code available for payment source | Admitting: *Deleted

## 2021-10-14 ENCOUNTER — Ambulatory Visit (INDEPENDENT_AMBULATORY_CARE_PROVIDER_SITE_OTHER): Payer: No Typology Code available for payment source | Admitting: Neurology

## 2021-10-14 DIAGNOSIS — Z5181 Encounter for therapeutic drug level monitoring: Secondary | ICD-10-CM

## 2021-10-14 DIAGNOSIS — G40909 Epilepsy, unspecified, not intractable, without status epilepticus: Secondary | ICD-10-CM | POA: Diagnosis not present

## 2021-10-14 DIAGNOSIS — Z7901 Long term (current) use of anticoagulants: Secondary | ICD-10-CM

## 2021-10-14 LAB — POCT INR: INR: 2.5 (ref 2.0–3.0)

## 2021-10-14 NOTE — Patient Instructions (Signed)
Description   Continue taking 1 tablet daily except 1/2 tablet on Wednesdays. Recheck INR in 4 weeks. Coumadin clinic [phone].

## 2021-10-19 ENCOUNTER — Ambulatory Visit: Payer: No Typology Code available for payment source | Admitting: Neurology

## 2021-10-19 NOTE — Procedures (Signed)
    History:  61 year old woman with seizure   EEG classification: Awake and drowsy  Description of the recording: The background rhythms of this recording consists of a fairly well modulated medium amplitude alpha rhythm of 11 Hz that is reactive to eye opening and closure. As the record progresses, the patient appears to remain in the waking state throughout the recording. Photic stimulation was performed, did not show any abnormalities. Hyperventilation was also performed, did not show any abnormalities. Toward the end of the recording, the patient enters the drowsy state with slight symmetric slowing seen. The patient never enters stage II sleep. No abnormal epileptiform discharges seen during this recording. There was no focal slowing. EKG monitor shows no evidence of cardiac rhythm abnormalities with a heart rate of 72.  Abnormality: None   Impression: This is a normal EEG recording in the waking and drowsy state. No evidence of interictal epileptiform discharges seen. A normal EEG does not exclude a diagnosis of epilepsy.    Alric Ran, MD [facility]

## 2021-10-20 ENCOUNTER — Encounter: Payer: Self-pay | Admitting: Neurology

## 2021-11-11 ENCOUNTER — Ambulatory Visit (INDEPENDENT_AMBULATORY_CARE_PROVIDER_SITE_OTHER): Payer: No Typology Code available for payment source | Admitting: *Deleted

## 2021-11-11 DIAGNOSIS — Z7901 Long term (current) use of anticoagulants: Secondary | ICD-10-CM | POA: Diagnosis not present

## 2021-11-11 DIAGNOSIS — Z952 Presence of prosthetic heart valve: Secondary | ICD-10-CM

## 2021-11-11 LAB — POCT INR: INR: 3.2 — AB (ref 2.0–3.0)

## 2021-11-11 NOTE — Patient Instructions (Signed)
Description   Continue taking 1 tablet daily except 1/2 tablet on Wednesdays. Recheck INR in 6 weeks. Coumadin clinic [phone].

## 2021-12-22 ENCOUNTER — Ambulatory Visit (INDEPENDENT_AMBULATORY_CARE_PROVIDER_SITE_OTHER): Payer: No Typology Code available for payment source | Admitting: *Deleted

## 2021-12-22 DIAGNOSIS — Z7901 Long term (current) use of anticoagulants: Secondary | ICD-10-CM

## 2021-12-22 DIAGNOSIS — Z952 Presence of prosthetic heart valve: Secondary | ICD-10-CM | POA: Diagnosis not present

## 2021-12-22 LAB — POCT INR: INR: 2.6 (ref 2.0–3.0)

## 2021-12-22 NOTE — Patient Instructions (Signed)
Description   Today take 1.5 tablets then continue taking 1 tablet daily except 1/2 tablet on Wednesdays. Recheck INR in 6 weeks. Coumadin clinic [phone].

## 2022-01-07 ENCOUNTER — Ambulatory Visit: Payer: No Typology Code available for payment source | Admitting: Neurology

## 2022-01-07 ENCOUNTER — Other Ambulatory Visit: Payer: Self-pay | Admitting: Family Medicine

## 2022-01-12 ENCOUNTER — Other Ambulatory Visit: Payer: Self-pay | Admitting: Obstetrics & Gynecology

## 2022-01-12 DIAGNOSIS — M810 Age-related osteoporosis without current pathological fracture: Secondary | ICD-10-CM

## 2022-01-12 NOTE — Telephone Encounter (Signed)
LMOVM for pt to call office 

## 2022-01-26 ENCOUNTER — Encounter: Payer: Self-pay | Admitting: Family Medicine

## 2022-01-26 ENCOUNTER — Ambulatory Visit (INDEPENDENT_AMBULATORY_CARE_PROVIDER_SITE_OTHER): Payer: No Typology Code available for payment source | Admitting: Family Medicine

## 2022-01-26 VITALS — BP 120/76 | HR 72 | Temp 98.6°F | Resp 18 | Ht 61.0 in | Wt 140.4 lb

## 2022-01-26 DIAGNOSIS — Z0000 Encounter for general adult medical examination without abnormal findings: Secondary | ICD-10-CM | POA: Diagnosis not present

## 2022-01-26 DIAGNOSIS — E559 Vitamin D deficiency, unspecified: Secondary | ICD-10-CM | POA: Diagnosis not present

## 2022-01-26 DIAGNOSIS — R569 Unspecified convulsions: Secondary | ICD-10-CM

## 2022-01-26 DIAGNOSIS — Z23 Encounter for immunization: Secondary | ICD-10-CM | POA: Diagnosis not present

## 2022-01-26 DIAGNOSIS — Z952 Presence of prosthetic heart valve: Secondary | ICD-10-CM

## 2022-01-26 DIAGNOSIS — E538 Deficiency of other specified B group vitamins: Secondary | ICD-10-CM | POA: Diagnosis not present

## 2022-01-26 LAB — VITAMIN D 25 HYDROXY (VIT D DEFICIENCY, FRACTURES): VITD: 75.64 ng/mL (ref 30.00–100.00)

## 2022-01-26 LAB — COMPREHENSIVE METABOLIC PANEL
ALT: 14 U/L (ref 0–35)
AST: 18 U/L (ref 0–37)
Albumin: 4.4 g/dL (ref 3.5–5.2)
Alkaline Phosphatase: 75 U/L (ref 39–117)
BUN: 13 mg/dL (ref 6–23)
CO2: 31 mEq/L (ref 19–32)
Calcium: 9.5 mg/dL (ref 8.4–10.5)
Chloride: 101 mEq/L (ref 96–112)
Creatinine, Ser: 0.77 mg/dL (ref 0.40–1.20)
GFR: 84.14 mL/min (ref >60.00)
Glucose, Bld: 88 mg/dL (ref 70–99)
Potassium: 3.8 mEq/L (ref 3.5–5.1)
Sodium: 140 mEq/L (ref 135–145)
Total Bilirubin: 1.3 mg/dL — ABNORMAL HIGH (ref 0.2–1.2)
Total Protein: 7.4 g/dL (ref 6.0–8.3)

## 2022-01-26 LAB — CBC WITH DIFFERENTIAL/PLATELET
Basophils Absolute: 0 10*3/uL (ref 0.0–0.1)
Basophils Relative: 0.6 % (ref 0.0–3.0)
Eosinophils Absolute: 0.1 10*3/uL (ref 0.0–0.7)
Eosinophils Relative: 1.5 % (ref 0.0–5.0)
HCT: 39.8 % (ref 36.0–46.0)
Hemoglobin: 13.5 g/dL (ref 12.0–15.0)
Lymphocytes Relative: 29.6 % (ref 12.0–46.0)
Lymphs Abs: 1.4 10*3/uL (ref 0.7–4.0)
MCHC: 33.9 g/dL (ref 30.0–36.0)
MCV: 92.5 fl (ref 78.0–100.0)
Monocytes Absolute: 0.4 10*3/uL (ref 0.1–1.0)
Monocytes Relative: 9 % (ref 3.0–12.0)
Neutro Abs: 2.8 10*3/uL (ref 1.4–7.7)
Neutrophils Relative %: 59.3 % (ref 43.0–77.0)
Platelets: 177 10*3/uL (ref 150.0–400.0)
RBC: 4.3 Mil/uL (ref 3.87–5.11)
RDW: 13.5 % (ref 11.5–15.5)
WBC: 4.7 10*3/uL (ref 4.0–10.5)

## 2022-01-26 LAB — LIPID PANEL
Cholesterol: 228 mg/dL — ABNORMAL HIGH (ref 0–200)
HDL: 78.8 mg/dL (ref >39.00)
LDL Cholesterol: 129 mg/dL — ABNORMAL HIGH (ref 0–99)
NonHDL: 149.5
Total CHOL/HDL Ratio: 3
Triglycerides: 101 mg/dL (ref 0.0–149.0)
VLDL: 20.2 mg/dL (ref 0.0–40.0)

## 2022-01-26 LAB — TSH: TSH: 3.31 u[IU]/mL (ref 0.35–5.50)

## 2022-01-26 LAB — VITAMIN B12: Vitamin B-12: >1500 pg/mL — ABNORMAL HIGH (ref 211–911)

## 2022-01-26 NOTE — Progress Notes (Signed)
Subjective:   By signing my name below, I, [name], attest that this documentation has been prepared under the direction and in the presence of [name], DO  01/26/2022    Patient ID: Vanessa Owens, female    DOB: 1961/04/02, 61 y.o.   MRN: [id number]  Chief Complaint  Patient presents with   Annual Exam    Pt states not fasting     HPI Patient is in today for a comprehensive physical exam.   She continues following up with her cardiologist and GYN specialist regularly.  She continues taking 500 mg keppra 2x daily PO. She reports in her prior episodes of seizures she had no convulsions. She is requesting a referral to another neurologist for a second opinion on her diagnosis. She denies having any fever, new moles, congestion, sinus pain, sore throat, chest pain, palpitations, cough, shortness of breath, wheezing, nausea, vomiting, diarrhea, constipation, dysuria, frequency, abdominal pain, hematuria, new muscle pain, new joint pain, headaches. She has no changes to her family medical history. She is participating in exercise by walking over the weekend. She is planning on increasing her exercise. She is due for a tetanus vaccine and is interested in receiving it during this visit. She is interested in receiving the flu vaccine during this visit. She is eligible for the shingrix vaccines and is interested in receiving it.    Past Medical History:  Diagnosis Date   Anxiety    Aortic stenosis     by Dr. Prescott Gum   Colon polyp    Costochondritis    x2   Depression    Diverticulosis    GERD (gastroesophageal reflux disease)    Glaucoma (increased eye pressure)    HLD (hyperlipidemia)    IBS (irritable bowel syndrome)    Long term current use of anticoagulant    Migraine    OAB (overactive bladder)    Osteoporosis    S/P AVR    2001 for bicuspid AV.....Marland KitchenST JUDE   Shingles    ? diag/rash 11/21/12   Vulvar ulceration    recurrent    Past Surgical  History:  Procedure Laterality Date   AORTIC VALVE REPLACEMENT  05/18/1999   [facility]   APPENDECTOMY     post op abdominal wall hematoma/2 transfusions   BREAST BIOPSY Left 09/15/2006   left breast   CESAREAN SECTION     x 2   FINGER FRACTURE SURGERY Left    pinky    Family History  Problem Relation Age of Onset   Hypertension Mother    Diabetes Mother    Hyperlipidemia Mother    Irritable bowel syndrome Mother    Diabetes Brother    Hyperlipidemia Brother    Hypertension Brother    Heart disease Maternal Grandmother    Stroke Maternal Grandmother    Alzheimer's disease Maternal Grandfather    Diabetes Maternal Grandfather    Diabetes Son    Pancreatic cancer Maternal Aunt    Colon cancer Maternal Great-grandmother    Kidney disease Maternal Uncle     Social History   Socioeconomic History   Marital status: Single    Spouse name: Not on file   Number of children: 2   Years of education: Not on file   Highest education level: Not on file  Occupational History   Occupation: benefits specialist    Comment: [employer]  Tobacco Use   Smoking status: Never   Smokeless tobacco: Never  Vaping Use  Vaping Use: Never used  Substance and Sexual Activity   Alcohol use: Yes    Alcohol/week: 1.0 - 2.0 standard drink of alcohol    Types: 1 - 2 Standard drinks or equivalent per week    Comment: 1-2 per day   Drug use: No   Sexual activity: Yes    Partners: Male    Birth control/protection: Post-menopausal  Other Topics Concern   Not on file  Social History Narrative   Exercise--  walks on weekend , hikes    Social Determinants of Health   Financial Resource Strain: Not on file  Food Insecurity: Not on file  Transportation Needs: Not on file  Physical Activity: Not on file  Stress: Not on file  Social Connections: Not on file  Intimate Partner Violence: Not on file    Outpatient Medications Prior to Visit  Medication Sig Dispense Refill   levETIRAcetam (KEPPRA)  500 MG tablet Take 1 tablet (500 mg total) by mouth 2 (two) times daily. 60 tablet 11   risedronate (ACTONEL) 35 MG tablet TAKE 1 TABLET BY MOUTH EVERY 7DAYS. WITH WATER ON EMPTY STOMACH, LIE DOWN FOR NEXT 30 MINUTES. 12 tablet 0   Vitamin D, Ergocalciferol, (DRISDOL) 1.25 MG (50000 UNIT) CAPS capsule TAKE 1 CAPSULE (50,000 UNITS TOTAL) BY MOUTH EVERY 7 (SEVEN) DAYS 12 capsule 1   warfarin (COUMADIN) 7.5 MG tablet TAKE 1/2 TO 1 TABLET BY MOUTH DAILY AS DIRECTED BY ANTICOAGULATION CLINIC. 90 tablet 1   amoxicillin (AMOXIL) 500 MG capsule Take 1 capsule by mouth. Prior to dental appointments.     No facility-administered medications prior to visit.    Allergies  Allergen Reactions   Morphine And Related Nausea Only    Review of Systems  Constitutional:  Negative for fever and malaise/fatigue.  HENT:  Negative for congestion, sinus pain and sore throat.   Eyes:  Negative for blurred vision.  Respiratory:  Negative for cough, shortness of breath and wheezing.   Cardiovascular:  Negative for chest pain, palpitations and leg swelling.  Gastrointestinal:  Negative for abdominal pain, blood in stool, constipation, diarrhea, nausea and vomiting.  Genitourinary:  Negative for dysuria, frequency and hematuria.  Musculoskeletal:  Negative for falls.       (-)new muscle pain (-)new joint pain  Skin:  Negative for rash.       (-)New moles  Neurological:  Negative for dizziness, loss of consciousness and headaches.  Endo/Heme/Allergies:  Negative for environmental allergies.  Psychiatric/Behavioral:  Negative for depression. The patient is not nervous/anxious.        Objective:    Physical Exam Vitals and nursing note reviewed.  Constitutional:      General: She is not in acute distress.    Appearance: Normal appearance. She is well-developed. She is not ill-appearing or diaphoretic.  HENT:     Head: Normocephalic and atraumatic.     Right Ear: Tympanic membrane, ear canal and external ear  normal.     Left Ear: Tympanic membrane, ear canal and external ear normal.     Nose: Nose normal.  Eyes:     General:        Right eye: No discharge.        Left eye: No discharge.     Extraocular Movements: Extraocular movements intact.     Conjunctiva/sclera: Conjunctivae normal.     Pupils: Pupils are equal, round, and reactive to light.  Neck:     Thyroid: No thyromegaly.     Vascular: No  JVD.  Cardiovascular:     Rate and Rhythm: Normal rate and regular rhythm.     Heart sounds: No murmur heard.    No gallop.     Comments: + click , mech heart sounds  Pulmonary:     Effort: Pulmonary effort is normal. No respiratory distress.     Breath sounds: Normal breath sounds. No wheezing or rales.  Chest:     Chest wall: No tenderness.  Abdominal:     General: Bowel sounds are normal. There is no distension.     Palpations: Abdomen is soft. There is no mass.     Tenderness: There is no abdominal tenderness. There is no guarding or rebound.  Genitourinary:    Vagina: Normal. No vaginal discharge.     Rectum: Guaiac result negative.  Musculoskeletal:        General: No tenderness. Normal range of motion.     Cervical back: Normal range of motion and neck supple.  Lymphadenopathy:     Cervical: No cervical adenopathy.  Skin:    General: Skin is warm and dry.     Findings: No erythema or rash.  Neurological:     Mental Status: She is alert and oriented to person, place, and time.     Cranial Nerves: No cranial nerve deficit.     Deep Tendon Reflexes: Reflexes are normal and symmetric.  Psychiatric:        Behavior: Behavior normal.        Thought Content: Thought content normal.        Judgment: Judgment normal.     BP 120/76 (BP Location: Left Arm, Patient Position: Sitting, Cuff Size: Normal)   Pulse 72   Temp 98.6 F (37 C) (Oral)   Resp 18   Ht '5\' 1"'  (1.549 m)   Wt 140 lb 6.4 oz (63.7 kg)   LMP 05/18/2003   SpO2 97%   BMI 26.53 kg/m  Wt Readings from Last 3  Encounters:  01/26/22 140 lb 6.4 oz (63.7 kg)  10/05/21 135 lb 6.4 oz (61.4 kg)  07/23/21 131 lb 6.4 oz (59.6 kg)    Diabetic Foot Exam - Simple   No data filed    Lab Results  Component Value Date   WBC 4.7 01/26/2022   HGB 13.5 01/26/2022   HCT 39.8 01/26/2022   PLT 177.0 01/26/2022   GLUCOSE 88 01/26/2022   CHOL 228 (H) 01/26/2022   TRIG 101.0 01/26/2022   HDL 78.80 01/26/2022   LDLCALC 129 (H) 01/26/2022   ALT 14 01/26/2022   AST 18 01/26/2022   NA 140 01/26/2022   K 3.8 01/26/2022   CL 101 01/26/2022   CREATININE 0.77 01/26/2022   BUN 13 01/26/2022   CO2 31 01/26/2022   TSH 3.31 01/26/2022   INR 2.6 12/22/2021   HGBA1C 5.2 06/28/2013    Lab Results  Component Value Date   TSH 3.31 01/26/2022   Lab Results  Component Value Date   WBC 4.7 01/26/2022   HGB 13.5 01/26/2022   HCT 39.8 01/26/2022   MCV 92.5 01/26/2022   PLT 177.0 01/26/2022   Lab Results  Component Value Date   NA 140 01/26/2022   K 3.8 01/26/2022   CO2 31 01/26/2022   GLUCOSE 88 01/26/2022   BUN 13 01/26/2022   CREATININE 0.77 01/26/2022   BILITOT 1.3 (H) 01/26/2022   ALKPHOS 75 01/26/2022   AST 18 01/26/2022   ALT 14 01/26/2022   PROT 7.4 01/26/2022  ALBUMIN 4.4 01/26/2022   CALCIUM 9.5 01/26/2022   EGFR 85 02/09/2021   GFR 84.14 01/26/2022   Lab Results  Component Value Date   CHOL 228 (H) 01/26/2022   Lab Results  Component Value Date   HDL 78.80 01/26/2022   Lab Results  Component Value Date   LDLCALC 129 (H) 01/26/2022   Lab Results  Component Value Date   TRIG 101.0 01/26/2022   Lab Results  Component Value Date   CHOLHDL 3 01/26/2022   Lab Results  Component Value Date   HGBA1C 5.2 06/28/2013   Mammogram- Last completed 01/08/2021. Results are normal. Repeat in 1 year.  Dexa- Last completed 06/07/2019. Results showed she is osteoporotic. Repeat in 2 years.  Pap smear- Last completed 02/09/2021. Results are normal. Repeat in 3 years.  Colonoscopy- Last  completed 12/12/2020. Results showed: - Diverticulosis in the left colon and in the right colon. - One diminutive polyp in the proximal transverse colon, removed with a cold biopsy forceps. Resected and retrieved. - The examination was otherwise normal on direct and retroflexion views. Repeat      Assessment & Plan:   Problem List Items Addressed This Visit       Unprioritized   Seizures ([place])    Pt is requesting a second opinion from another neurologist       Relevant Orders   Ambulatory referral to Neurology   Preventative health care - Primary    ghm utd Check labs  See avs       Relevant Orders   CBC with Differential/Platelet (Completed)   Comprehensive metabolic panel (Completed)   Lipid panel (Completed)   TSH (Completed)   H/O aortic valve replacement    Stable  F/u cardiology      Other Visit Diagnoses     Vitamin D deficiency       Relevant Orders   VITAMIN D 25 Hydroxy (Vit-D Deficiency, Fractures) (Completed)   Vitamin B12 deficiency       Relevant Orders   Vitamin B12 (Completed)   Need for Tdap vaccination       Relevant Orders   Tdap vaccine greater than or equal to 7yo IM (Completed)   Need for influenza vaccination       Relevant Orders   Flu Vaccine QUAD 49moIM (Fluarix, Fluzone & Alfiuria Quad PF) (Completed)        No orders of the defined types were placed in this encounter.   I,[name] DO, personally preformed the services described in this documentation.  All medical record entries made by the scribe were at my direction and in my presence.  I have reviewed the chart and discharge instructions (if applicable) and agree that the record reflects my personal performance and is accurate and complete. 01/26/2022   I,[name],acting as a sEducation administratorfor [employer] DO.,have documented all relevant documentation on the behalf of YAnn Held DO,as directed by  YAnn Held DO while in the presence  of YAnn Held DO.   YAnn Held DO

## 2022-01-26 NOTE — Assessment & Plan Note (Signed)
Pt is requesting a second opinion from another neurologist

## 2022-01-26 NOTE — Patient Instructions (Signed)
Preventive Care 60-60 Years Old, Female Preventive care refers to lifestyle choices and visits with your health care provider that can promote health and wellness. Preventive care visits are also called wellness exams. What can I expect for my preventive care visit? Counseling Your health care provider may ask you questions about your: Medical history, including: Past medical problems. Family medical history. Pregnancy history. Current health, including: Menstrual cycle. Method of birth control. Emotional well-being. Home life and relationship well-being. Sexual activity and sexual health. Lifestyle, including: Alcohol, nicotine or tobacco, and drug use. Access to firearms. Diet, exercise, and sleep habits. Work and work environment. Sunscreen use. Safety issues such as seatbelt and bike helmet use. Physical exam Your health care provider will check your: Height and weight. These may be used to calculate your BMI (body mass index). BMI is a measurement that tells if you are at a healthy weight. Waist circumference. This measures the distance around your waistline. This measurement also tells if you are at a healthy weight and may help predict your risk of certain diseases, such as type 2 diabetes and high blood pressure. Heart rate and blood pressure. Body temperature. Skin for abnormal spots. What immunizations do I need?  Vaccines are usually given at various ages, according to a schedule. Your health care provider will recommend vaccines for you based on your age, medical history, and lifestyle or other factors, such as travel or where you work. What tests do I need? Screening Your health care provider may recommend screening tests for certain conditions. This may include: Lipid and cholesterol levels. Diabetes screening. This is done by checking your blood sugar (glucose) after you have not eaten for a while (fasting). Pelvic exam and Pap test. Hepatitis B test. Hepatitis C  test. HIV (human immunodeficiency virus) test. STI (sexually transmitted infection) testing, if you are at risk. Lung cancer screening. Colorectal cancer screening. Mammogram. Talk with your health care provider about when you should start having regular mammograms. This may depend on whether you have a family history of breast cancer. BRCA-related cancer screening. This may be done if you have a family history of breast, ovarian, tubal, or peritoneal cancers. Bone density scan. This is done to screen for osteoporosis. Talk with your health care provider about your test results, treatment options, and if necessary, the need for more tests. Follow these instructions at home: Eating and drinking  Eat a diet that includes fresh fruits and vegetables, whole grains, lean protein, and low-fat dairy products. Take vitamin and mineral supplements as recommended by your health care provider. Do not drink alcohol if: Your health care provider tells you not to drink. You are pregnant, may be pregnant, or are planning to become pregnant. If you drink alcohol: Limit how much you have to 0-1 drink a day. Know how much alcohol is in your drink. In the [place] one drink equals one 12 oz bottle of beer (355 mL), one 5 oz glass of wine (148 mL), or one 1 oz glass of hard liquor (44 mL). Lifestyle Brush your teeth every morning and night with fluoride toothpaste. Floss one time each day. Exercise for at least 30 minutes 5 or more days each week. Do not use any products that contain nicotine or tobacco. These products include cigarettes, chewing tobacco, and vaping devices, such as e-cigarettes. If you need help quitting, ask your health care provider. Do not use drugs. If you are sexually active, practice safe sex. Use a condom or other form of protection to   prevent STIs. If you do not wish to become pregnant, use a form of birth control. If you plan to become pregnant, see your health care provider for a  prepregnancy visit. Take aspirin only as told by your health care provider. Make sure that you understand how much to take and what form to take. Work with your health care provider to find out whether it is safe and beneficial for you to take aspirin daily. Find healthy ways to manage stress, such as: Meditation, yoga, or listening to music. Journaling. Talking to a trusted person. Spending time with friends and family. Minimize exposure to UV radiation to reduce your risk of skin cancer. Safety Always wear your seat belt while driving or riding in a vehicle. Do not drive: If you have been drinking alcohol. Do not ride with someone who has been drinking. When you are tired or distracted. While texting. If you have been using any mind-altering substances or drugs. Wear a helmet and other protective equipment during sports activities. If you have firearms in your house, make sure you follow all gun safety procedures. Seek help if you have been physically or sexually abused. What's next? Visit your health care provider once a year for an annual wellness visit. Ask your health care provider how often you should have your eyes and teeth checked. Stay up to date on all vaccines. This information is not intended to replace advice given to you by your health care provider. Make sure you discuss any questions you have with your health care provider. Document Revised: 10/29/2020 Document Reviewed: 10/29/2020 Elsevier Patient Education  [place].

## 2022-01-26 NOTE — Assessment & Plan Note (Signed)
ghm utd Check labs  See avs  

## 2022-01-26 NOTE — Assessment & Plan Note (Signed)
Stable  F/u cardiology

## 2022-02-02 ENCOUNTER — Ambulatory Visit: Payer: No Typology Code available for payment source

## 2022-02-08 ENCOUNTER — Ambulatory Visit: Payer: No Typology Code available for payment source | Attending: Cardiology | Admitting: *Deleted

## 2022-02-08 ENCOUNTER — Encounter: Payer: Self-pay | Admitting: Family Medicine

## 2022-02-08 DIAGNOSIS — Z7901 Long term (current) use of anticoagulants: Secondary | ICD-10-CM | POA: Diagnosis not present

## 2022-02-08 DIAGNOSIS — Z952 Presence of prosthetic heart valve: Secondary | ICD-10-CM | POA: Diagnosis not present

## 2022-02-08 LAB — POCT INR: INR: 2.2 (ref 2.0–3.0)

## 2022-02-08 NOTE — Patient Instructions (Signed)
Description   Today take 1.5 tablets then continue taking 1 tablet daily except 1/2 tablet on Wednesdays. Recheck INR in 5 weeks. Coumadin clinic [phone].

## 2022-02-10 ENCOUNTER — Other Ambulatory Visit: Payer: Self-pay | Admitting: Obstetrics & Gynecology

## 2022-02-10 DIAGNOSIS — Z1231 Encounter for screening mammogram for malignant neoplasm of breast: Secondary | ICD-10-CM

## 2022-02-20 ENCOUNTER — Other Ambulatory Visit: Payer: Self-pay | Admitting: Cardiovascular Disease

## 2022-03-01 ENCOUNTER — Ambulatory Visit: Payer: No Typology Code available for payment source | Admitting: Neurology

## 2022-03-11 ENCOUNTER — Ambulatory Visit
Admission: RE | Admit: 2022-03-11 | Discharge: 2022-03-11 | Disposition: A | Discharge status: Home / Self Care | Payer: No Typology Code available for payment source | Source: Ambulatory Visit

## 2022-03-11 DIAGNOSIS — Z1231 Encounter for screening mammogram for malignant neoplasm of breast: Secondary | ICD-10-CM

## 2022-03-15 ENCOUNTER — Ambulatory Visit: Payer: No Typology Code available for payment source | Attending: Cardiovascular Disease

## 2022-03-15 DIAGNOSIS — Z7901 Long term (current) use of anticoagulants: Secondary | ICD-10-CM | POA: Diagnosis not present

## 2022-03-15 DIAGNOSIS — Z952 Presence of prosthetic heart valve: Secondary | ICD-10-CM | POA: Diagnosis not present

## 2022-03-15 LAB — POCT INR: INR: 3.1 — AB (ref 2.0–3.0)

## 2022-03-15 NOTE — Patient Instructions (Signed)
Description   Continue taking 1 tablet daily except 1/2 tablet on Wednesdays. Recheck INR in 6 weeks. Coumadin clinic [phone].

## 2022-03-17 ENCOUNTER — Ambulatory Visit (HOSPITAL_BASED_OUTPATIENT_CLINIC_OR_DEPARTMENT_OTHER): Payer: No Typology Code available for payment source | Admitting: Obstetrics & Gynecology

## 2022-04-03 ENCOUNTER — Other Ambulatory Visit: Payer: Self-pay | Admitting: Obstetrics & Gynecology

## 2022-04-03 DIAGNOSIS — M810 Age-related osteoporosis without current pathological fracture: Secondary | ICD-10-CM

## 2022-04-06 NOTE — Progress Notes (Deleted)
CARDIOLOGY OFFICE NOTE  Date:  04/06/2022    Vanessa Owens Date of Birth: 10/17/1960 Medical Record #[id number]  PCP:  [name], DO  Cardiologist:  Johnsie Cancel   No chief complaint on file.    History of Present Illness: Vanessa Owens is a 61 y.o. 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 She has been on chronic coumadin. Echos done at [facility] due to cost. Prior normal GXT in 2018 for atypical chest pain.   TTE reviewed from 03/18/20 EF 60-65% mean gradient 26 mmHg up from 21 mmHg in 2019 peak gradient 40 mmHg DVI 0.42 and AVA 1.3 cm2 mild AR TTE 04/06/2022 mean gradient 11 peak 19 trivial AR   Her INR;s have been Rx with no bleeding issues   Working virtually for [employer] also working some at [employer].  She has a 19 mm HP St jude valve placed by DR PVT 08/10/1999 for bicuspid AV  No history of GI bleeding but has had polyps removed most recently by Dr Pincus Sanes on 12/12/20  She is doing intermittent fasting with good weight loss. Still working at [employer] part time and has 3 grand kids now   Cardiac CTA 08/05/21 reviewed Calcium score 4 only 14 th percentile for age/sex CAD RADS 2 no obstructive dx normal AVR OA/CA's   She is on Keppra but diagnosis of seizures somewhat vague  ***   Past Medical History:  Diagnosis Date   Anxiety    Aortic stenosis     by Dr. Prescott Gum   Colon polyp    Costochondritis    x2   Depression    Diverticulosis    GERD (gastroesophageal reflux disease)    Glaucoma (increased eye pressure)    HLD (hyperlipidemia)    IBS (irritable bowel syndrome)    Long term current use of anticoagulant    Migraine    OAB (overactive bladder)    Osteoporosis    S/P AVR    2001 for bicuspid AV.....Marland KitchenST JUDE   Shingles    ? diag/rash 11/21/12   Vulvar ulceration    recurrent    Past Surgical History:  Procedure Laterality Date   AORTIC VALVE REPLACEMENT  05/18/1999   St Jude   APPENDECTOMY     post op  abdominal wall hematoma/2 transfusions   BREAST BIOPSY Left 09/15/2006   left breast   CESAREAN SECTION     x 2   FINGER FRACTURE SURGERY Left    pinky     Medications: No outpatient medications have been marked as taking for the 04/16/22 encounter (Appointment) with Josue Hector, MD.     Allergies: Allergies  Allergen Reactions   Morphine And Related Nausea Only    Social History: The patient  reports that she has never smoked. She has never used smokeless tobacco. She reports current alcohol use of about 1.0 - 2.0 standard drink of alcohol per week. She reports that she does not use drugs.   Family History: The patient's family history includes Alzheimer's disease in her maternal grandfather; Colon cancer in her maternal great-grandmother; Diabetes in her brother, maternal grandfather, mother, and son; Heart disease in her maternal grandmother; Hyperlipidemia in her brother and mother; Hypertension in her brother and mother; Irritable bowel syndrome in her mother; Kidney disease in her maternal uncle; Pancreatic cancer in her maternal aunt; Stroke in her maternal grandmother.   Review of Systems: Please see the history of present illness.  All other systems are reviewed and negative.   Physical Exam: VS:  LMP 05/18/2003  .  BMI There is no height or weight on file to calculate BMI.  Wt Readings from Last 3 Encounters:  01/26/22 140 lb 6.4 oz (63.7 kg)  10/05/21 135 lb 6.4 oz (61.4 kg)  07/23/21 131 lb 6.4 oz (59.6 kg)    Affect appropriate Healthy:  appears stated age HEENT: normal Neck supple with no adenopathy JVP normal no bruits no thyromegaly Lungs clear with no wheezing and good diaphragmatic motion Heart:  V7/Q4 click SEM  no AR murmur, no rub, gallop or click PMI normal Abdomen: benighn, BS positve, no tenderness, no AAA no bruit.  No HSM or HJR Distal pulses intact with no bruits No edema Neuro non-focal Skin warm and dry No muscular  weakness    LABORATORY DATA:  EKG:  EKG is ordered today. 04/06/2022 SR rate 64 normal   Lab Results  Component Value Date   WBC 4.7 01/26/2022   HGB 13.5 01/26/2022   HCT 39.8 01/26/2022   PLT 177.0 01/26/2022   GLUCOSE 88 01/26/2022   CHOL 228 (H) 01/26/2022   TRIG 101.0 01/26/2022   HDL 78.80 01/26/2022   LDLCALC 129 (H) 01/26/2022   ALT 14 01/26/2022   AST 18 01/26/2022   NA 140 01/26/2022   K 3.8 01/26/2022   CL 101 01/26/2022   CREATININE 0.77 01/26/2022   BUN 13 01/26/2022   CO2 31 01/26/2022   TSH 3.31 01/26/2022   INR 3.1 (A) 03/15/2022   HGBA1C 5.2 06/28/2013   Lab Results  Component Value Date   INR 3.1 (A) 03/15/2022   INR 2.2 02/08/2022   INR 2.6 12/22/2021   PROTIME 21.8 10/17/2008   PROTIME 18.0 10/10/2008     BNP (last 3 results) No results for input(s): "BNP" in the last 8760 hours.  ProBNP (last 3 results) No results for input(s): "PROBNP" in the last 8760 hours.   Other Studies Reviewed Today:  Echo Study Conclusions 10/2017  - Left ventricle: The cavity size was normal. Systolic function was    normal. The estimated ejection fraction was in the range of 55%    to 60%. Wall motion was normal; there were no regional wall    motion abnormalities. Features are consistent with a pseudonormal    left ventricular filling pattern, with concomitant abnormal    relaxation and increased filling pressure (grade 2 diastolic    dysfunction).  - Aortic valve: A mechanical prosthesis was present and functioning    normally. Mean gradient (S): 21 mm Hg. Peak gradient (S): 38 mm    Hg. Valve area (VTI): 1.35 cm^2. Valve area (Vmax): 1.18 cm^2.    Valve area (Vmean): 1.24 cm^2.  - Mitral valve: There was mild regurgitation directed centrally.  - Left atrium: The atrium was mildly dilated.   Impressions:   - Mildly increased prosthetic valve gradients and mild dilation of    the ascending aorta are unchanged from the previous study.    Assessment/Plan:  AVR :  old valve surprisingly gradients now in normal range compared to prior normal valve clicks on exam and normal EF improved Cardiac CTA 08/05/21 no obstructive CAD and normal valve function   2. Anticoagulation - no problems noted. Has held for 3 days prior to surgery without incident in past   3.  GI:  f/u Dannis history of colon polyps most recently 12/12/20 polypectomy Benign pathology   4. Seizures:  on  Keppra getting 2nd neurology consult opinion   Current medicines are reviewed with the patient today.  The patient does not have concerns regarding medicines other than what has been noted above.  The following changes have been made:  See above.  Labs/ tests ordered today include: None   No orders of the defined types were placed in this encounter.   Disposition:   FU in a year   Signed: [name], MD  04/06/2022 1:09 PM  [facility] [address] [place], [place]  [address] Phone: [phone] Fax: [phone]

## 2022-04-16 ENCOUNTER — Ambulatory Visit: Payer: No Typology Code available for payment source | Admitting: Cardiovascular Disease

## 2022-04-26 ENCOUNTER — Ambulatory Visit: Payer: No Typology Code available for payment source | Attending: Cardiology | Admitting: *Deleted

## 2022-04-26 DIAGNOSIS — Z7901 Long term (current) use of anticoagulants: Secondary | ICD-10-CM | POA: Diagnosis not present

## 2022-04-26 DIAGNOSIS — Z952 Presence of prosthetic heart valve: Secondary | ICD-10-CM | POA: Diagnosis not present

## 2022-04-26 LAB — POCT INR: INR: 5 — AB (ref 2.0–3.0)

## 2022-04-26 NOTE — Patient Instructions (Signed)
Description   Do not take any warfarin today and no warfarin tomorrow then continue taking 1 tablet daily except 1/2 tablet on Wednesdays. Recheck INR in 2 (normally 6 weeks). Coumadin clinic [phone].

## 2022-05-02 NOTE — Progress Notes (Unsigned)
CARDIOLOGY OFFICE NOTE  Date:  05/03/2022    Vanessa Owens Date of Birth: [birth year] Medical Record #[id number]  PCP:  [name], DO  Cardiologist:  Johnsie Cancel   No chief complaint on file.    History of Present Illness: Vanessa Owens is a 61 y.o. 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 She has been on chronic coumadin. Echos done at [facility] due to cost. Prior normal GXT in 2018 for atypical chest pain.   TTE reviewed from 03/18/20 EF 60-65% mean gradient 26 mmHg up from 21 mmHg in 2019 peak gradient 40 mmHg DVI 0.42 and AVA 1.3 cm2 mild AR TTE 05/03/2022 mean gradient 11 peak 19 trivial AR   Her INR;s have been Rx with no bleeding issues   Working virtually for [employer] also working some at [employer].  She has a 19 mm HP St jude valve placed by DR PVT 08/10/1999 for bicuspid AV  No history of GI bleeding but has had polyps removed most recently by Dr Pincus Sanes on 12/12/20  She is doing intermittent fasting with good weight loss. Still working at [employer] part time and has 3 grand kids now   Cardiac CTA 08/05/21 reviewed Calcium score 4 only 44 th percentile for age/sex CAD RADS 2 no obstructive dx normal AVR OA/CA's    She is not on Keppra Has had episode in February and April ? Seizures with change in MS ? Also has  Neurologist ordering tilt table Not orthostatic in office today systolic BP 704 mmHg sitting/standing      Past Medical History:  Diagnosis Date   Anxiety    Aortic stenosis     by Dr. Prescott Gum   Colon polyp    Costochondritis    x2   Depression    Diverticulosis    GERD (gastroesophageal reflux disease)    Glaucoma (increased eye pressure)    HLD (hyperlipidemia)    IBS (irritable bowel syndrome)    Long term current use of anticoagulant    Migraine    OAB (overactive bladder)    Osteoporosis    S/P AVR    2001 for bicuspid AV.....Marland KitchenST JUDE   Shingles    ? diag/rash 11/21/12   Vulvar ulceration     recurrent    Past Surgical History:  Procedure Laterality Date   AORTIC VALVE REPLACEMENT  05/18/1999   St Jude   APPENDECTOMY     post op abdominal wall hematoma/2 transfusions   BREAST BIOPSY Left 09/15/2006   left breast   CESAREAN SECTION     x 2   FINGER FRACTURE SURGERY Left    pinky     Medications: Current Meds  Medication Sig   risedronate (ACTONEL) 35 MG tablet TAKE 1 TABLET BY MOUTH EVERY 7DAYS. WITH WATER ON EMPTY STOMACH, LIE DOWN FOR NEXT 30 MINUTES.   Vitamin D, Ergocalciferol, (DRISDOL) 1.25 MG (50000 UNIT) CAPS capsule TAKE 1 CAPSULE (50,000 UNITS TOTAL) BY MOUTH EVERY 7 (SEVEN) DAYS   warfarin (COUMADIN) 7.5 MG tablet TAKE 1/2 TO 1 TABLET BY MOUTH DAILY AS DIRECTED BY ANTICOAGULATION CLINIC.     Allergies: Allergies  Allergen Reactions   Morphine And Related Nausea Only    Social History: The patient  reports that she has never smoked. She has never used smokeless tobacco. She reports current alcohol use of about 1.0 - 2.0 standard drink of alcohol per week. She reports that she does not use drugs.  Family History: The patient's family history includes Alzheimer's disease in her maternal grandfather; Colon cancer in her maternal great-grandmother; Diabetes in her brother, maternal grandfather, mother, and son; Heart disease in her maternal grandmother; Hyperlipidemia in her brother and mother; Hypertension in her brother and mother; Irritable bowel syndrome in her mother; Kidney disease in her maternal uncle; Pancreatic cancer in her maternal aunt; Stroke in her maternal grandmother.   Review of Systems: Please see the history of present illness.   All other systems are reviewed and negative.   Physical Exam: VS:  BP 122/82   Pulse 77   Ht '5\' 1"'$  (1.549 m)   Wt 149 lb (67.6 kg)   LMP 05/18/2003   SpO2 97%   BMI 28.15 kg/m  .  BMI Body mass index is 28.15 kg/m.  Wt Readings from Last 3 Encounters:  05/03/22 149 lb (67.6 kg)  01/26/22 140 lb 6.4  oz (63.7 kg)  10/05/21 135 lb 6.4 oz (61.4 kg)    Affect appropriate Healthy:  appears stated age HEENT: normal Neck supple with no adenopathy JVP normal no bruits no thyromegaly Lungs clear with no wheezing and good diaphragmatic motion Heart:  W4/Y6 click SEM  no AR murmur, no rub, gallop or click PMI normal Abdomen: benighn, BS positve, no tenderness, no AAA no bruit.  No HSM or HJR Distal pulses intact with no bruits No edema Neuro non-focal Skin warm and dry No muscular weakness    LABORATORY DATA:  EKG:  EKG is ordered today. 05/03/2022 SR rate 64 normal   Lab Results  Component Value Date   WBC 4.7 01/26/2022   HGB 13.5 01/26/2022   HCT 39.8 01/26/2022   PLT 177.0 01/26/2022   GLUCOSE 88 01/26/2022   CHOL 228 (H) 01/26/2022   TRIG 101.0 01/26/2022   HDL 78.80 01/26/2022   LDLCALC 129 (H) 01/26/2022   ALT 14 01/26/2022   AST 18 01/26/2022   NA 140 01/26/2022   K 3.8 01/26/2022   CL 101 01/26/2022   CREATININE 0.77 01/26/2022   BUN 13 01/26/2022   CO2 31 01/26/2022   TSH 3.31 01/26/2022   INR 5.0 (A) 04/26/2022   HGBA1C 5.2 06/28/2013   Lab Results  Component Value Date   INR 5.0 (A) 04/26/2022   INR 3.1 (A) 03/15/2022   INR 2.2 02/08/2022   PROTIME 21.8 10/17/2008   PROTIME 18.0 10/10/2008     BNP (last 3 results) No results for input(s): "BNP" in the last 8760 hours.  ProBNP (last 3 results) No results for input(s): "PROBNP" in the last 8760 hours.   Other Studies Reviewed Today:  Echo Study Conclusions 10/2017  - Left ventricle: The cavity size was normal. Systolic function was    normal. The estimated ejection fraction was in the range of 55%    to 60%. Wall motion was normal; there were no regional wall    motion abnormalities. Features are consistent with a pseudonormal    left ventricular filling pattern, with concomitant abnormal    relaxation and increased filling pressure (grade 2 diastolic    dysfunction).  - Aortic valve: A  mechanical prosthesis was present and functioning    normally. Mean gradient (S): 21 mm Hg. Peak gradient (S): 38 mm    Hg. Valve area (VTI): 1.35 cm^2. Valve area (Vmax): 1.18 cm^2.    Valve area (Vmean): 1.24 cm^2.  - Mitral valve: There was mild regurgitation directed centrally.  - Left atrium: The atrium was mildly dilated.  Impressions:   - Mildly increased prosthetic valve gradients and mild dilation of    the ascending aorta are unchanged from the previous study.   Assessment/Plan:  AVR :  old valve surprisingly gradients now in normal range compared to prior normal valve clicks on exam and normal EF improved Cardiac CTA 08/05/21 no obstructive CAD and normal valve function   2. Anticoagulation - no problems noted. Has held for 3 days prior to surgery without incident in past   3.  GI:  f/u Dannis history of colon polyps most recently 12/12/20 polypectomy Benign pathology   4. Seizures:  off Keppra getting 2nd neurology consult opinion   Current medicines are reviewed with the patient today.  The patient does not have concerns regarding medicines other than what has been noted above.  The following changes have been made:  See above.  Labs/ tests ordered today include: None   No orders of the defined types were placed in this encounter.   Disposition:   FU in a year   Signed: [name], MD  05/03/2022 9:03 AM  [facility] [address] [place], [place]  [address] Phone: [phone] Fax: [phone]

## 2022-05-03 ENCOUNTER — Ambulatory Visit
Payer: No Typology Code available for payment source | Attending: Cardiovascular Disease | Admitting: Cardiovascular Disease

## 2022-05-03 ENCOUNTER — Encounter: Payer: Self-pay | Admitting: Cardiovascular Disease

## 2022-05-03 VITALS — BP 122/82 | HR 77 | Ht 61.0 in | Wt 149.0 lb

## 2022-05-03 DIAGNOSIS — Z7901 Long term (current) use of anticoagulants: Secondary | ICD-10-CM | POA: Diagnosis not present

## 2022-05-03 DIAGNOSIS — Z952 Presence of prosthetic heart valve: Secondary | ICD-10-CM | POA: Diagnosis not present

## 2022-05-03 DIAGNOSIS — R072 Precordial pain: Secondary | ICD-10-CM | POA: Diagnosis not present

## 2022-05-03 NOTE — Patient Instructions (Signed)
Medication Instructions:  Your physician recommends that you continue on your current medications as directed. Please refer to the Current Medication list given to you today.  *If you need a refill on your cardiac medications before your next appointment, please call your pharmacy*  Lab Work: If you have labs (blood work) drawn today and your tests are completely normal, you will receive your results only by: MyChart Message (if you have MyChart) OR A paper copy in the mail If you have any lab test that is abnormal or we need to change your treatment, we will call you to review the results.  Testing/Procedures: None ordered today.  Follow-Up: At [facility], you and your health needs are our priority.  As part of our continuing mission to provide you with exceptional heart care, we have created designated Provider Care Teams.  These Care Teams include your primary Cardiologist (physician) and Advanced Practice Providers (APPs -  Physician Assistants and Nurse Practitioners) who all work together to provide you with the care you need, when you need it.  We recommend signing up for the patient portal called "MyChart".  Sign up information is provided on this After Visit Summary.  MyChart is used to connect with patients for Virtual Visits (Telemedicine).  Patients are able to view lab/test results, encounter notes, upcoming appointments, etc.  Non-urgent messages can be sent to your provider as well.   To learn more about what you can do with MyChart, go to [email or web address].    Your next appointment:   1 year(s)  The format for your next appointment:   In Person  Provider:   Peter Nishan, MD     Important Information About Sugar       

## 2022-05-12 ENCOUNTER — Ambulatory Visit: Payer: No Typology Code available for payment source

## 2022-05-19 ENCOUNTER — Ambulatory Visit: Payer: No Typology Code available for payment source | Attending: Cardiovascular Disease

## 2022-05-19 DIAGNOSIS — Z952 Presence of prosthetic heart valve: Secondary | ICD-10-CM

## 2022-05-19 DIAGNOSIS — Z7901 Long term (current) use of anticoagulants: Secondary | ICD-10-CM

## 2022-05-19 LAB — POCT INR: INR: 4.1 — AB (ref 2.0–3.0)

## 2022-05-19 NOTE — Patient Instructions (Signed)
Description   Do not take any warfarin today and then START taking 1 tablet daily except 1/2 tablet on Mondays and Wednesdays.  Recheck INR in 2 (normally 6 weeks).  Coumadin clinic [phone].

## 2022-05-31 ENCOUNTER — Ambulatory Visit: Payer: No Typology Code available for payment source | Attending: Cardiology

## 2022-05-31 DIAGNOSIS — Z7901 Long term (current) use of anticoagulants: Secondary | ICD-10-CM | POA: Diagnosis not present

## 2022-05-31 LAB — POCT INR: INR: 3.6 — AB (ref 2.0–3.0)

## 2022-05-31 NOTE — Patient Instructions (Signed)
Description   Eat a serving of greens today and continue taking 1 tablet daily except 1/2 tablet on Mondays and Wednesdays.  Recheck INR in 3 weeks (normally 6 weeks).  Coumadin clinic [phone].

## 2022-06-11 ENCOUNTER — Encounter (HOSPITAL_BASED_OUTPATIENT_CLINIC_OR_DEPARTMENT_OTHER): Payer: Self-pay | Admitting: Obstetrics & Gynecology

## 2022-06-11 ENCOUNTER — Ambulatory Visit (HOSPITAL_BASED_OUTPATIENT_CLINIC_OR_DEPARTMENT_OTHER): Payer: No Typology Code available for payment source | Admitting: Obstetrics & Gynecology

## 2022-06-11 VITALS — BP 133/51 | HR 64 | Ht 61.0 in | Wt 146.8 lb

## 2022-06-11 DIAGNOSIS — Z7901 Long term (current) use of anticoagulants: Secondary | ICD-10-CM | POA: Diagnosis not present

## 2022-06-11 DIAGNOSIS — Z780 Asymptomatic menopausal state: Secondary | ICD-10-CM

## 2022-06-11 DIAGNOSIS — Z860100 Personal history of colon polyps, unspecified: Secondary | ICD-10-CM

## 2022-06-11 DIAGNOSIS — M816 Localized osteoporosis [Lequesne]: Secondary | ICD-10-CM | POA: Diagnosis not present

## 2022-06-11 DIAGNOSIS — Z01419 Encounter for gynecological examination (general) (routine) without abnormal findings: Secondary | ICD-10-CM

## 2022-06-11 MED ORDER — RISEDRONATE SODIUM 35 MG PO TABS: 35.0000 mg | ORAL_TABLET | ORAL | 3 refills | Status: DC

## 2022-06-11 NOTE — Progress Notes (Signed)
62 y.o. [place] or Caucasian female here for annual exam.  Had two possible seizures since I saw her last--Feb and April.  She has seen two neurologists.  EEG was normal.  Keppra was prescribed.  Advised not to drive.  She sought a second opinion.  Pt felt intermittent fasting was part of the issues.  She has seen Dr. Carollee Herter and blood work was done.  Vit D was low.  Lab work follow up has been normal.  Doing better with water.  Hasn't had any issues since April.  Has seen cardiology as well.  On Alendronate.  H/o osteoporosis.    Patient's last menstrual period was 05/18/2003.          Sexually active: Yes.    The current method of family planning is post menopausal status.    Exercising: Yes.    Work required lots of walking.  Gets over 10,000 steps a day. Smoker:  no  Health Maintenance: Pap:  02/09/2021 Negative History of abnormal Pap:  remote hx MMG:  03/11/2022 Negative Colonoscopy:  12/12/2020, follow up 7 years BMD:   06/07/2019 Osteoporosis in spine Screening Labs: 01/2022   reports that she has never smoked. She has never used smokeless tobacco. She reports current alcohol use of about 1.0 - 2.0 standard drink of alcohol per week. She reports that she does not use drugs.  Past Medical History:  Diagnosis Date   Anxiety    Aortic stenosis     by Dr. Prescott Gum   Colon polyp    Costochondritis    x2   Depression    Diverticulosis    GERD (gastroesophageal reflux disease)    Glaucoma (increased eye pressure)    HLD (hyperlipidemia)    IBS (irritable bowel syndrome)    Long term current use of anticoagulant    Migraine    OAB (overactive bladder)    Osteoporosis    S/P AVR    2001 for bicuspid AV.....Marland KitchenST JUDE   Shingles    ? diag/rash 11/21/12   Vulvar ulceration    recurrent    Past Surgical History:  Procedure Laterality Date   AORTIC VALVE REPLACEMENT  05/18/1999   St Jude   APPENDECTOMY     post op abdominal wall hematoma/2 transfusions    BREAST BIOPSY Left 09/15/2006   left breast   CESAREAN SECTION     x 2   FINGER FRACTURE SURGERY Left    pinky    Current Outpatient Medications  Medication Sig Dispense Refill   Vitamin D, Ergocalciferol, (DRISDOL) 1.25 MG (50000 UNIT) CAPS capsule TAKE 1 CAPSULE (50,000 UNITS TOTAL) BY MOUTH EVERY 7 (SEVEN) DAYS 12 capsule 1   warfarin (COUMADIN) 7.5 MG tablet TAKE 1/2 TO 1 TABLET BY MOUTH DAILY AS DIRECTED BY ANTICOAGULATION CLINIC. 90 tablet 1   risedronate (ACTONEL) 35 MG tablet Take 1 tablet (35 mg total) by mouth every 7 (seven) days. with water on empty stomach, nothing by mouth or lie down for next 30 minutes. 12 tablet 3   No current facility-administered medications for this visit.    Family History  Problem Relation Age of Onset   Hypertension Mother    Diabetes Mother    Hyperlipidemia Mother    Irritable bowel syndrome Mother    Diabetes Brother    Hyperlipidemia Brother    Hypertension Brother    Heart disease Maternal Grandmother    Stroke Maternal Grandmother    Alzheimer's disease Maternal Grandfather  Diabetes Maternal Grandfather    Diabetes Son    Pancreatic cancer Maternal Aunt    Colon cancer Maternal Great-grandmother    Kidney disease Maternal Uncle     ROS: Constitutional: negative Genitourinary:negative  Exam:   BP (!) 133/51 (BP Location: Right Arm, Patient Position: Sitting, Cuff Size: Large)   Pulse 64   Ht '5\' 1"'$  (1.549 m) Comment: Reported  Wt 146 lb 12.8 oz (66.6 kg)   LMP 05/18/2003   BMI 27.74 kg/m   Height: '5\' 1"'$  (154.9 cm) (Reported)  General appearance: alert, cooperative and appears stated age Head: Normocephalic, without obvious abnormality, atraumatic Neck: no adenopathy, supple, symmetrical, trachea midline and thyroid normal to inspection and palpation Lungs: clear to auscultation bilaterally Breasts: normal appearance, no masses or tenderness Heart: regular rate and rhythm Abdomen: soft, non-tender; bowel sounds  normal; no masses,  no organomegaly Extremities: extremities normal, atraumatic, no cyanosis or edema Skin: Skin color, texture, turgor normal. No rashes or lesions Lymph nodes: Cervical, supraclavicular, and axillary nodes normal. No abnormal inguinal nodes palpated Neurologic: Grossly normal   Pelvic: External genitalia:  no lesions              Urethra:  normal appearing urethra with no masses, tenderness or lesions              Bartholins and Skenes: normal                 Vagina: normal appearing vagina with normal color and no discharge, no lesions              Cervix: no lesions              Pap taken: No. Bimanual Exam:  Uterus:  normal size, contour, position, consistency, mobility, non-tender              Adnexa: normal adnexa and no mass, fullness, tenderness               Rectovaginal: Confirms               Anus:  normal sphincter tone, no lesions  Chaperone, Octaviano Batty, CMA, was present for exam.  Assessment/Plan: 1. Well woman exam with routine gynecological exam - Pap smear 01/2021 - Mammogram 02/2022 - Colonoscopy 2022.  Follow up 7 years - Bone mineral density ordered - lab work done with PCP, Dr. Carollee Herter - vaccines reviewed/updated  2. Localized osteoporosis without current pathological fracture - DG BONE DENSITY (DXA); Future - risedronate (ACTONEL) 35 MG tablet; Take 1 tablet (35 mg total) by mouth every 7 (seven) days. with water on empty stomach, nothing by mouth or lie down for next 30 minutes.  Dispense: 12 tablet; Refill: 3  3. Long term current use of anticoagulant  4. Postmenopausal - no HRT  5. Hx of colonic polyps

## 2022-06-14 ENCOUNTER — Ambulatory Visit: Payer: No Typology Code available for payment source | Admitting: Cardiovascular Disease

## 2022-06-21 ENCOUNTER — Other Ambulatory Visit: Payer: Self-pay | Admitting: Family Medicine

## 2022-06-24 ENCOUNTER — Ambulatory Visit: Payer: No Typology Code available for payment source | Attending: Cardiovascular Disease

## 2022-06-24 DIAGNOSIS — Z7901 Long term (current) use of anticoagulants: Secondary | ICD-10-CM

## 2022-06-24 DIAGNOSIS — Z952 Presence of prosthetic heart valve: Secondary | ICD-10-CM | POA: Diagnosis not present

## 2022-06-24 LAB — POCT INR: INR: 2.3 (ref 2.0–3.0)

## 2022-06-24 NOTE — Patient Instructions (Addendum)
Description   Take 1.5 tablets today and then continue taking 1 tablet daily except 1/2 tablet on Mondays and Wednesdays.  Recheck INR in 3 weeks (normally 6 weeks).  Coumadin clinic [phone].

## 2022-07-02 ENCOUNTER — Telehealth: Payer: Self-pay | Admitting: *Deleted

## 2022-07-02 NOTE — Telephone Encounter (Signed)
Pt called to states she had a seizure and was started on Keppra, Muscle Relaxer, Hydrocodone, and Nasal Spray on 06/27/22. Also she states she fractured her vertebra. Advised these are safe to take with warfarin and to call back if needed.

## 2022-07-19 ENCOUNTER — Encounter (HOSPITAL_BASED_OUTPATIENT_CLINIC_OR_DEPARTMENT_OTHER): Payer: Self-pay | Admitting: Obstetrics & Gynecology

## 2022-07-19 ENCOUNTER — Encounter: Payer: Self-pay | Admitting: Cardiovascular Disease

## 2022-07-20 ENCOUNTER — Ambulatory Visit: Payer: No Typology Code available for payment source | Attending: Cardiology | Admitting: *Deleted

## 2022-07-20 DIAGNOSIS — Z952 Presence of prosthetic heart valve: Secondary | ICD-10-CM | POA: Diagnosis not present

## 2022-07-20 DIAGNOSIS — Z7901 Long term (current) use of anticoagulants: Secondary | ICD-10-CM | POA: Diagnosis not present

## 2022-07-20 LAB — POCT INR: INR: 3.2 — AB (ref 2.0–3.0)

## 2022-07-20 NOTE — Patient Instructions (Signed)
Description   Continue taking 1 tablet daily except 1/2 tablet on Mondays and Wednesdays.  Recheck INR in 4 weeks. Coumadin clinic [phone].

## 2022-08-17 ENCOUNTER — Ambulatory Visit: Payer: No Typology Code available for payment source

## 2022-08-19 ENCOUNTER — Ambulatory Visit: Payer: No Typology Code available for payment source | Attending: Cardiology | Admitting: *Deleted

## 2022-08-19 DIAGNOSIS — Z7901 Long term (current) use of anticoagulants: Secondary | ICD-10-CM

## 2022-08-19 DIAGNOSIS — Z952 Presence of prosthetic heart valve: Secondary | ICD-10-CM | POA: Diagnosis not present

## 2022-08-19 LAB — POCT INR: INR: 2.9 (ref 2.0–3.0)

## 2022-08-19 NOTE — Patient Instructions (Signed)
Description   Continue taking 1 tablet daily except 1/2 tablet on Mondays and Wednesdays.  Recheck INR in 4 weeks. Coumadin clinic [phone].      

## 2022-08-22 ENCOUNTER — Other Ambulatory Visit: Payer: Self-pay | Admitting: Cardiovascular Disease

## 2022-08-22 DIAGNOSIS — Z952 Presence of prosthetic heart valve: Secondary | ICD-10-CM

## 2022-08-23 NOTE — Telephone Encounter (Signed)
Prescription refill request received for warfarin Lov: 05/03/22 [facility])  Next INR check: 09/16/22 Warfarin tablet strength: 7.5mg   Appropriate dose. Refill sent.

## 2022-08-28 ENCOUNTER — Encounter (HOSPITAL_BASED_OUTPATIENT_CLINIC_OR_DEPARTMENT_OTHER): Payer: Self-pay | Admitting: Obstetrics & Gynecology

## 2022-09-16 ENCOUNTER — Ambulatory Visit: Payer: No Typology Code available for payment source | Attending: Cardiovascular Disease

## 2022-09-16 DIAGNOSIS — Z952 Presence of prosthetic heart valve: Secondary | ICD-10-CM | POA: Diagnosis not present

## 2022-09-16 DIAGNOSIS — Z7901 Long term (current) use of anticoagulants: Secondary | ICD-10-CM | POA: Diagnosis not present

## 2022-09-16 LAB — POCT INR: INR: 2.5 (ref 2.0–3.0)

## 2022-09-16 NOTE — Patient Instructions (Signed)
Description   Continue taking 1 tablet daily except 1/2 tablet on Mondays and Wednesdays.  Recheck INR in 5 weeks. Coumadin clinic [phone].

## 2022-09-28 ENCOUNTER — Ambulatory Visit (HOSPITAL_BASED_OUTPATIENT_CLINIC_OR_DEPARTMENT_OTHER)
Admission: RE | Admit: 2022-09-28 | Discharge: 2022-09-28 | Disposition: A | Discharge status: Home / Self Care | Payer: No Typology Code available for payment source | Source: Ambulatory Visit | Attending: Obstetrics & Gynecology | Admitting: Obstetrics & Gynecology

## 2022-09-28 DIAGNOSIS — M816 Localized osteoporosis [Lequesne]: Secondary | ICD-10-CM

## 2022-10-14 ENCOUNTER — Encounter (HOSPITAL_BASED_OUTPATIENT_CLINIC_OR_DEPARTMENT_OTHER): Payer: Self-pay | Admitting: Obstetrics & Gynecology

## 2022-10-14 ENCOUNTER — Telehealth (HOSPITAL_BASED_OUTPATIENT_CLINIC_OR_DEPARTMENT_OTHER): Payer: No Typology Code available for payment source | Admitting: Obstetrics & Gynecology

## 2022-10-14 DIAGNOSIS — M8080XD Other osteoporosis with current pathological fracture, unspecified site, subsequent encounter for fracture with routine healing: Secondary | ICD-10-CM | POA: Diagnosis not present

## 2022-10-14 DIAGNOSIS — S22080D Wedge compression fracture of T11-T12 vertebra, subsequent encounter for fracture with routine healing: Secondary | ICD-10-CM

## 2022-10-14 DIAGNOSIS — S22000D Wedge compression fracture of unspecified thoracic vertebra, subsequent encounter for fracture with routine healing: Secondary | ICD-10-CM | POA: Insufficient documentation
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
## 2022-10-21 ENCOUNTER — Ambulatory Visit: Payer: No Typology Code available for payment source | Attending: Cardiovascular Disease | Admitting: *Deleted

## 2022-10-21 DIAGNOSIS — Z7901 Long term (current) use of anticoagulants: Secondary | ICD-10-CM

## 2022-10-21 DIAGNOSIS — Z952 Presence of prosthetic heart valve: Secondary | ICD-10-CM | POA: Diagnosis not present

## 2022-10-21 LAB — POCT INR: INR: 2.7 (ref 2.0–3.0)

## 2022-10-21 NOTE — Patient Instructions (Addendum)
Description   Continue taking warfarin 1 tablet daily except 1/2 tablet on Mondays and Wednesdays.  Recheck INR in 4 weeks-going out of town. [facility] [phone].

## 2022-11-17 ENCOUNTER — Ambulatory Visit: Payer: No Typology Code available for payment source | Attending: Cardiovascular Disease | Admitting: *Deleted

## 2022-11-17 DIAGNOSIS — Z7901 Long term (current) use of anticoagulants: Secondary | ICD-10-CM | POA: Diagnosis not present

## 2022-11-17 DIAGNOSIS — Z952 Presence of prosthetic heart valve: Secondary | ICD-10-CM

## 2022-11-17 LAB — POCT INR: INR: 2.4 (ref 2.0–3.0)

## 2022-11-17 NOTE — Patient Instructions (Addendum)
Description   Today take 1 tablet of warfarin then continue taking warfarin 1 tablet daily except 1/2 tablet on Mondays and Wednesdays.  Recheck INR in 4 weeks. [facility] [phone].

## 2022-12-07 ENCOUNTER — Other Ambulatory Visit: Payer: Self-pay | Admitting: Family Medicine

## 2022-12-15 ENCOUNTER — Ambulatory Visit: Payer: No Typology Code available for payment source | Attending: Cardiovascular Disease | Admitting: *Deleted

## 2022-12-15 DIAGNOSIS — Z7901 Long term (current) use of anticoagulants: Secondary | ICD-10-CM | POA: Diagnosis not present

## 2022-12-15 DIAGNOSIS — Z952 Presence of prosthetic heart valve: Secondary | ICD-10-CM | POA: Diagnosis not present

## 2022-12-15 LAB — POCT INR: INR: 2.8 (ref 2.0–3.0)

## 2022-12-15 NOTE — Patient Instructions (Signed)
Description   Continue taking warfarin 1 tablet daily except 1/2 tablet on Mondays and Wednesdays.  Recheck INR in 5 weeks. [facility] [phone].

## 2023-01-12 ENCOUNTER — Encounter (HOSPITAL_BASED_OUTPATIENT_CLINIC_OR_DEPARTMENT_OTHER): Payer: Self-pay | Admitting: Obstetrics & Gynecology

## 2023-01-19 ENCOUNTER — Ambulatory Visit: Payer: No Typology Code available for payment source | Attending: Cardiovascular Disease | Admitting: *Deleted

## 2023-01-19 DIAGNOSIS — Z952 Presence of prosthetic heart valve: Secondary | ICD-10-CM

## 2023-01-19 DIAGNOSIS — Z7901 Long term (current) use of anticoagulants: Secondary | ICD-10-CM | POA: Diagnosis not present

## 2023-01-19 LAB — POCT INR: INR: 3.6 — AB (ref 2.0–3.0)

## 2023-01-19 NOTE — Patient Instructions (Signed)
Description   Tomorrow take 1/2 tablet of warfarin then continue taking warfarin 1 tablet daily except 1/2 tablet on Mondays and Wednesdays.  Recheck INR in 5 weeks. [facility] [phone].

## 2023-02-07 ENCOUNTER — Other Ambulatory Visit (HOSPITAL_BASED_OUTPATIENT_CLINIC_OR_DEPARTMENT_OTHER): Payer: No Typology Code available for payment source

## 2023-02-07 ENCOUNTER — Encounter (HOSPITAL_BASED_OUTPATIENT_CLINIC_OR_DEPARTMENT_OTHER): Payer: Self-pay

## 2023-02-07 ENCOUNTER — Other Ambulatory Visit (HOSPITAL_BASED_OUTPATIENT_CLINIC_OR_DEPARTMENT_OTHER): Payer: Self-pay | Admitting: *Deleted

## 2023-02-07 DIAGNOSIS — M8080XD Other osteoporosis with current pathological fracture, unspecified site, subsequent encounter for fracture with routine healing: Secondary | ICD-10-CM

## 2023-02-07 MED ORDER — DENOSUMAB 60 MG/ML ~~LOC~~ SOSY: 60.0000 mg | PREFILLED_SYRINGE | Freq: Once | SUBCUTANEOUS | Status: DC

## 2023-02-08 LAB — COMPREHENSIVE METABOLIC PANEL
ALT: 17 IU/L (ref 0–32)
AST: 21 IU/L (ref 0–40)
Albumin: 4.5 g/dL (ref 3.8–4.9)
Alkaline Phosphatase: 90 IU/L (ref 44–121)
BUN/Creatinine Ratio: 13 (ref 12–28)
BUN: 11 mg/dL (ref 8–27)
Bilirubin Total: 0.9 mg/dL (ref 0.0–1.2)
CO2: 24 mmol/L (ref 20–29)
Calcium: 9.6 mg/dL (ref 8.7–10.3)
Chloride: 103 mmol/L (ref 96–106)
Creatinine, Ser: 0.84 mg/dL (ref 0.57–1.00)
Globulin, Total: 2.7 g/dL (ref 1.5–4.5)
Glucose: 100 mg/dL — ABNORMAL HIGH (ref 70–99)
Potassium: 4.1 mmol/L (ref 3.5–5.2)
Sodium: 141 mmol/L (ref 134–144)
Total Protein: 7.2 g/dL (ref 6.0–8.5)
eGFR: 80 mL/min/{1.73_m2} (ref >59)

## 2023-02-11 ENCOUNTER — Ambulatory Visit (HOSPITAL_BASED_OUTPATIENT_CLINIC_OR_DEPARTMENT_OTHER): Payer: No Typology Code available for payment source

## 2023-02-14 ENCOUNTER — Encounter (HOSPITAL_BASED_OUTPATIENT_CLINIC_OR_DEPARTMENT_OTHER): Payer: Self-pay

## 2023-02-14 ENCOUNTER — Ambulatory Visit (INDEPENDENT_AMBULATORY_CARE_PROVIDER_SITE_OTHER): Payer: No Typology Code available for payment source

## 2023-02-14 VITALS — BP 121/45 | HR 68 | Ht 59.0 in | Wt 156.8 lb

## 2023-02-14 DIAGNOSIS — M8080XD Other osteoporosis with current pathological fracture, unspecified site, subsequent encounter for fracture with routine healing: Secondary | ICD-10-CM

## 2023-02-14 MED ORDER — DENOSUMAB 60 MG/ML ~~LOC~~ SOSY
60.0000 mg | PREFILLED_SYRINGE | Freq: Once | SUBCUTANEOUS | Status: AC
  Administered 2023-02-14: 60 mg via SUBCUTANEOUS

## 2023-02-14 NOTE — Progress Notes (Signed)
Patient came in today to receive Prolia injection. Injection was given in the back of the upper left arm. Patient tolerated injection well. tbw

## 2023-02-23 ENCOUNTER — Ambulatory Visit: Payer: No Typology Code available for payment source | Attending: Cardiovascular Disease

## 2023-02-23 DIAGNOSIS — Z952 Presence of prosthetic heart valve: Secondary | ICD-10-CM | POA: Diagnosis not present

## 2023-02-23 DIAGNOSIS — Z7901 Long term (current) use of anticoagulants: Secondary | ICD-10-CM | POA: Diagnosis not present

## 2023-02-23 LAB — POCT INR: INR: 5 — AB (ref 2.0–3.0)

## 2023-02-23 NOTE — Patient Instructions (Signed)
Description   HOLD today's dose and tomorrow' dose and then continue taking warfarin 1 tablet daily except 1/2 tablet on Mondays and Wednesdays.  Recheck INR in 10 days [facility] [phone].

## 2023-03-01 ENCOUNTER — Ambulatory Visit (INDEPENDENT_AMBULATORY_CARE_PROVIDER_SITE_OTHER): Payer: No Typology Code available for payment source | Admitting: Family Medicine

## 2023-03-01 ENCOUNTER — Encounter: Payer: Self-pay | Admitting: Family Medicine

## 2023-03-01 VITALS — BP 118/80 | HR 72 | Temp 98.1°F | Resp 18 | Ht 59.0 in | Wt 153.0 lb

## 2023-03-01 DIAGNOSIS — Z0000 Encounter for general adult medical examination without abnormal findings: Secondary | ICD-10-CM | POA: Diagnosis not present

## 2023-03-01 DIAGNOSIS — E538 Deficiency of other specified B group vitamins: Secondary | ICD-10-CM

## 2023-03-01 DIAGNOSIS — R569 Unspecified convulsions: Secondary | ICD-10-CM | POA: Diagnosis not present

## 2023-03-01 DIAGNOSIS — Z952 Presence of prosthetic heart valve: Secondary | ICD-10-CM | POA: Diagnosis not present

## 2023-03-01 DIAGNOSIS — M8080XA Other osteoporosis with current pathological fracture, unspecified site, initial encounter for fracture: Secondary | ICD-10-CM

## 2023-03-01 DIAGNOSIS — E559 Vitamin D deficiency, unspecified: Secondary | ICD-10-CM | POA: Diagnosis not present

## 2023-03-01 DIAGNOSIS — Z23 Encounter for immunization: Secondary | ICD-10-CM | POA: Insufficient documentation
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
## 2023-03-01 NOTE — Assessment & Plan Note (Signed)
Due depo provera / osteoporosis

## 2023-03-01 NOTE — Assessment & Plan Note (Signed)
Check labs Refill vita d

## 2023-03-01 NOTE — Assessment & Plan Note (Signed)
Check labs

## 2023-03-01 NOTE — Assessment & Plan Note (Signed)
Ghm utd Check labs  See AVS Health Maintenance  Topic Date Due   Zoster Vaccines- Shingrix (1 of 2) Never done   COVID-19 Vaccine (4 - 2023-24 season) 01/16/2023   MAMMOGRAM  03/12/2023   Cervical Cancer Screening (HPV/Pap Cotest)  02/09/2026   Colonoscopy  12/13/2027   DTaP/Tdap/Td (3 - Td or Tdap) 01/27/2032   INFLUENZA VACCINE  Completed   Hepatitis C Screening  Completed   HIV Screening  Completed   HPV VACCINES  Aged Out

## 2023-03-01 NOTE — Assessment & Plan Note (Signed)
On prolia.

## 2023-03-01 NOTE — Assessment & Plan Note (Signed)
On keppra  Per neuro

## 2023-03-02 LAB — LIPID PANEL
Cholesterol: 226 mg/dL — ABNORMAL HIGH (ref 0–200)
HDL: 62.4 mg/dL (ref >39.00)
LDL Cholesterol: 128 mg/dL — ABNORMAL HIGH (ref 0–99)
NonHDL: 163.52
Total CHOL/HDL Ratio: 4
Triglycerides: 180 mg/dL — ABNORMAL HIGH (ref 0.0–149.0)
VLDL: 36 mg/dL (ref 0.0–40.0)

## 2023-03-04 ENCOUNTER — Ambulatory Visit: Payer: No Typology Code available for payment source | Attending: Internal Medicine

## 2023-03-04 DIAGNOSIS — Z7901 Long term (current) use of anticoagulants: Secondary | ICD-10-CM | POA: Diagnosis not present

## 2023-03-04 DIAGNOSIS — Z952 Presence of prosthetic heart valve: Secondary | ICD-10-CM | POA: Diagnosis not present

## 2023-03-04 LAB — POCT INR: INR: 2.4 (ref 2.0–3.0)

## 2023-03-04 NOTE — Patient Instructions (Signed)
TAKE 1.5 TABLETS TODAY ONLY THEN  continue taking warfarin 1 tablet daily except 1/2 tablet on Mondays and Wednesdays.  Recheck INR in 3 weeks [facility] [phone].

## 2023-03-18 ENCOUNTER — Encounter: Payer: Self-pay | Admitting: Emergency Medicine

## 2023-03-18 ENCOUNTER — Ambulatory Visit
Admission: EM | Admit: 2023-03-18 | Discharge: 2023-03-18 | Disposition: A | Discharge status: Home / Self Care | Payer: No Typology Code available for payment source | Attending: Family Medicine | Admitting: Family Medicine

## 2023-03-18 DIAGNOSIS — R300 Dysuria: Secondary | ICD-10-CM

## 2023-03-18 DIAGNOSIS — N3001 Acute cystitis with hematuria: Secondary | ICD-10-CM | POA: Diagnosis present

## 2023-03-18 LAB — POCT URINALYSIS DIP (MANUAL ENTRY)
Glucose, UA: NEGATIVE mg/dL
Nitrite, UA: POSITIVE — AB
Protein Ur, POC: >=300 mg/dL — AB
Spec Grav, UA: 1.025 (ref 1.010–1.025)
Urobilinogen, UA: 1 U/dL
pH, UA: 5.5 (ref 5.0–8.0)

## 2023-03-18 MED ORDER — CEFDINIR 300 MG PO CAPS: 300.0000 mg | ORAL_CAPSULE | Freq: Two times a day (BID) | ORAL | 0 refills | Status: AC

## 2023-03-18 NOTE — ED Provider Notes (Signed)
[facility]    CSN: [id number] Arrival date & time: 03/18/23  1856      History   Chief Complaint Chief Complaint  Patient presents with   Urinary Frequency    HPI HAIZLEY CANNELLA is a 62 y.o. female.   HPI 62 year old female presents with dysuria/urinary frequency for 2 days.  PMH significant for long-term current use of anticoagulant, s/p AVR, migraine.  Past Medical History:  Diagnosis Date   Anxiety    Aortic stenosis     by Dr. Donata Clay   Colon polyp    Costochondritis    x2   Depression    Diverticulosis    GERD (gastroesophageal reflux disease)    Glaucoma (increased eye pressure)    HLD (hyperlipidemia)    IBS (irritable bowel syndrome)    Long term current use of anticoagulant    Migraine    OAB (overactive bladder)    Osteoporosis    S/P AVR    2001 for bicuspid AV.....Marland KitchenST JUDE   Shingles    ? diag/rash 11/21/12   Vulvar ulceration    recurrent    Patient Active Problem List   Diagnosis Date Noted   Need for influenza vaccination 03/01/2023   Vitamin D deficiency 03/01/2023   Vitamin B12 deficiency 03/01/2023   Compression fracture of thoracic vertebra with routine healing 10/14/2022   Preventative health care 01/26/2022   Seizures (HCC) 01/26/2022   Near syncope 07/23/2021   OAB (overactive bladder) 02/09/2021   Hx of colonic polyps 05/06/2017   Fatigue 05/06/2017   Internal hemorrhoid 02/19/2015   Rectal bleeding 02/19/2015   Osteoporosis 11/28/2014   Abdominal pain, left lower quadrant 10/07/2014   IBS (irritable bowel syndrome) 10/07/2014   Diverticulitis of colon 03/19/2014   Long term current use of anticoagulant 06/29/2010   H/O aortic valve replacement 03/31/2008    Past Surgical History:  Procedure Laterality Date   AORTIC VALVE REPLACEMENT  05/18/1999   [facility]   APPENDECTOMY     post op abdominal wall hematoma/2 transfusions   BREAST BIOPSY Left 09/15/2006   left breast   CESAREAN SECTION     x 2   FINGER  FRACTURE SURGERY Left    pinky    OB History     Gravida  2   Para  2   Term      Preterm      AB      Living  2      SAB      IAB      Ectopic      Multiple      Live Births               Home Medications    Prior to Admission medications   Medication Sig Start Date End Date Taking? Authorizing Provider  calcitonin, salmon, (MIACALCIN/FORTICAL) 200 UNIT/ACT nasal spray 1 spray every morning. 07/01/22  Yes [provider]  cefdinir (OMNICEF) 300 MG capsule Take 1 capsule (300 mg total) by mouth 2 (two) times daily for 7 days. 03/18/23 03/25/23 Yes Trevor Iha, FNP  HYDROcodone-acetaminophen (NORCO) 7.5-325 MG tablet Take 1 tablet by mouth every 4 (four) hours as needed. 07/01/22  Yes [provider]  levETIRAcetam (KEPPRA) 500 MG tablet Take 500 mg by mouth 2 (two) times daily. 06/28/22  Yes [provider]  Vitamin D, Ergocalciferol, (DRISDOL) 1.25 MG (50000 UNIT) CAPS capsule TAKE 1 CAPSULE (50,000 UNITS TOTAL) BY MOUTH EVERY 7 (SEVEN) DAYS  12/07/22  Yes Seabron Spates R, DO  warfarin (COUMADIN) 7.5 MG tablet TAKE 1/2 TO 1 TABLET BY MOUTH DAILY AS DIRECTED BY ANTICOAGULATION CLINIC. 08/23/22  Yes Wendall Stade, MD    Family History Family History  Problem Relation Age of Onset   Hypertension Mother    Diabetes Mother    Hyperlipidemia Mother    Irritable bowel syndrome Mother    Diabetes Brother    Hyperlipidemia Brother    Hypertension Brother    Heart disease Maternal Grandmother    Stroke Maternal Grandmother    Alzheimer's disease Maternal Grandfather    Diabetes Maternal Grandfather    Diabetes Son    Pancreatic cancer Maternal Aunt    Colon cancer Maternal Great-grandmother    Kidney disease Maternal Uncle     Social History Social History   Tobacco Use   Smoking status: Never   Smokeless tobacco: Never  Vaping Use   Vaping status: Never Used  Substance Use Topics   Alcohol use: Yes    Alcohol/week: 1.0  - 2.0 standard drink of alcohol    Types: 1 - 2 Standard drinks or equivalent per week    Comment: 1-2 per day   Drug use: No     Allergies   Morphine and codeine   Review of Systems Review of Systems  Genitourinary:  Positive for dysuria and frequency.     Physical Exam Triage Vital Signs ED Triage Vitals  Encounter Vitals Group     BP      Systolic BP Percentile      Diastolic BP Percentile      Pulse      Resp      Temp      Temp src      SpO2      Weight      Height      Head Circumference      Peak Flow      Pain Score      Pain Loc      Pain Education      Exclude from Growth Chart    No data found.  Updated Vital Signs BP (!) 165/87 (BP Location: Right Arm)   Pulse 69   Temp 98.1 F (36.7 C) (Oral)   Resp 18   Ht 4' 11.75" (1.518 m)   Wt 152 lb (68.9 kg)   LMP 05/18/2003   SpO2 100%   BMI 29.93 kg/m   Visual Acuity Right Eye Distance:   Left Eye Distance:   Bilateral Distance:    Right Eye Near:   Left Eye Near:    Bilateral Near:     Physical Exam Vitals and nursing note reviewed.  Constitutional:      General: She is not in acute distress.    Appearance: Normal appearance. She is obese. She is not ill-appearing.  HENT:     Head: Normocephalic and atraumatic.     Mouth/Throat:     Mouth: Mucous membranes are moist.     Pharynx: Oropharynx is clear.  Eyes:     Extraocular Movements: Extraocular movements intact.     Conjunctiva/sclera: Conjunctivae normal.     Pupils: Pupils are equal, round, and reactive to light.  Cardiovascular:     Rate and Rhythm: Normal rate and regular rhythm.     Pulses: Normal pulses.     Heart sounds: Normal heart sounds.  Pulmonary:     Effort: Pulmonary effort is normal.  Breath sounds: Normal breath sounds. No wheezing, rhonchi or rales.  Musculoskeletal:        General: Normal range of motion.     Cervical back: Normal range of motion and neck supple.  Skin:    General: Skin is warm and  dry.  Neurological:     General: No focal deficit present.     Mental Status: She is alert and oriented to person, place, and time. Mental status is at baseline.  Psychiatric:        Mood and Affect: Mood normal.        Behavior: Behavior normal.        Thought Content: Thought content normal.      UC Treatments / Results  Labs (all labs ordered are listed, but only abnormal results are displayed) Labs Reviewed  POCT URINALYSIS DIP (MANUAL ENTRY) - Abnormal; Notable for the following components:      Result Value   Color, UA red (*)    Clarity, UA turbid (*)    Bilirubin, UA moderate (*)    Ketones, POC UA small (15) (*)    Blood, UA large (*)    Protein Ur, POC >=300 (*)    Nitrite, UA Positive (*)    Leukocytes, UA Large (3+) (*)    All other components within normal limits  URINE CULTURE    EKG   Radiology No results found.  Procedures Procedures (including critical care time)  Medications Ordered in UC Medications - No data to display  Initial Impression / Assessment and Plan / UC Course  I have reviewed the triage vital signs and the nursing notes.  Pertinent labs & imaging results that were available during my care of the patient were reviewed by me and considered in my medical decision making (see chart for details).     MDM: 1.  Acute cystitis with hematuria-UA revealed above, urine culture ordered, Rx'd Cefdinir 300 mg capsule: Take 1 capsule twice daily x 7 days; 2.  Dysuria-UA revealed above, urine culture ordered, Rx'd Cefdinir 300 mg capsule: Take 1 capsule twice daily x 7 days. Advised patient to take medications as directed with food to completion.  Encouraged to increase daily water intake to 64 ounces per day while taking this medication.  Advised we will follow-up with urine culture results once received.  Advised if symptoms worsen and/or unresolved please follow-up with PCP or here for further evaluation.  Patient discharged home, hemodynamically  stable. Final Clinical Impressions(s) / UC Diagnoses   Final diagnoses:  Acute cystitis with hematuria  Dysuria     Discharge Instructions      Advised patient to take medications as directed with food to completion.  Encouraged to increase daily water intake to 64 ounces per day while taking this medication.  Advised we will follow-up with urine culture results once received.  Advised if symptoms worsen and/or unresolved please follow-up with PCP or here for further evaluation.     ED Prescriptions     Medication Sig Dispense Auth. Provider   cefdinir (OMNICEF) 300 MG capsule Take 1 capsule (300 mg total) by mouth 2 (two) times daily for 7 days. 14 capsule Trevor Iha, FNP      PDMP not reviewed this encounter.   Trevor Iha, FNP 03/18/23 1933

## 2023-03-18 NOTE — ED Triage Notes (Signed)
Patient c/o urinary frequency, urgency, hematuria since this am.  Patient denies any OTC pain meds.

## 2023-03-18 NOTE — Discharge Instructions (Addendum)
Advised patient to take medications as directed with food to completion.  Encouraged to increase daily water intake to 64 ounces per day while taking this medication.  Advised we will follow-up with urine culture results once received.  Advised if symptoms worsen and/or unresolved please follow-up with PCP or here for further evaluation.

## 2023-03-20 LAB — URINE CULTURE: Culture: <10000 — AB

## 2023-03-21 ENCOUNTER — Telehealth: Payer: Self-pay | Admitting: Emergency Medicine

## 2023-03-21 NOTE — Telephone Encounter (Signed)
Patient called regarding lab results.  Per [employer], patient's urine culture was negative for a UTI.  Patient instructed to d/c antibiotics.  Patient states that she is still having blood in her urine.  Instructed patient to follow up with her PCP for further testing.  Explained to patient that her culture did not grow out a specific bacteria for an antibiotic to treat which means there's no infection.  Patient states she will follow up with her PCP for further testing.

## 2023-03-22 ENCOUNTER — Telehealth: Payer: Self-pay | Admitting: Cardiovascular Disease

## 2023-03-22 ENCOUNTER — Ambulatory Visit (INDEPENDENT_AMBULATORY_CARE_PROVIDER_SITE_OTHER): Payer: Self-pay

## 2023-03-22 ENCOUNTER — Ambulatory Visit (HOSPITAL_BASED_OUTPATIENT_CLINIC_OR_DEPARTMENT_OTHER)
Admission: RE | Admit: 2023-03-22 | Discharge: 2023-03-22 | Disposition: A | Discharge status: Home / Self Care | Payer: No Typology Code available for payment source | Source: Ambulatory Visit | Attending: Family Medicine | Admitting: Family Medicine

## 2023-03-22 ENCOUNTER — Ambulatory Visit (INDEPENDENT_AMBULATORY_CARE_PROVIDER_SITE_OTHER): Payer: No Typology Code available for payment source | Admitting: Family Medicine

## 2023-03-22 ENCOUNTER — Encounter: Payer: Self-pay | Admitting: Family Medicine

## 2023-03-22 VITALS — BP 142/90 | HR 68 | Temp 98.7°F | Resp 18 | Ht 59.0 in | Wt 155.8 lb

## 2023-03-22 DIAGNOSIS — Z952 Presence of prosthetic heart valve: Secondary | ICD-10-CM | POA: Diagnosis not present

## 2023-03-22 DIAGNOSIS — N3001 Acute cystitis with hematuria: Secondary | ICD-10-CM | POA: Insufficient documentation

## 2023-03-22 DIAGNOSIS — Z7901 Long term (current) use of anticoagulants: Secondary | ICD-10-CM

## 2023-03-22 DIAGNOSIS — R310 Gross hematuria: Secondary | ICD-10-CM | POA: Insufficient documentation

## 2023-03-22 DIAGNOSIS — K8020 Calculus of gallbladder without cholecystitis without obstruction: Secondary | ICD-10-CM | POA: Insufficient documentation

## 2023-03-22 DIAGNOSIS — Z5181 Encounter for therapeutic drug level monitoring: Secondary | ICD-10-CM | POA: Diagnosis not present

## 2023-03-22 LAB — CBC WITH DIFFERENTIAL/PLATELET
Basophils Absolute: 0 10*3/uL (ref 0.0–0.1)
Basophils Relative: 0.6 % (ref 0.0–3.0)
Eosinophils Absolute: 0.1 10*3/uL (ref 0.0–0.7)
Eosinophils Relative: 2.4 % (ref 0.0–5.0)
HCT: 41.6 % (ref 36.0–46.0)
Hemoglobin: 13.9 g/dL (ref 12.0–15.0)
Lymphocytes Relative: 34.5 % (ref 12.0–46.0)
Lymphs Abs: 1.7 10*3/uL (ref 0.7–4.0)
MCHC: 33.5 g/dL (ref 30.0–36.0)
MCV: 92.6 fL (ref 78.0–100.0)
Monocytes Absolute: 0.4 10*3/uL (ref 0.1–1.0)
Monocytes Relative: 7 % (ref 3.0–12.0)
Neutro Abs: 2.8 10*3/uL (ref 1.4–7.7)
Neutrophils Relative %: 55.5 % (ref 43.0–77.0)
Platelets: 190 10*3/uL (ref 150.0–400.0)
RBC: 4.49 Mil/uL (ref 3.87–5.11)
RDW: 12.9 % (ref 11.5–15.5)
WBC: 5.1 10*3/uL (ref 4.0–10.5)

## 2023-03-22 LAB — COMPREHENSIVE METABOLIC PANEL
ALT: 17 U/L (ref 0–35)
AST: 23 U/L (ref 0–37)
Albumin: 4.6 g/dL (ref 3.5–5.2)
Alkaline Phosphatase: 70 U/L (ref 39–117)
BUN: 9 mg/dL (ref 6–23)
CO2: 31 meq/L (ref 19–32)
Calcium: 9.4 mg/dL (ref 8.4–10.5)
Chloride: 102 meq/L (ref 96–112)
Creatinine, Ser: 0.78 mg/dL (ref 0.40–1.20)
GFR: 82.19 mL/min (ref >60.00)
Glucose, Bld: 95 mg/dL (ref 70–99)
Potassium: 4.3 meq/L (ref 3.5–5.1)
Sodium: 139 meq/L (ref 135–145)
Total Bilirubin: 1.3 mg/dL — ABNORMAL HIGH (ref 0.2–1.2)
Total Protein: 7.6 g/dL (ref 6.0–8.3)

## 2023-03-22 LAB — POCT INR: INR: 3.6 — AB (ref 2.0–3.0)

## 2023-03-22 LAB — POC URINALSYSI DIPSTICK (AUTOMATED)
Bilirubin, UA: NEGATIVE
Glucose, UA: NEGATIVE
Ketones, UA: NEGATIVE
Leukocytes, UA: NEGATIVE
Nitrite, UA: NEGATIVE
Protein, UA: POSITIVE — AB
Spec Grav, UA: <=1.005 — AB (ref 1.010–1.025)
Urobilinogen, UA: 0.2 U/dL
pH, UA: 7.5 (ref 5.0–8.0)

## 2023-03-22 MED ORDER — CEFTRIAXONE SODIUM 1 G IJ SOLR: 1.0000 g | Freq: Once | INTRAMUSCULAR | Status: DC

## 2023-03-22 MED ORDER — CEFTRIAXONE SODIUM 1 G IJ SOLR
1.0000 g | Freq: Once | INTRAMUSCULAR | Status: AC
  Administered 2023-03-22: 1 g via INTRAMUSCULAR

## 2023-03-22 NOTE — Patient Instructions (Signed)
Description   Eat greens today and continue taking warfarin 1 tablet daily except 1/2 tablet on Mondays and Wednesdays.  Recheck INR in 2 weeks Coumadin Clinic [phone].

## 2023-03-22 NOTE — Patient Instructions (Signed)

## 2023-03-22 NOTE — Assessment & Plan Note (Signed)
Inr 3.6 Pt was on abx She has appointment to re check this week Cont current dose

## 2023-03-22 NOTE — Progress Notes (Signed)
Established Patient Office Visit  Subjective   Patient ID: Vanessa Owens, female    DOB: 1961-05-04  Age: 62 y.o. MRN: [id number]  Chief Complaint  Patient presents with   Hematuria    Pt states going to UC on Friday night. Pt states culture was negative per UC.     HPI Discussed the use of AI scribe software for clinical note transcription with the patient, who gave verbal consent to proceed.  History of Present Illness   The patient, with a history of urinary tract infection (UTI) and gallstones, presents with ongoing hematuria. The patient noticed the blood in her urine early on a Friday morning and sought care at an urgent care center. Despite the initial dipstick test indicating a UTI, the subsequent culture was negative. The patient was prescribed antibiotics, which she stopped taking after being told to do so by the urgent care center. The patient reports no pain associated with the hematuria.  The patient also has a history of gallstones, for which she has not yet followed up with a surgeon. She reports no recent pain or episodes related to the gallstones since her ER visit. The patient is also on warfarin for anticoagulation therapy, with a slightly elevated INR of 3.6. She has been on this therapy for over 20 years.      Patient Active Problem List   Diagnosis Date Noted   Gross hematuria 03/22/2023   Calculus of gallbladder without cholecystitis without obstruction 03/22/2023   Acute cystitis with hematuria 03/22/2023   Need for influenza vaccination 03/01/2023   Vitamin D deficiency 03/01/2023   Vitamin B12 deficiency 03/01/2023   Compression fracture of thoracic vertebra with routine healing 10/14/2022   Preventative health care 01/26/2022   Seizures (HCC) 01/26/2022   Near syncope 07/23/2021   OAB (overactive bladder) 02/09/2021   Hx of colonic polyps 05/06/2017   Fatigue 05/06/2017   Internal hemorrhoid 02/19/2015   Rectal bleeding 02/19/2015   Osteoporosis  11/28/2014   Abdominal pain, left lower quadrant 10/07/2014   IBS (irritable bowel syndrome) 10/07/2014   Diverticulitis of colon 03/19/2014   Long term current use of anticoagulant 06/29/2010   H/O aortic valve replacement 03/31/2008   Past Medical History:  Diagnosis Date   Anxiety    Aortic stenosis     by Dr. Donata Clay   Colon polyp    Costochondritis    x2   Depression    Diverticulosis    GERD (gastroesophageal reflux disease)    Glaucoma (increased eye pressure)    HLD (hyperlipidemia)    IBS (irritable bowel syndrome)    Long term current use of anticoagulant    Migraine    OAB (overactive bladder)    Osteoporosis    S/P AVR    2001 for bicuspid AV.....Marland KitchenST JUDE   Shingles    ? diag/rash 11/21/12   Vulvar ulceration    recurrent   Past Surgical History:  Procedure Laterality Date   AORTIC VALVE REPLACEMENT  05/18/1999   St Jude   APPENDECTOMY     post op abdominal wall hematoma/2 transfusions   BREAST BIOPSY Left 09/15/2006   left breast   CESAREAN SECTION     x 2   FINGER FRACTURE SURGERY Left    pinky   Social History   Tobacco Use   Smoking status: Never   Smokeless tobacco: Never  Vaping Use   Vaping status: Never Used  Substance Use Topics   Alcohol use: Yes  Alcohol/week: 1.0 - 2.0 standard drink of alcohol    Types: 1 - 2 Standard drinks or equivalent per week    Comment: 1-2 per day   Drug use: No   Social History   Socioeconomic History   Marital status: Single    Spouse name: Not on file   Number of children: 2   Years of education: Not on file   Highest education level: Not on file  Occupational History   Occupation: benefits specialist    Comment: [employer]  Tobacco Use   Smoking status: Never   Smokeless tobacco: Never  Vaping Use   Vaping status: Never Used  Substance and Sexual Activity   Alcohol use: Yes    Alcohol/week: 1.0 - 2.0 standard drink of alcohol    Types: 1 - 2 Standard drinks or equivalent per week     Comment: 1-2 per day   Drug use: No   Sexual activity: Yes    Partners: Male    Birth control/protection: Post-menopausal  Other Topics Concern   Not on file  Social History Narrative   Exercise--  walks on weekend , hikes    Social Determinants of Health   Financial Resource Strain: Not on file  Food Insecurity: Not on file  Transportation Needs: Not on file  Physical Activity: Not on file  Stress: Not on file  Social Connections: Unknown (09/25/2021)   Received from [facility]   Social Network    Social Network: Not on file  Intimate Partner Violence: Not At Risk (02/16/2023)   Received from [facility]   HITS    Over the last 12 months how often did your partner physically hurt you?: 1    Over the last 12 months how often did your partner insult you or talk down to you?: 1    Over the last 12 months how often did your partner threaten you with physical harm?: 1    Over the last 12 months how often did your partner scream or curse at you?: 1   Family Status  Relation Name Status   Mother  Alive   Father  Other       unknown   Brother  Alive   Brother  Alive   Brother  Alive   MGM  Deceased   MGF  Deceased   PGM  Deceased   PGF  Deceased   Son  Alive   Son  Alive   Mat Aunt  Alive   Maternal GGM  Alive   Mat Uncle  Alive  No partnership data on file   Family History  Problem Relation Age of Onset   Hypertension Mother    Diabetes Mother    Hyperlipidemia Mother    Irritable bowel syndrome Mother    Diabetes Brother    Hyperlipidemia Brother    Hypertension Brother    Heart disease Maternal Grandmother    Stroke Maternal Grandmother    Alzheimer's disease Maternal Grandfather    Diabetes Maternal Grandfather    Diabetes Son    Pancreatic cancer Maternal Aunt    Colon cancer Maternal Great-grandmother    Kidney disease Maternal Uncle    Allergies  Allergen Reactions   Morphine And Codeine Nausea Only      Review of Systems   Constitutional:  Negative for fever and malaise/fatigue.  HENT:  Negative for congestion.   Eyes:  Negative for blurred vision.  Respiratory:  Negative for shortness of breath.   Cardiovascular:  Negative for chest pain, palpitations and leg swelling.  Gastrointestinal:  Positive for abdominal pain and nausea. Negative for blood in stool and vomiting.  Genitourinary:  Positive for flank pain and hematuria. Negative for dysuria and frequency.  Musculoskeletal:  Negative for falls.  Skin:  Negative for rash.  Neurological:  Negative for dizziness, loss of consciousness and headaches.  Endo/Heme/Allergies:  Negative for environmental allergies.  Psychiatric/Behavioral:  Negative for depression. The patient is not nervous/anxious.       Objective:     BP (!) 142/90 (BP Location: Left Arm, Patient Position: Sitting, Cuff Size: Normal)   Pulse 68   Temp 98.7 F (37.1 C) (Oral)   Resp 18   Ht 4\' 11"  (1.499 m)   Wt 155 lb 12.8 oz (70.7 kg)   LMP 05/18/2003   SpO2 98%   BMI 31.47 kg/m  BP Readings from Last 3 Encounters:  03/22/23 (!) 142/90  03/18/23 (!) 165/87  03/01/23 118/80   Wt Readings from Last 3 Encounters:  03/22/23 155 lb 12.8 oz (70.7 kg)  03/18/23 152 lb (68.9 kg)  03/01/23 153 lb (69.4 kg)   SpO2 Readings from Last 3 Encounters:  03/22/23 98%  03/18/23 100%  03/01/23 97%      Physical Exam Vitals and nursing note reviewed.  Constitutional:      General: She is not in acute distress.    Appearance: Normal appearance. She is well-developed.  HENT:     Head: Normocephalic and atraumatic.  Eyes:     General: No scleral icterus.       Right eye: No discharge.        Left eye: No discharge.  Cardiovascular:     Rate and Rhythm: Normal rate and regular rhythm.     Heart sounds: No murmur heard. Pulmonary:     Effort: Pulmonary effort is normal. No respiratory distress.     Breath sounds: Normal breath sounds.  Abdominal:     Palpations: Abdomen is soft.      Tenderness: There is abdominal tenderness in the right upper quadrant and suprapubic area. There is no guarding or rebound.  Musculoskeletal:        General: Normal range of motion.     Cervical back: Normal range of motion and neck supple.     Right lower leg: No edema.     Left lower leg: No edema.  Skin:    General: Skin is warm and dry.  Neurological:     Mental Status: She is alert and oriented to person, place, and time.  Psychiatric:        Mood and Affect: Mood normal.        Behavior: Behavior normal.        Thought Content: Thought content normal.        Judgment: Judgment normal.     Results for orders placed or performed in visit on 03/22/23  POCT Urinalysis Dipstick (Automated)  Result Value Ref Range   Color, UA red    Clarity, UA clear    Glucose, UA Negative Negative   Bilirubin, UA negative    Ketones, UA negative    Spec Grav, UA <=1.005 (A) 1.010 - 1.025   Blood, UA large    pH, UA 7.5 5.0 - 8.0   Protein, UA Positive (A) Negative   Urobilinogen, UA 0.2 0.2 or 1.0 E.U./dL   Nitrite, UA negative    Leukocytes, UA Negative Negative  POCT INR  Result Value Ref Range  INR 3.6 (A) 2.0 - 3.0   POC INR      Last CBC Lab Results  Component Value Date   WBC 4.7 01/26/2022   HGB 13.5 01/26/2022   HCT 39.8 01/26/2022   MCV 92.5 01/26/2022   MCH 31.2 04/03/2019   RDW 13.5 01/26/2022   PLT 177.0 01/26/2022   Last metabolic panel Lab Results  Component Value Date   GLUCOSE 100 (H) 02/07/2023   NA 141 02/07/2023   K 4.1 02/07/2023   CL 103 02/07/2023   CO2 24 02/07/2023   BUN 11 02/07/2023   CREATININE 0.84 02/07/2023   EGFR 80 02/07/2023   CALCIUM 9.6 02/07/2023   PROT 7.2 02/07/2023   ALBUMIN 4.5 02/07/2023   LABGLOB 2.7 02/07/2023   AGRATIO 1.9 02/09/2021   BILITOT 0.9 02/07/2023   ALKPHOS 90 02/07/2023   AST 21 02/07/2023   ALT 17 02/07/2023   Last lipids Lab Results  Component Value Date   CHOL 226 (H) 03/01/2023   HDL 62.40  03/01/2023   LDLCALC 128 (H) 03/01/2023   TRIG 180.0 (H) 03/01/2023   CHOLHDL 4 03/01/2023   Last hemoglobin A1c Lab Results  Component Value Date   HGBA1C 5.2 06/28/2013   Last thyroid functions Lab Results  Component Value Date   TSH 3.31 01/26/2022   Last vitamin D Lab Results  Component Value Date   VD25OH 75.64 01/26/2022   Last vitamin B12 and Folate Lab Results  Component Value Date   VITAMINB12 >1500 (H) 01/26/2022      The 10-year ASCVD risk score (Arnett DK, et al., 2019) is: 4.5%    Assessment & Plan:   Problem List Items Addressed This Visit       Unprioritized   Long term current use of anticoagulant   Relevant Orders   POCT INR (Completed)   Gross hematuria - Primary    Rocephin given Culture pending  Urine was very dark  Stat ct ordered       Relevant Orders   CT RENAL STONE STUDY   Ambulatory referral to Urology   POCT Urinalysis Dipstick (Automated) (Completed)   Urine Culture   CBC with Differential/Platelet   Comprehensive metabolic panel   Calculus of gallbladder without cholecystitis without obstruction    Pt seen in Uc  US done Surgical referral placed       Relevant Orders   Ambulatory referral to General Surgery   Acute cystitis with hematuria   Relevant Orders   CBC with Differential/Platelet   Comprehensive metabolic panel  Assessment and Plan    Hematuria Persistent hematuria despite partial treatment for suspected UTI. No associated pain. Dipstick still shows blood and protein, but less than previous. -Complete current course of antibiotics. -Administer antibiotic shot today. -Order CT scan to investigate potential kidney stones. -Refer to urologist in [place].  Warfarin Therapy INR slightly elevated at 3.6, potentially due to recent antibiotic use. -Advise patient to inform Coumadin clinic about recent antibiotic use for potential dose adjustment. -Check INR today.  Gallstones No recent pain since ER  visit. Patient has reduced intake of dairy products. -Advise patient to follow up with surgeon for potential gallbladder surgery. -If pain returns, advise patient to go to nearest ER, preferably at [facility] if surgeon is from [place].  General Health Maintenance -Continue Prolia and calcium supplementation for bone health. -Advise patient to maintain hydration to prevent stone formation. -Order blood work today.        Return if symptoms worsen or  fail to improve.    Donato Schultz, DO

## 2023-03-22 NOTE — Telephone Encounter (Signed)
If Home Health RN is calling please get Coumadin Nurse on the phone STAT  1.  Are you calling in regards to an appointment? no  2.  Are you calling for a refill ? no  3.  Are you having bleeding issues? no  4.  Do you need clearance to hold Coumadin? No  Patient calling to speak to someone about her INR since starting an antibiotic for a UTI. She says her INR is a little elevated today at 3.6.   Please route to the Coumadin Clinic Pool

## 2023-03-22 NOTE — Telephone Encounter (Signed)
Returned pt's call. Confirmed pt had INR drawn today, 3.6 and pt is currently on Omnicef 300 mg  by mouth 2 (two) times daily for 7 days. Made pt aware this abx does not interact with Warfarin. Pt understands she should call coumadin clinic of she starts any other medications. Please refer to anticoagulation encounter for  Warfarin dosing. Pt coming to clinic to recheck INR in 2 weeks, appt scheduled. Pt verbalized understanding.

## 2023-03-22 NOTE — Assessment & Plan Note (Signed)
Pt seen in Uc  US done Surgical referral placed

## 2023-03-22 NOTE — Assessment & Plan Note (Signed)
Rocephin given Culture pending  Urine was very dark  Stat ct ordered

## 2023-03-23 LAB — URINE CULTURE
MICRO NUMBER:: 15688161
Result:: NO GROWTH
SPECIMEN QUALITY:: ADEQUATE

## 2023-03-24 ENCOUNTER — Encounter: Payer: Self-pay | Admitting: Family Medicine

## 2023-03-25 NOTE — Telephone Encounter (Signed)
Pt would like a review of the CMP and CBC please

## 2023-03-28 ENCOUNTER — Encounter: Payer: Self-pay | Admitting: Cardiovascular Disease

## 2023-04-06 ENCOUNTER — Ambulatory Visit: Payer: No Typology Code available for payment source | Attending: Cardiovascular Disease | Admitting: *Deleted

## 2023-04-06 DIAGNOSIS — Z952 Presence of prosthetic heart valve: Secondary | ICD-10-CM | POA: Diagnosis not present

## 2023-04-06 DIAGNOSIS — Z7901 Long term (current) use of anticoagulants: Secondary | ICD-10-CM | POA: Diagnosis not present

## 2023-04-06 LAB — POCT INR: INR: 3.2 — AB (ref 2.0–3.0)

## 2023-04-06 NOTE — Patient Instructions (Signed)
Description   Continue taking warfarin 1 tablet daily except 1/2 tablet on Mondays and Wednesdays.  Recheck INR in 4 weeks. Coumadin Clinic [phone].

## 2023-05-03 ENCOUNTER — Ambulatory Visit: Payer: No Typology Code available for payment source | Attending: Cardiovascular Disease

## 2023-05-03 DIAGNOSIS — Z7901 Long term (current) use of anticoagulants: Secondary | ICD-10-CM | POA: Diagnosis not present

## 2023-05-03 DIAGNOSIS — Z952 Presence of prosthetic heart valve: Secondary | ICD-10-CM | POA: Diagnosis not present

## 2023-05-03 LAB — POCT INR: INR: 4.1 — AB (ref 2.0–3.0)

## 2023-05-03 NOTE — Patient Instructions (Signed)
HOLD TONIGHT ONLY THEN Continue taking warfarin 1 tablet daily except 1/2 tablet on Mondays and Wednesdays.  Recheck INR in 4 weeks. Coumadin Clinic [phone].

## 2023-05-06 ENCOUNTER — Other Ambulatory Visit: Payer: Self-pay | Admitting: Obstetrics & Gynecology

## 2023-05-06 DIAGNOSIS — Z1231 Encounter for screening mammogram for malignant neoplasm of breast: Secondary | ICD-10-CM

## 2023-05-10 ENCOUNTER — Encounter: Payer: Self-pay | Admitting: Cardiovascular Disease

## 2023-05-24 ENCOUNTER — Other Ambulatory Visit: Payer: Self-pay | Admitting: Cardiovascular Disease

## 2023-05-24 DIAGNOSIS — Z952 Presence of prosthetic heart valve: Secondary | ICD-10-CM

## 2023-05-24 NOTE — Progress Notes (Signed)
 CARDIOLOGY OFFICE NOTE  Date:  06/01/2023    Vanessa Owens Date of Birth: 01/07/1961 Medical Record #[id number]  PCP:  Vanessa Hemming, DO  Cardiologist:  Vanessa Owens   No chief complaint on file.    History of Present Illness: Vanessa Owens is a 63 y.o. female remote mechanical AVR ( 19 mm HP St Jude surgeon PVT  in 2001 for bicuspid AV. She has been on chronic coumadin . Echos done at [facility] due to cost. Prior normal GXT in 2018 for atypical chest pain.   TTE reviewed from 03/18/20 EF 60-65% mean gradient 26 mmHg up from 21 mmHg in 2019 peak gradient 40 mmHg DVI 0.42 and AVA 1.3 cm2 mild AR TTE 03/27/21  mean gradient 11 peak 19 trivial AR   Her INR;s have been Rx with no bleeding issues   Working virtually for [employer] also working some at [employer].  No history of GI bleeding but has had polyps removed most recently by Vanessa Owens on 12/12/20  She is doing intermittent fasting with good weight loss. Still working at [employer] part time and has 3 grand kids now   Cardiac CTA 08/05/21 reviewed Calcium  score 4 only 69 th percentile for age/sex CAD RADS 2 no obstructive dx normal AVR OA/CA's   She is not on Keppra  Has had episode in February and April 2023  ? Seizures with change in MS ? Also has  Neurologist ordering tilt table Not orthostatic in office today    She has gallstones and recent hematuria with negative renal stone CT 03/22/23 Been Rx with antibiotics but UA negative   No seizures since on Keppra  Seeing neurology at [facility] again     Past Medical History:  Diagnosis Date   Anxiety    Aortic stenosis     by Vanessa. Matt Song   Colon polyp    Costochondritis    x2   Depression    Diverticulosis    GERD (gastroesophageal reflux disease)    Glaucoma (increased eye pressure)    HLD (hyperlipidemia)    IBS (irritable bowel syndrome)    Long term current use of anticoagulant    Migraine    OAB (overactive bladder)    Osteoporosis     S/P AVR    2001 for bicuspid AV.....Aaron AasST JUDE   Shingles    ? diag/rash 11/21/12   Vulvar ulceration    recurrent    Past Surgical History:  Procedure Laterality Date   AORTIC VALVE REPLACEMENT  05/18/1999   St Jude   APPENDECTOMY     post op abdominal wall hematoma/2 transfusions   BREAST BIOPSY Left 09/15/2006   left breast   CESAREAN SECTION     x 2   FINGER FRACTURE SURGERY Left    pinky     Medications: Current Meds  Medication Sig   cyanocobalamin (VITAMIN B12) 500 MCG tablet Take by mouth.   denosumab  (PROLIA ) 60 MG/ML SOSY injection    levETIRAcetam  (KEPPRA ) 500 MG tablet Take 500 mg by mouth 2 (two) times daily.   Midazolam (NAYZILAM) 5 MG/0.1ML SOLN USE 1 SPRAY IN 1 NOSTRIL AS NEEDED FOR SEIZURE LASTING LONGER THAN 5 MINUTES OR FOR SEIZURE CLUSTER.   Vitamin D , Ergocalciferol , (DRISDOL ) 1.25 MG (50000 UNIT) CAPS capsule TAKE 1 CAPSULE (50,000 UNITS TOTAL) BY MOUTH EVERY 7 (SEVEN) DAYS   warfarin (COUMADIN ) 7.5 MG tablet TAKE 1/2 TO 1 TABLET BY MOUTH DAILY AS DIRECTED BY ANTICOAGULATION CLINIC.  Current Facility-Administered Medications for the 06/01/23 encounter (Office Visit) with Puja Caffey C, MD  Medication   denosumab  (PROLIA ) injection 60 mg     Allergies: Allergies  Allergen Reactions   Morphine And Codeine Nausea Only    Social History: The patient  reports that she has never smoked. She has never used smokeless tobacco. She reports current alcohol use of about 1.0 - 2.0 standard drink of alcohol per week. She reports that she does not use drugs.   Family History: The patient's family history includes Alzheimer's disease in her maternal grandfather; Colon cancer in her maternal great-grandmother; Diabetes in her brother, maternal grandfather, mother, and son; Heart disease in her maternal grandmother; Hyperlipidemia in her brother and mother; Hypertension in her brother and mother; Irritable bowel syndrome in her mother; Kidney disease in her maternal  uncle; Pancreatic cancer in her maternal aunt; Stroke in her maternal grandmother.   Review of Systems: Please see the history of present illness.   All other systems are reviewed and negative.   Physical Exam: VS:  BP (!) 144/86 (BP Location: Right Arm, Patient Position: Sitting, Cuff Size: Normal)   Pulse 74   Ht 5\' 1"  (1.549 m)   Wt 158 lb (71.7 kg)   LMP 05/18/2003   SpO2 95%   BMI 29.85 kg/m  .  BMI Body mass index is 29.85 kg/m.  Wt Readings from Last 3 Encounters:  06/01/23 158 lb (71.7 kg)  03/22/23 155 lb 12.8 oz (70.7 kg)  03/18/23 152 lb (68.9 kg)    Affect appropriate Healthy:  appears stated age HEENT: normal Neck supple with no adenopathy JVP normal no bruits no thyromegaly Lungs clear with no wheezing and good diaphragmatic motion Heart:  S1/S2 click SEM  no AR murmur, no rub, gallop or click PMI normal Abdomen: benighn, BS positve, no tenderness, no AAA no bruit.  No HSM or HJR Distal pulses intact with no bruits No edema Neuro non-focal Skin warm and dry No muscular weakness    LABORATORY DATA:  EKG:  EKG is ordered today. 06/01/2023 SR rate 64 normal   Lab Results  Component Value Date   WBC 5.1 03/22/2023   HGB 13.9 03/22/2023   HCT 41.6 03/22/2023   PLT 190.0 03/22/2023   GLUCOSE 95 03/22/2023   CHOL 226 (H) 03/01/2023   TRIG 180.0 (H) 03/01/2023   HDL 62.40 03/01/2023   LDLCALC 128 (H) 03/01/2023   ALT 17 03/22/2023   AST 23 03/22/2023   NA 139 03/22/2023   K 4.3 03/22/2023   CL 102 03/22/2023   CREATININE 0.78 03/22/2023   BUN 9 03/22/2023   CO2 31 03/22/2023   TSH 3.31 01/26/2022   INR 4.1 (A) 05/03/2023   HGBA1C 5.2 06/28/2013   Lab Results  Component Value Date   INR 4.1 (A) 05/03/2023   INR 3.2 (A) 04/06/2023   INR 3.6 (A) 03/22/2023   PROTIME 21.8 10/17/2008   PROTIME 18.0 10/10/2008     BNP (last 3 results) No results for input(s): "BNP" in the last 8760 hours.  ProBNP (last 3 results) No results for input(s):  "PROBNP" in the last 8760 hours.   Other Studies Reviewed Today:  Echo Study Conclusions 10/2017  - Left ventricle: The cavity size was normal. Systolic function was    normal. The estimated ejection fraction was in the range of 55%    to 60%. Wall motion was normal; there were no regional wall    motion abnormalities. Features are  consistent with a pseudonormal    left ventricular filling pattern, with concomitant abnormal    relaxation and increased filling pressure (grade 2 diastolic    dysfunction).  - Aortic valve: A mechanical prosthesis was present and functioning    normally. Mean gradient (S): 21 mm Hg. Peak gradient (S): 38 mm    Hg. Valve area (VTI): 1.35 cm^2. Valve area (Vmax): 1.18 cm^2.    Valve area (Vmean): 1.24 cm^2.  - Mitral valve: There was mild regurgitation directed centrally.  - Left atrium: The atrium was mildly dilated.   Impressions:   - Mildly increased prosthetic valve gradients and mild dilation of    the ascending aorta are unchanged from the previous study.   Assessment/Plan:  AVR :  old valve surprisingly gradients now in normal range compared to prior normal valve clicks on exam and normal EF improved Cardiac CTA 08/05/21 no obstructive CAD and normal valve function   2. Anticoagulation - no problems noted. Has held for 3 days prior to surgery without incident in past   3.  GI:  f/u Dannis history of colon polyps most recently 12/12/20 polypectomy Benign pathology   4. Seizures:  off Keppra  getting 2nd neurology consult opinion   5. Hematuria:  CT negative no infection resolved   6. Preoperative:  Ok to have general surgery hold coumadin  3 days before no need for bridge with AVR and NSR   Current medicines are reviewed with the patient today.  The patient does not have concerns regarding medicines other than what has been noted above.  The following changes have been made:  See above.  Labs/ tests ordered today include: INR   Orders Placed  This Encounter  Procedures   EKG 12-Lead    Disposition:   FU in a year   Signed: Janelle Mediate, MD  06/01/2023 9:39 AM  [facility] [address] [place], [place]  [address] Phone: [phone] Fax: [phone]

## 2023-05-26 ENCOUNTER — Telehealth: Payer: Self-pay | Admitting: *Deleted

## 2023-05-26 DIAGNOSIS — Z952 Presence of prosthetic heart valve: Secondary | ICD-10-CM

## 2023-05-26 NOTE — Telephone Encounter (Signed)
 Called pt since she has an appt scheduled on 06/01/23 at 830am and our has a staff meeting at that time and we need to have her come to appt at 845am on the same day or reschedule to a time that is better for her. There was no answer so left her a message to call back regarding this.

## 2023-05-26 NOTE — Addendum Note (Signed)
 Addended by: Betsy Coder B on: 05/26/2023 04:40 PM   Modules accepted: Orders

## 2023-05-26 NOTE — Telephone Encounter (Signed)
 Pt returned call and states she does not think 8:45am would give her enough time to get to [facility] for her appt with Dr Eden Emms at 9:15am. Pt requested for lab order to be placed for PT/INR to be drawn at [facility] at [facility]. Lab order placed.

## 2023-05-27 ENCOUNTER — Ambulatory Visit: Payer: No Typology Code available for payment source

## 2023-05-29 ENCOUNTER — Other Ambulatory Visit: Payer: Self-pay | Admitting: Family Medicine

## 2023-06-01 ENCOUNTER — Ambulatory Visit
Payer: No Typology Code available for payment source | Attending: Cardiovascular Disease | Admitting: Cardiovascular Disease

## 2023-06-01 ENCOUNTER — Ambulatory Visit: Payer: No Typology Code available for payment source

## 2023-06-01 VITALS — BP 144/86 | HR 74 | Ht 61.0 in | Wt 158.0 lb

## 2023-06-01 DIAGNOSIS — Z7901 Long term (current) use of anticoagulants: Secondary | ICD-10-CM

## 2023-06-01 DIAGNOSIS — Z952 Presence of prosthetic heart valve: Secondary | ICD-10-CM | POA: Diagnosis not present

## 2023-06-01 NOTE — Patient Instructions (Signed)
 Medication Instructions:  Your physician recommends that you continue on your current medications as directed. Please refer to the Current Medication list given to you today.  *If you need a refill on your cardiac medications before your next appointment, please call your pharmacy*  Lab Work: If you have labs (blood work) drawn today and your tests are completely normal, you will receive your results only by: MyChart Message (if you have MyChart) OR A paper copy in the mail If you have any lab test that is abnormal or we need to change your treatment, we will call you to review the results.  Testing/Procedures: None ordered today.  Follow-Up: At [facility], you and your health needs are our priority.  As part of our continuing mission to provide you with exceptional heart care, we have created designated Provider Care Teams.  These Care Teams include your primary Cardiologist (physician) and Advanced Practice Providers (APPs -  Physician Assistants and Nurse Practitioners) who all work together to provide you with the care you need, when you need it.  We recommend signing up for the patient portal called "MyChart".  Sign up information is provided on this After Visit Summary.  MyChart is used to connect with patients for Virtual Visits (Telemedicine).  Patients are able to view lab/test results, encounter notes, upcoming appointments, etc.  Non-urgent messages can be sent to your provider as well.   To learn more about what you can do with MyChart, go to [email or web address].    Your next appointment:   1 year(s)  Provider:   Charlton Haws, MD     Other Instructions

## 2023-06-02 ENCOUNTER — Ambulatory Visit (INDEPENDENT_AMBULATORY_CARE_PROVIDER_SITE_OTHER): Payer: Self-pay | Admitting: *Deleted

## 2023-06-02 DIAGNOSIS — Z7901 Long term (current) use of anticoagulants: Secondary | ICD-10-CM

## 2023-06-02 DIAGNOSIS — Z952 Presence of prosthetic heart valve: Secondary | ICD-10-CM

## 2023-06-02 LAB — PROTIME-INR
INR: 2.4 — ABNORMAL HIGH (ref 0.9–1.2)
Prothrombin Time: 24.9 s — ABNORMAL HIGH (ref 9.1–12.0)

## 2023-06-02 NOTE — Patient Instructions (Signed)
Description   Spoke with pt and advised to take 1.5 tablets today then continue taking warfarin 1 tablet daily except 1/2 tablet on Mondays and Wednesdays.  Recheck INR in 4 weeks. [facility] [phone].

## 2023-06-03 ENCOUNTER — Ambulatory Visit
Admission: RE | Admit: 2023-06-03 | Discharge: 2023-06-03 | Disposition: A | Discharge status: Home / Self Care | Payer: No Typology Code available for payment source | Source: Ambulatory Visit | Attending: Obstetrics & Gynecology | Admitting: Obstetrics & Gynecology

## 2023-06-03 DIAGNOSIS — Z1231 Encounter for screening mammogram for malignant neoplasm of breast: Secondary | ICD-10-CM

## 2023-06-28 ENCOUNTER — Ambulatory Visit: Payer: No Typology Code available for payment source

## 2023-07-01 ENCOUNTER — Ambulatory Visit: Payer: No Typology Code available for payment source | Attending: Internal Medicine | Admitting: *Deleted

## 2023-07-01 DIAGNOSIS — Z7901 Long term (current) use of anticoagulants: Secondary | ICD-10-CM | POA: Diagnosis not present

## 2023-07-01 DIAGNOSIS — Z952 Presence of prosthetic heart valve: Secondary | ICD-10-CM | POA: Diagnosis not present

## 2023-07-01 LAB — POCT INR: INR: 2.3 (ref 2.0–3.0)

## 2023-07-01 NOTE — Patient Instructions (Signed)
Description   Today take 1.5 tablets of warfarin then continue taking warfarin 1 tablet daily except 1/2 tablet on Mondays and Wednesdays.  Recheck INR in 4 weeks. [facility]in Clinic [phone].

## 2023-07-08 ENCOUNTER — Ambulatory Visit (INDEPENDENT_AMBULATORY_CARE_PROVIDER_SITE_OTHER): Payer: No Typology Code available for payment source | Admitting: Obstetrics & Gynecology

## 2023-07-08 ENCOUNTER — Encounter (HOSPITAL_BASED_OUTPATIENT_CLINIC_OR_DEPARTMENT_OTHER): Payer: Self-pay | Admitting: Obstetrics & Gynecology

## 2023-07-08 VITALS — BP 123/55 | HR 71 | Ht 59.0 in | Wt 153.2 lb

## 2023-07-08 DIAGNOSIS — Z860100 Personal history of colon polyps, unspecified: Secondary | ICD-10-CM

## 2023-07-08 DIAGNOSIS — Z01419 Encounter for gynecological examination (general) (routine) without abnormal findings: Secondary | ICD-10-CM

## 2023-07-08 DIAGNOSIS — L900 Lichen sclerosus et atrophicus: Secondary | ICD-10-CM

## 2023-07-08 DIAGNOSIS — Z952 Presence of prosthetic heart valve: Secondary | ICD-10-CM

## 2023-07-08 DIAGNOSIS — R569 Unspecified convulsions: Secondary | ICD-10-CM

## 2023-07-08 DIAGNOSIS — M8080XD Other osteoporosis with current pathological fracture, unspecified site, subsequent encounter for fracture with routine healing: Secondary | ICD-10-CM

## 2023-07-08 LAB — COMPREHENSIVE METABOLIC PANEL
ALT: 16 [IU]/L (ref 0–32)
AST: 23 [IU]/L (ref 0–40)
Albumin: 4.8 g/dL (ref 3.9–4.9)
Alkaline Phosphatase: 73 [IU]/L (ref 44–121)
BUN/Creatinine Ratio: 15 (ref 12–28)
BUN: 14 mg/dL (ref 8–27)
Bilirubin Total: 1 mg/dL (ref 0.0–1.2)
CO2: 24 mmol/L (ref 20–29)
Calcium: 9.6 mg/dL (ref 8.7–10.3)
Chloride: 102 mmol/L (ref 96–106)
Creatinine, Ser: 0.92 mg/dL (ref 0.57–1.00)
Globulin, Total: 2.5 g/dL (ref 1.5–4.5)
Glucose: 87 mg/dL (ref 70–99)
Potassium: 4.6 mmol/L (ref 3.5–5.2)
Sodium: 141 mmol/L (ref 134–144)
Total Protein: 7.3 g/dL (ref 6.0–8.5)
eGFR: 71 mL/min/{1.73_m2} (ref >59)

## 2023-07-08 MED ORDER — MOMETASONE FUROATE 0.1 % EX OINT: TOPICAL_OINTMENT | Freq: Every evening | CUTANEOUS | 0 refills | Status: DC

## 2023-07-08 NOTE — Patient Instructions (Signed)
 Non hormonal  Coconut oil twice weekly Replens vaginal moisturizer twice weekly Revaree - vaginal hyaluronic acid suppository twice weekly Vit E vaginal suppositories  Hyaluronic acid products -- Lorrin Mais is an [place] product that is a lubricant

## 2023-07-08 NOTE — Progress Notes (Addendum)
ANNUAL EXAM Patient name: CHABLIS LOSH MRN [id number]  Date of birth: Aug 24, 1960 Chief Complaint:   AEX.  No  History of Present Illness:   ZAHLI VETSCH is a 63 y.o. G2P2 Caucasian female being seen today for a routine annual exam.  Has been seizure from for about a year.  Is on Keppra long-term.  Has done full evaluation and does not have a cause for this.  Is able to drive now.  Was very stressful when she couldn't.  She is on prolia.  Followed at [facility].  Had spine MRI recently.  Did have improvement in appearance of bone.  Needs this done next month.  Will updated calcium level.  Patient's last menstrual period was 05/18/2003.   Last pap 02/09/2021. Results were: NILM w/ HRHPV negative. H/O abnormal pap: no Last mammogram: 06/03/2023. Results were: normal. Family h/o breast cancer: no Last colonoscopy: 12/12/2020. Results were: adenomatous polyp with follow up 7 years recommended.  Family h/o colorectal cancer: no     07/08/2023    8:23 AM 03/01/2023    3:24 PM 02/14/2023    8:30 AM 06/11/2022    8:21 AM 01/26/2022    9:07 AM  Depression screen PHQ 2/9  Decreased Interest 0 0 0 0 0  Down, Depressed, Hopeless 0 0 0 0 0  PHQ - 2 Score 0 0 0 0 0     Review of Systems:   Pertinent items are noted in HPI  Denies any headaches, blurred vision, fatigue, shortness of breath, chest pain, abdominal pain, abnormal vaginal discharge/itching/odor/irritation, problems with periods, bowel movements, urination, or intercourse unless otherwise stated above.  Pertinent History Reviewed:  Reviewed past medical,surgical, social and family history.   Reviewed problem list, medications and allergies. Physical Assessment:   Vitals:   07/08/23 0822  BP: (!) 123/55  Pulse: 71  Weight: 153 lb 3.2 oz (69.5 kg)  Height: 4\' 11"  (1.499 m)  Body mass index is 30.94 kg/m.        Physical Examination:   General appearance - well appearing, and in no distress  Mental status -  alert, oriented to person, place, and time  Psych:  She has a normal mood and affect  Skin - warm and dry, normal color, no suspicious lesions noted  Chest - effort normal, all lung fields clear to auscultation bilaterally  Heart - normal rate and regular rhythm, 2/6 murmur most obvious over aortic region  Neck:  midline trachea, no thyromegaly or nodules  Breasts - breasts appear normal, no suspicious masses, no skin or nipple changes or  axillary nodes  Abdomen - soft, nontender, nondistended, no masses or organomegaly  Pelvic - VULVA: hypopigmentation of clitoral region and superior inner labia majora, no excoriations, no tenderness VAGINA: normal appearing vagina with normal color and discharge, no lesions   CERVIX: normal appearing cervix without discharge or lesions, no CMT  Thin prep pap is not indicated today.  UTERUS: uterus is felt to be normal size, shape, consistency and nontender   ADNEXA: No adnexal masses or tenderness noted.  Rectal - normal rectal, good sphincter tone, no masses felt.  Extremities:  No swelling or varicosities noted  Chaperone present for exam, Ina Homes, CMA.  Assessment & Plan:  1. Well woman exam with routine gynecological exam (Primary) - Pap smear 01/2021 with neg HR HPV.  Not indicated today. - Mammogram 05/2023 - Colonoscopy 2022.  Follow up 7 years. - Bone mineral density 2024.  Will be  due next year. - lab work done with PCP, Dr. Zola Button - vaccines reviewed/updated  2. Other osteoporosis with current pathological fracture with routine healing, subsequent encounter - Comprehensive metabolic panel - Pt receiving prolia and will need injection in march  3. H/O aortic valve replacement - on warfarin.  Followed by Dr. Eden Emms  4. Seizures (HCC) - on Keppra.  Followed by neurologist at [facility], Dionisio Paschal.    5. Hx of colonic polyps  6. Lichen sclerosus - findings c/w lichen sclerosus.  Diagnosis discussed with pt.  Will treat with  topical steroid and recheck 1 month. - mometasone (ELOCON) 0.1 % ointment; Apply topically at bedtime.  Dispense: 45 g; Refill: 0    Orders Placed This Encounter  Procedures   Comprehensive metabolic panel    Meds: No orders of the defined types were placed in this encounter.   Follow-up: Return in about 1 year (around 07/07/2024).  Jerene Bears, MD 07/08/2023 8:45 AM

## 2023-07-28 ENCOUNTER — Other Ambulatory Visit (HOSPITAL_BASED_OUTPATIENT_CLINIC_OR_DEPARTMENT_OTHER): Payer: Self-pay | Admitting: *Deleted

## 2023-07-28 DIAGNOSIS — M8080XD Other osteoporosis with current pathological fracture, unspecified site, subsequent encounter for fracture with routine healing: Secondary | ICD-10-CM

## 2023-07-28 MED ORDER — DENOSUMAB 60 MG/ML ~~LOC~~ SOSY
60.0000 mg | PREFILLED_SYRINGE | SUBCUTANEOUS | Status: AC
  Administered 2023-08-16: 60 mg via SUBCUTANEOUS

## 2023-07-29 ENCOUNTER — Ambulatory Visit: Payer: No Typology Code available for payment source | Attending: Cardiovascular Disease | Admitting: *Deleted

## 2023-07-29 DIAGNOSIS — Z7901 Long term (current) use of anticoagulants: Secondary | ICD-10-CM

## 2023-07-29 DIAGNOSIS — Z952 Presence of prosthetic heart valve: Secondary | ICD-10-CM

## 2023-07-29 LAB — POCT INR: INR: 2.9 (ref 2.0–3.0)

## 2023-07-29 NOTE — Patient Instructions (Signed)
Description   Continue taking warfarin 1 tablet daily except 1/2 tablet on Mondays and Wednesdays.  Recheck INR in 5 weeks. [facility] [phone].

## 2023-08-08 ENCOUNTER — Ambulatory Visit (HOSPITAL_BASED_OUTPATIENT_CLINIC_OR_DEPARTMENT_OTHER): Payer: Self-pay | Admitting: Obstetrics & Gynecology

## 2023-08-08 ENCOUNTER — Ambulatory Visit (HOSPITAL_BASED_OUTPATIENT_CLINIC_OR_DEPARTMENT_OTHER): Payer: No Typology Code available for payment source

## 2023-08-15 ENCOUNTER — Ambulatory Visit (HOSPITAL_BASED_OUTPATIENT_CLINIC_OR_DEPARTMENT_OTHER): Payer: No Typology Code available for payment source | Admitting: Obstetrics & Gynecology

## 2023-08-15 ENCOUNTER — Encounter (HOSPITAL_BASED_OUTPATIENT_CLINIC_OR_DEPARTMENT_OTHER): Payer: Self-pay | Admitting: Obstetrics & Gynecology

## 2023-08-15 ENCOUNTER — Ambulatory Visit (HOSPITAL_BASED_OUTPATIENT_CLINIC_OR_DEPARTMENT_OTHER): Admitting: Obstetrics & Gynecology

## 2023-08-16 ENCOUNTER — Encounter (HOSPITAL_BASED_OUTPATIENT_CLINIC_OR_DEPARTMENT_OTHER): Payer: Self-pay | Admitting: Obstetrics & Gynecology

## 2023-08-16 ENCOUNTER — Ambulatory Visit (HOSPITAL_BASED_OUTPATIENT_CLINIC_OR_DEPARTMENT_OTHER): Admitting: Obstetrics & Gynecology

## 2023-08-16 VITALS — BP 130/63 | HR 66 | Ht 59.0 in | Wt 144.6 lb

## 2023-08-16 DIAGNOSIS — M810 Age-related osteoporosis without current pathological fracture: Secondary | ICD-10-CM | POA: Diagnosis not present

## 2023-08-16 DIAGNOSIS — L900 Lichen sclerosus et atrophicus: Secondary | ICD-10-CM | POA: Diagnosis not present

## 2023-08-16 DIAGNOSIS — M8080XD Other osteoporosis with current pathological fracture, unspecified site, subsequent encounter for fracture with routine healing: Secondary | ICD-10-CM

## 2023-08-16 MED ORDER — MOMETASONE FUROATE 0.1 % EX OINT: TOPICAL_OINTMENT | CUTANEOUS | 3 refills | Status: AC

## 2023-08-16 NOTE — Progress Notes (Signed)
 GYNECOLOGY  VISIT  CC:   vulvar recheck  HPI: 63 y.o. G2P2 Single White or Caucasian female here for vulvar recheck after being diagnosed with possible lichen sclerosus due to new physical exam findings.  She has been using mometasone topically at night.  Not really having much itching.  She looked up lichen sclerosus and has some questions.  Addressed these today.  Vulvar cancer risk reviewed and importance of monitoring and management as well as treating any new symptoms.  Reasons for biopsy discussed as well.  Unrelated, pt does need second prolia injection today.  Will continue for one more full year before repeating BMD.  She is taking supplemental Vit D.   Past Medical History:  Diagnosis Date   Anxiety    Aortic stenosis     by Dr. Donata Clay   Colon polyp    Costochondritis    x2   Depression    Diverticulosis    GERD (gastroesophageal reflux disease)    Glaucoma (increased eye pressure)    HLD (hyperlipidemia)    IBS (irritable bowel syndrome)    Long term current use of anticoagulant    Migraine    OAB (overactive bladder)    Osteoporosis    S/P AVR    2001 for bicuspid AV.....Marland KitchenST JUDE   Shingles    ? diag/rash 11/21/12   Vulvar ulceration    recurrent    MEDS:   Current Outpatient Medications on File Prior to Visit  Medication Sig Dispense Refill   cyanocobalamin (VITAMIN B12) 500 MCG tablet Take by mouth.     denosumab (PROLIA) 60 MG/ML SOSY injection      levETIRAcetam (KEPPRA) 500 MG tablet Take 500 mg by mouth 2 (two) times daily.     Midazolam (NAYZILAM) 5 MG/0.1ML SOLN USE 1 SPRAY IN 1 NOSTRIL AS NEEDED FOR SEIZURE LASTING LONGER THAN 5 MINUTES OR FOR SEIZURE CLUSTER.     mometasone (ELOCON) 0.1 % ointment Apply topically at bedtime. 45 g 0   Vitamin D, Ergocalciferol, (DRISDOL) 1.25 MG (50000 UNIT) CAPS capsule TAKE 1 CAPSULE (50,000 UNITS TOTAL) BY MOUTH EVERY 7 (SEVEN) DAYS 4 capsule 5   warfarin (COUMADIN) 7.5 MG tablet TAKE 1/2 TO 1 TABLET BY MOUTH  DAILY AS DIRECTED BY ANTICOAGULATION CLINIC. 75 tablet 1   Current Facility-Administered Medications on File Prior to Visit  Medication Dose Route Frequency Provider Last Rate Last Admin   denosumab (PROLIA) injection 60 mg  60 mg Subcutaneous Once Jerene Bears, MD       denosumab (PROLIA) injection 60 mg  60 mg Subcutaneous Q6 months Jerene Bears, MD        ALLERGIES: Morphine and codeine  SH:  single, non smoker  Review of Systems  Constitutional: Negative.   Genitourinary: Negative.     PHYSICAL EXAMINATION:    BP 130/63 (BP Location: Right Arm, Patient Position: Sitting)   Pulse 66   Ht 4\' 11"  (1.499 m)   Wt 144 lb 9.6 oz (65.6 kg)   LMP 05/18/2003   BMI 29.21 kg/m     General appearance: alert, cooperative and appears stated age  Lymph:  no inguinal LAD noted Pelvic: External genitalia:  mild hypopigmentation around superior labia minora and clitoris as well as inferiorly around rectum.  Minimal changes noted.  No skin thickening or ulcerations noted.              Urethra:  normal appearing urethra with no masses, tenderness or lesions  Bartholins and Skenes: normal     Assessment/Plan: 1. Lichen sclerosus (Primary) - will change dosing now to just twice weekly.  Rx to pharmacy.  Recheck at [facility] - mometasone (ELOCON) 0.1 % ointment; Apply topically 2 (two) times a week. Apply to specific skin areas discussed in office today.  Dispense: 45 g; Refill: 3  2. Other osteoporosis with current pathological fracture with routine healing, subsequent encounter - prolia injection will be given by nursing staff today.  Pt will need f/u in 6 months.

## 2023-08-29 ENCOUNTER — Ambulatory Visit: Attending: Cardiovascular Disease

## 2023-08-29 DIAGNOSIS — Z952 Presence of prosthetic heart valve: Secondary | ICD-10-CM

## 2023-08-29 DIAGNOSIS — Z7901 Long term (current) use of anticoagulants: Secondary | ICD-10-CM

## 2023-08-29 LAB — POCT INR: INR: 5.2 — AB (ref 2.0–3.0)

## 2023-08-29 NOTE — Patient Instructions (Signed)
 Hold today and tomorrow then Continue taking warfarin 1 tablet daily except 1/2 tablet on Mondays and Wednesdays.  Recheck INR in 2 weeks. Coumadin Clinic [phone].

## 2023-09-13 ENCOUNTER — Ambulatory Visit: Attending: Internal Medicine

## 2023-09-13 DIAGNOSIS — Z7901 Long term (current) use of anticoagulants: Secondary | ICD-10-CM

## 2023-09-13 DIAGNOSIS — Z952 Presence of prosthetic heart valve: Secondary | ICD-10-CM | POA: Diagnosis not present

## 2023-09-13 LAB — POCT INR: INR: 2.4 (ref 2.0–3.0)

## 2023-09-13 NOTE — Patient Instructions (Signed)
 Take 1.5 tablets only then Continue taking warfarin 1 tablet daily except 1/2 tablet on Mondays and Wednesdays.  Recheck INR in 4 weeks. Coumadin  Clinic [phone].

## 2023-10-12 ENCOUNTER — Encounter (HOSPITAL_BASED_OUTPATIENT_CLINIC_OR_DEPARTMENT_OTHER): Payer: Self-pay | Admitting: Obstetrics & Gynecology

## 2023-10-14 ENCOUNTER — Ambulatory Visit

## 2023-10-20 ENCOUNTER — Other Ambulatory Visit: Payer: Self-pay | Admitting: Cardiovascular Disease

## 2023-10-20 DIAGNOSIS — Z952 Presence of prosthetic heart valve: Secondary | ICD-10-CM

## 2023-10-20 NOTE — Telephone Encounter (Signed)
 Prescription refill request received for warfarin Lov: Vanessa Owens 06/01/2023 Next INR check: 5/30 scheduled to come in on 6/9  Warfarin tablet strength: 7.5 mg   Refill sent.

## 2023-10-24 ENCOUNTER — Ambulatory Visit: Attending: Cardiovascular Disease

## 2023-10-24 DIAGNOSIS — Z952 Presence of prosthetic heart valve: Secondary | ICD-10-CM | POA: Diagnosis not present

## 2023-10-24 DIAGNOSIS — Z7901 Long term (current) use of anticoagulants: Secondary | ICD-10-CM

## 2023-10-24 LAB — POCT INR: INR: 2.6 (ref 2.0–3.0)

## 2023-10-24 NOTE — Patient Instructions (Signed)
 Continue taking warfarin 1 tablet daily except 1/2 tablet on Mondays and Wednesdays.  Recheck INR in 6 weeks. Coumadin  Clinic [phone].

## 2023-11-17 ENCOUNTER — Other Ambulatory Visit: Payer: Self-pay | Admitting: Family Medicine

## 2023-11-19 ENCOUNTER — Other Ambulatory Visit: Payer: Self-pay | Admitting: Cardiovascular Disease

## 2023-11-19 DIAGNOSIS — Z952 Presence of prosthetic heart valve: Secondary | ICD-10-CM

## 2023-11-21 NOTE — Telephone Encounter (Signed)
 Warfarin 7.5mg  refill H/O aortic valve replacement  Last INR 10/24/23 Last OV 06/01/23

## 2023-11-22 ENCOUNTER — Ambulatory Visit (INDEPENDENT_AMBULATORY_CARE_PROVIDER_SITE_OTHER): Admitting: Family Medicine

## 2023-11-22 VITALS — BP 112/70 | Ht 59.0 in | Wt 147.6 lb

## 2023-11-22 DIAGNOSIS — E538 Deficiency of other specified B group vitamins: Secondary | ICD-10-CM | POA: Diagnosis not present

## 2023-11-22 DIAGNOSIS — E559 Vitamin D deficiency, unspecified: Secondary | ICD-10-CM | POA: Diagnosis not present

## 2023-11-22 MED ORDER — VITAMIN D (ERGOCALCIFEROL) 1.25 MG (50000 UNIT) PO CAPS: 50000.0000 [IU] | ORAL_CAPSULE | ORAL | 1 refills | Status: DC

## 2023-11-22 NOTE — Patient Instructions (Signed)
Vitamin D Deficiency Vitamin D deficiency is when your body does not have enough vitamin D. Vitamin D is important because: It helps your body use certain minerals. It helps to keep your bones healthy. It lessens irritation and swelling (inflammation). It helps the body's defense system (immune system) work better. Not getting enough vitamin D can make your bones soft. What are the causes? Not eating enough foods that have vitamin D in them. Not getting enough sun. Having diseases that make it hard for your body to take in vitamin D. Having had part of your stomach or part of your small intestine taken out. What increases the risk? Being an older adult. Not spending much time outdoors. Living in a long-term care center. Having dark skin. Taking certain medicines. Being overweight or very overweight (obese). Having long-term (chronic) kidney or liver disease. What are the signs or symptoms? In mild cases, there may be no symptoms. If the condition is very bad, symptoms may include: Bone pain. Muscle pain. Not being able to walk normally. Bones that break easily. Joint pain. How is this treated? Treatment may include taking supplements as told by your doctor. Your doctor will tell you what dose is best for you. This may include taking: Vitamin D. Calcium. Follow these instructions at home: Eating and drinking Eat foods that have vitamin D in them, such as: Dairy products, cereals, or juices that have vitamin D added to them (are fortified). Check the label. Fish, such as salmon or trout. Eggs. The vitamin D is in the yolk. Mushrooms that were treated with UV light. Beef liver. The items listed above may not be a complete list of foods and beverages you can eat and drink. Contact a dietitian for more information. General instructions Take over-the-counter and prescription medicines only as told by your doctor. Take supplements only as told by your doctor. Get sunlight in a  safe way. Do not use a tanning bed. Stay at a healthy weight. Lose weight if you need to. Keep all follow-up visits. How is this prevented? Eating foods that naturally have vitamin D in them. Eating or drinking foods and drinks that have vitamin D added to them, such as cereals, juices, and milk. Taking vitamin D or a multivitamin that has vitamin D in it. Being in the sun. Your body makes vitamin D when your skin gets sunlight. Contact a doctor if: Your symptoms do not go away. You feel like you may vomit (nauseous). You vomit. You poop less often than normal, or you have trouble pooping (constipation). Summary Vitamin D deficiency is when your body does not have enough vitamin D. Vitamin D helps to keep your bones healthy. This condition is often treated by taking a supplement. Your doctor will tell you what dose is best for you. This information is not intended to replace advice given to you by your health care provider. Make sure you discuss any questions you have with your health care provider. Document Revised: 02/06/2021 Document Reviewed: 02/06/2021 Elsevier Patient Education  2024 [employer].

## 2023-11-22 NOTE — Progress Notes (Signed)
 Established Patient Office Visit  Subjective   Patient ID: Vanessa Owens, female    DOB: 1960-11-16  Age: 63 y.o. MRN: [id number]  Chief Complaint  Patient presents with   Follow-up    No concerns     HPI Discussed the use of AI scribe software for clinical note transcription with the patient, who gave verbal consent to proceed.  History of Present Illness Vanessa Owens is a 63 year old female who presents for a follow-up regarding her vitamin D  prescription.  She is addressing her vitamin D  prescription, which she normally takes on Tuesdays. The prescription was filled, necessitating her visit. She also takes an over-the-counter supplement in addition to the prescribed vitamin D .  She is currently taking vitamin B12 and is considering whether to check her levels today or wait until her annual check-up in October.  She recently went tubing on the [facility] with a group of friends, which she found enjoyable and relaxing. The experience was pleasant, with good weather and no rain, and the river was calm and wide[place] more [place]ids.   Patient Active Problem List   Diagnosis Date Noted   Lichen sclerosus 07/08/2023   Gross hematuria 03/22/2023   Calculus of gallbladder without cholecystitis without obstruction 03/22/2023   Acute cystitis with hematuria 03/22/2023   Need for influenza vaccination 03/01/2023   Vitamin D  deficiency 03/01/2023   Vitamin B12 deficiency 03/01/2023   Compression fracture of thoracic vertebra with routine healing 10/14/2022   Preventative health care 01/26/2022   Seizures (HCC) 01/26/2022   Near syncope 07/23/2021   OAB (overactive bladder) 02/09/2021   Hx of colonic polyps 05/06/2017   Fatigue 05/06/2017   Internal hemorrhoid 02/19/2015   Rectal bleeding 02/19/2015   Osteoporosis 11/28/2014   Abdominal pain, left lower quadrant 10/07/2014   IBS (irritable bowel syndrome) 10/07/2014   Diverticulitis of  colon 03/19/2014   Long term current use of anticoagulant 06/29/2010   H/O aortic valve replacement 03/31/2008   Past Medical History:  Diagnosis Date   Anxiety    Aortic stenosis     by Dr. Fleeta Ochoa   Colon polyp    Costochondritis    x2   Depression    Diverticulosis    GERD (gastroesophageal reflux disease)    Glaucoma (increased eye pressure)    HLD (hyperlipidemia)    IBS (irritable bowel syndrome)    Long term current use of anticoagulant    Migraine    OAB (overactive bladder)    Osteoporosis    S/P AVR    2001 for bicuspid AV.....SABRAST JUDE   Shingles    ? diag/rash 11/21/12   Vulvar ulceration    recurrent   Past Surgical History:  Procedure Laterality Date   AORTIC VALVE REPLACEMENT  05/18/1999   [facility]   APPENDECTOMY     post op abdominal wall hematoma/2 transfusions   BREAST BIOPSY Left 09/15/2006   left breast   CESAREAN SECTION     x 2   FINGER FRACTURE SURGERY Left    pinky   Social History   Tobacco Use   Smoking status: Never   Smokeless tobacco: Never  Vaping Use   Vaping status: Never Used  Substance Use Topics   Alcohol use: Yes    Alcohol/week: 1.0 - 2.0 standard drink of alcohol    Types: 1 - 2 Standard drinks or equivalent per week    Comment: 1-2 per day  Drug use: No   Social History   Socioeconomic History   Marital status: Single    Spouse name: Not on file   Number of children: 2   Years of education: Not on file   Highest education level: 12th grade  Occupational History   Occupation: benefits specialist    Comment: [employer]  Tobacco Use   Smoking status: Never   Smokeless tobacco: Never  Vaping Use   Vaping status: Never Used  Substance and Sexual Activity   Alcohol use: Yes    Alcohol/week: 1.0 - 2.0 standard drink of alcohol    Types: 1 - 2 Standard drinks or equivalent per week    Comment: 1-2 per day   Drug use: No   Sexual activity: Yes    Partners: Male    Birth control/protection: Post-menopausal  Other  Topics Concern   Not on file  Social History Narrative   Exercise--  walks on weekend , hikes    Social Drivers of Health   Financial Resource Strain: Low Risk  (11/22/2023)   Overall Financial Resource Strain (CARDIA)    Difficulty of Paying Living Expenses: Not very hard  Food Insecurity: No Food Insecurity (11/22/2023)   Hunger Vital Sign    Worried About Running Out of Food in the Last Year: Never true    Ran Out of Food in the Last Year: Never true  Transportation Needs: No Transportation Needs (11/22/2023)   PRAPARE - Administrator, Civil Service (Medical): No    Lack of Transportation (Non-Medical): No  Physical Activity: Insufficiently Active (11/22/2023)   Exercise Vital Sign    Days of Exercise per Week: 1 day    Minutes of Exercise per Session: 10 min  Stress: Stress Concern Present (11/22/2023)   [employer] of Occupational Health - Occupational Stress Questionnaire    Feeling of Stress: To some extent  Social Connections: Moderately Isolated (11/22/2023)   Social Connection and Isolation Panel    Frequency of Communication with Friends and Family: More than three times a week    Frequency of Social Gatherings with Friends and Family: Once a week    Attends Religious Services: 1 to 4 times per year    Active Member of [employer] or Organizations: No    Attends Engineer, structural: Not on file    Marital Status: Divorced  Intimate Partner Violence: Not At Risk (04/04/2023)   Received from [facility]   HITS    Over the last 12 months how often did your partner physically hurt you?: Never    Over the last 12 months how often did your partner insult you or talk down to you?: Never    Over the last 12 months how often did your partner threaten you with physical harm?: Never    Over the last 12 months how often did your partner scream or curse at you?: Never   Family Status  Relation Name Status   Mother  Alive   Father  Other       unknown   Brother   Nature conservation officer   Brother  Alive   MGM  Deceased   MGF  Deceased   PGM  Deceased   PGF  Deceased   Son  Alive   Son  Alive   Mat Aunt  Alive   Maternal GGM  Alive   Mat Uncle  Alive  No partnership data on file   Family History  Problem  Relation Age of Onset   Hypertension Mother    Diabetes Mother    Hyperlipidemia Mother    Irritable bowel syndrome Mother    Diabetes Brother    Hyperlipidemia Brother    Hypertension Brother    Heart disease Maternal Grandmother    Stroke Maternal Grandmother    Alzheimer's disease Maternal Grandfather    Diabetes Maternal Grandfather    Diabetes Son    Pancreatic cancer Maternal Aunt    Colon cancer Maternal Great-grandmother    Kidney disease Maternal Uncle    Allergies  Allergen Reactions   Morphine     REACTION: sick      Review of Systems  Constitutional:  Negative for chills, fever and malaise/fatigue.  HENT:  Negative for congestion and hearing loss.   Eyes:  Negative for blurred vision and discharge.  Respiratory:  Negative for cough, sputum production and shortness of breath.   Cardiovascular:  Negative for chest pain, palpitations and leg swelling.  Gastrointestinal:  Negative for abdominal pain, blood in stool, constipation, diarrhea, heartburn, nausea and vomiting.  Genitourinary:  Negative for dysuria, frequency, hematuria and urgency.  Musculoskeletal:  Negative for back pain, falls and myalgias.  Skin:  Negative for rash.  Neurological:  Negative for dizziness, sensory change, loss of consciousness, weakness and headaches.  Endo/Heme/Allergies:  Negative for environmental allergies. Does not bruise/bleed easily.  Psychiatric/Behavioral:  Negative for depression and suicidal ideas. The patient is not nervous/anxious and does not have insomnia.       Objective:     BP 112/70 (BP Location: Left Arm, Patient Position: Sitting, Cuff Size: Normal)   Ht 4' 11 (1.499 m)   Wt 147 lb 9.6 oz (67 kg)   LMP  05/18/2003   BMI 29.81 kg/m  BP Readings from Last 3 Encounters:  11/22/23 112/70  08/16/23 130/63  07/08/23 (!) 123/55   Wt Readings from Last 3 Encounters:  11/22/23 147 lb 9.6 oz (67 kg)  08/16/23 144 lb 9.6 oz (65.6 kg)  07/08/23 153 lb 3.2 oz (69.5 kg)   SpO2 Readings from Last 3 Encounters:  06/01/23 95%  03/22/23 98%  03/18/23 100%      Physical Exam Vitals and nursing note reviewed.  Constitutional:      General: She is not in acute distress.    Appearance: Normal appearance. She is well-developed.  HENT:     Head: Normocephalic and atraumatic.  Eyes:     General: No scleral icterus.       Right eye: No discharge.        Left eye: No discharge.  Cardiovascular:     Rate and Rhythm: Normal rate and regular rhythm.     Heart sounds: No murmur heard. Pulmonary:     Effort: Pulmonary effort is normal. No respiratory distress.     Breath sounds: Normal breath sounds.  Musculoskeletal:        General: Normal range of motion.     Cervical back: Normal range of motion and neck supple.     Right lower leg: No edema.     Left lower leg: No edema.  Skin:    General: Skin is warm and dry.  Neurological:     Mental Status: She is alert and oriented to person, place, and time.  Psychiatric:        Mood and Affect: Mood normal.        Behavior: Behavior normal.        Thought Content: Thought content normal.  Judgment: Judgment normal.      No results found for any visits on 11/22/23.  Last CBC Lab Results  Component Value Date   WBC 5.1 03/22/2023   HGB 13.9 03/22/2023   HCT 41.6 03/22/2023   MCV 92.6 03/22/2023   MCH 31.2 04/03/2019   RDW 12.9 03/22/2023   PLT 190.0 03/22/2023   Last metabolic panel Lab Results  Component Value Date   GLUCOSE 87 07/08/2023   NA 141 07/08/2023   K 4.6 07/08/2023   CL 102 07/08/2023   CO2 24 07/08/2023   BUN 14 07/08/2023   CREATININE 0.92 07/08/2023   EGFR 71 07/08/2023   CALCIUM  9.6 07/08/2023   PROT  7.3 07/08/2023   ALBUMIN 4.8 07/08/2023   LABGLOB 2.5 07/08/2023   AGRATIO 1.9 02/09/2021   BILITOT 1.0 07/08/2023   ALKPHOS 73 07/08/2023   AST 23 07/08/2023   ALT 16 07/08/2023   Last lipids Lab Results  Component Value Date   CHOL 226 (H) 03/01/2023   HDL 62.40 03/01/2023   LDLCALC 128 (H) 03/01/2023   TRIG 180.0 (H) 03/01/2023   CHOLHDL 4 03/01/2023   Last hemoglobin A1c Lab Results  Component Value Date   HGBA1C 5.2 06/28/2013   Last thyroid  functions Lab Results  Component Value Date   TSH 3.31 01/26/2022   Last vitamin D  Lab Results  Component Value Date   VD25OH 75.64 01/26/2022   Last vitamin B12 and Folate Lab Results  Component Value Date   VITAMINB12 >1500 (H) 01/26/2022      The 10-year ASCVD risk score (Arnett DK, et al., 2019) is: 2.9%    Assessment & Plan:   Problem List Items Addressed This Visit       Unprioritized   Vitamin D  deficiency - Primary   Relevant Medications   Vitamin D , Ergocalciferol , (DRISDOL ) 1.25 MG (50000 UNIT) CAPS capsule   Other Relevant Orders   Vitamin B12   VITAMIN D  25 Hydroxy (Vit-D Deficiency, Fractures)   Vitamin B12 deficiency   Relevant Orders   Vitamin B12   VITAMIN D  25 Hydroxy (Vit-D Deficiency, Fractures)  Assessment and Plan Assessment & Plan Vitamin D  deficiency   She is taking vitamin D  supplements and needs a prescription refill. She also uses over-the-counter vitamin D . No immediate plan to check levels today, but she is aware of the need for monitoring. Refill vitamin D  prescription.  Vitamin B12 deficiency   She is taking vitamin B12 supplements. Monitoring will occur at the annual check-up in October, as B12 monitoring is not considered preventive care.    Return if symptoms worsen or fail to improve, for as scheduled.    Airam Heidecker R Lowne Chase, DO

## 2023-11-23 ENCOUNTER — Telehealth: Payer: Self-pay | Admitting: *Deleted

## 2023-11-23 LAB — VITAMIN B12: Vitamin B-12: >1500 pg/mL — ABNORMAL HIGH (ref 211–911)

## 2023-11-23 LAB — VITAMIN D 25 HYDROXY (VIT D DEFICIENCY, FRACTURES): VITD: 104.82 ng/mL (ref 30.00–100.00)

## 2023-11-23 NOTE — Telephone Encounter (Signed)
 CRITICAL VALUE STICKER  CRITICAL VALUE: Vitamin d   104.82  MESSENGER (representative from lab): [id number]

## 2023-11-24 ENCOUNTER — Ambulatory Visit: Payer: Self-pay | Admitting: Family Medicine

## 2023-11-25 NOTE — Telephone Encounter (Signed)
 Pt made aware

## 2023-12-06 ENCOUNTER — Ambulatory Visit: Attending: Internal Medicine | Admitting: *Deleted

## 2023-12-06 DIAGNOSIS — Z7901 Long term (current) use of anticoagulants: Secondary | ICD-10-CM | POA: Diagnosis not present

## 2023-12-06 DIAGNOSIS — Z952 Presence of prosthetic heart valve: Secondary | ICD-10-CM | POA: Diagnosis not present

## 2023-12-06 LAB — POCT INR: INR: 3.1 — AB (ref 2.0–3.0)

## 2023-12-06 NOTE — Progress Notes (Signed)
 INR-3.1; Please see anticoagulation encounter.

## 2023-12-06 NOTE — Patient Instructions (Signed)
 Description   Continue taking warfarin 1 tablet daily except 1/2 tablet on Mondays and Wednesdays.  Recheck INR in 6 weeks. Coumadin  Clinic [phone].

## 2024-01-19 ENCOUNTER — Ambulatory Visit

## 2024-01-27 ENCOUNTER — Ambulatory Visit: Attending: Cardiovascular Disease | Admitting: *Deleted

## 2024-01-27 DIAGNOSIS — Z7901 Long term (current) use of anticoagulants: Secondary | ICD-10-CM

## 2024-01-27 DIAGNOSIS — Z952 Presence of prosthetic heart valve: Secondary | ICD-10-CM

## 2024-01-27 LAB — POCT INR: INR: 2.6 (ref 2.0–3.0)

## 2024-01-27 NOTE — Patient Instructions (Signed)
 Description   INR-2.6; Today take 1.5 tablets of warfarin then continue taking warfarin 1 tablet daily except 1/2 tablet on Mondays and Wednesdays.  Recheck INR in 6 weeks. Coumadin  Clinic [phone].

## 2024-01-27 NOTE — Progress Notes (Signed)
 Description   INR-2.6; Today take 1.5 tablets of warfarin then continue taking warfarin 1 tablet daily except 1/2 tablet on Mondays and Wednesdays.  Recheck INR in 6 weeks. Coumadin  Clinic [phone].

## 2024-01-30 ENCOUNTER — Other Ambulatory Visit (HOSPITAL_BASED_OUTPATIENT_CLINIC_OR_DEPARTMENT_OTHER): Payer: Self-pay | Admitting: *Deleted

## 2024-01-30 DIAGNOSIS — M810 Age-related osteoporosis without current pathological fracture: Secondary | ICD-10-CM

## 2024-01-30 MED ORDER — DENOSUMAB 60 MG/ML ~~LOC~~ SOSY
60.0000 mg | PREFILLED_SYRINGE | SUBCUTANEOUS | Status: AC
  Administered 2024-02-17: 60 mg via SUBCUTANEOUS

## 2024-02-14 ENCOUNTER — Encounter (HOSPITAL_BASED_OUTPATIENT_CLINIC_OR_DEPARTMENT_OTHER): Payer: Self-pay | Admitting: Obstetrics & Gynecology

## 2024-02-17 ENCOUNTER — Ambulatory Visit (HOSPITAL_BASED_OUTPATIENT_CLINIC_OR_DEPARTMENT_OTHER)

## 2024-02-17 VITALS — BP 131/76 | HR 66

## 2024-02-17 DIAGNOSIS — M810 Age-related osteoporosis without current pathological fracture: Secondary | ICD-10-CM

## 2024-02-17 DIAGNOSIS — Z23 Encounter for immunization: Secondary | ICD-10-CM

## 2024-02-17 NOTE — Progress Notes (Signed)
 NURSE VISIT- INJECTION  SUBJECTIVE:  Vanessa Owens is a 63 y.o. G2P2 female here for a Prolia  Injection for per provider order. She is a GYN patient.   OBJECTIVE:  LMP 05/18/2003   Appears well, in no apparent distress  Injection administered in: Left arm  No orders of the defined types were placed in this encounter.  Patient also received influenza vaccine while here. Injection given in the right deltoid.  ASSESSMENT: GYN patient Prolia  Injection for per provider order PLAN: Follow-up: as scheduled   Morna LOISE Quale, RN

## 2024-03-01 ENCOUNTER — Encounter: Payer: No Typology Code available for payment source | Admitting: Family Medicine

## 2024-03-06 ENCOUNTER — Encounter: Payer: Self-pay | Admitting: Family Medicine

## 2024-03-06 ENCOUNTER — Ambulatory Visit (INDEPENDENT_AMBULATORY_CARE_PROVIDER_SITE_OTHER): Admitting: Family Medicine

## 2024-03-06 VITALS — BP 128/84 | HR 65 | Temp 98.0°F | Resp 16 | Ht 59.0 in | Wt 154.0 lb

## 2024-03-06 DIAGNOSIS — E559 Vitamin D deficiency, unspecified: Secondary | ICD-10-CM | POA: Diagnosis not present

## 2024-03-06 DIAGNOSIS — Z0000 Encounter for general adult medical examination without abnormal findings: Secondary | ICD-10-CM | POA: Diagnosis not present

## 2024-03-06 DIAGNOSIS — Z952 Presence of prosthetic heart valve: Secondary | ICD-10-CM

## 2024-03-06 DIAGNOSIS — Z23 Encounter for immunization: Secondary | ICD-10-CM | POA: Diagnosis not present

## 2024-03-06 DIAGNOSIS — R569 Unspecified convulsions: Secondary | ICD-10-CM | POA: Diagnosis not present

## 2024-03-06 DIAGNOSIS — M8080XA Other osteoporosis with current pathological fracture, unspecified site, initial encounter for fracture: Secondary | ICD-10-CM

## 2024-03-06 LAB — CBC WITH DIFFERENTIAL/PLATELET
Basophils Absolute: 0 K/uL (ref 0.0–0.1)
Basophils Relative: 0.4 % (ref 0.0–3.0)
Eosinophils Absolute: 0.1 K/uL (ref 0.0–0.7)
Eosinophils Relative: 2.2 % (ref 0.0–5.0)
HCT: 39.3 % (ref 36.0–46.0)
Hemoglobin: 13.3 g/dL (ref 12.0–15.0)
Lymphocytes Relative: 34.4 % (ref 12.0–46.0)
Lymphs Abs: 1.5 K/uL (ref 0.7–4.0)
MCHC: 33.9 g/dL (ref 30.0–36.0)
MCV: 93.1 fl (ref 78.0–100.0)
Monocytes Absolute: 0.3 K/uL (ref 0.1–1.0)
Monocytes Relative: 8 % (ref 3.0–12.0)
Neutro Abs: 2.4 K/uL (ref 1.4–7.7)
Neutrophils Relative %: 55 % (ref 43.0–77.0)
Platelets: 163 K/uL (ref 150.0–400.0)
RBC: 4.22 Mil/uL (ref 3.87–5.11)
RDW: 13.5 % (ref 11.5–15.5)
WBC: 4.3 K/uL (ref 4.0–10.5)

## 2024-03-06 LAB — LIPID PANEL
Cholesterol: 236 mg/dL — ABNORMAL HIGH (ref 0–200)
HDL: 66.1 mg/dL (ref >39.00)
LDL Cholesterol: 141 mg/dL — ABNORMAL HIGH (ref 0–99)
NonHDL: 169.87
Total CHOL/HDL Ratio: 4
Triglycerides: 145 mg/dL (ref 0.0–149.0)
VLDL: 29 mg/dL (ref 0.0–40.0)

## 2024-03-06 LAB — COMPREHENSIVE METABOLIC PANEL WITH GFR
ALT: 17 U/L (ref 0–35)
AST: 22 U/L (ref 0–37)
Albumin: 4.6 g/dL (ref 3.5–5.2)
Alkaline Phosphatase: 59 U/L (ref 39–117)
BUN: 10 mg/dL (ref 6–23)
CO2: 24 meq/L (ref 19–32)
Calcium: 9.2 mg/dL (ref 8.4–10.5)
Chloride: 106 meq/L (ref 96–112)
Creatinine, Ser: 0.77 mg/dL (ref 0.40–1.20)
GFR: 82.91 mL/min (ref >60.00)
Glucose, Bld: 102 mg/dL — ABNORMAL HIGH (ref 70–99)
Potassium: 4.4 meq/L (ref 3.5–5.1)
Sodium: 144 meq/L (ref 135–145)
Total Bilirubin: 0.9 mg/dL (ref 0.2–1.2)
Total Protein: 7.1 g/dL (ref 6.0–8.3)

## 2024-03-06 LAB — TSH: TSH: 2.24 u[IU]/mL (ref 0.35–5.50)

## 2024-03-06 LAB — VITAMIN D 25 HYDROXY (VIT D DEFICIENCY, FRACTURES): VITD: 25.72 ng/mL — ABNORMAL LOW (ref 30.00–100.00)

## 2024-03-06 NOTE — Assessment & Plan Note (Signed)
 On prolia  Con't calcium  and vita d Recheck bmd--- 2 years

## 2024-03-06 NOTE — Progress Notes (Signed)
 5  Subjective:    Patient ID: Vanessa Owens, female    DOB: 06-25-1960, 63 y.o.   MRN: [id number]  Chief Complaint  Patient presents with   Annual Exam    Pt states fasting     HPI Patient is in today for cpe.  Discussed the use of AI scribe software for clinical note transcription with the patient, who gave verbal consent to proceed.  History of Present Illness Vanessa Owens is a 63 year old female who presents for an annual physical exam.  She recently completed a ten-mile hike as part of the [place] [employer] [place] event at [facility], despite having spinal fractures due to osteoporosis. She experiences soreness but managed the hike by being 'slow and steady.' She is under orthopedic care and receives Prolia  injections for her osteoporosis, with the most recent injection administered a few weeks ago.  She has a history of seizures that began a few years ago, with no prior history in childhood and no family history of seizures. The seizures were initially diagnosed after an episode where she was found 'aimlessly walking around' her living room following a seizure during a work meeting. She is currently on medication for seizure control and has not experienced any seizures for two years.  Her vitamin D  levels were previously high, but she stayed on the vitamin D  supplements. She continues to take B12 supplements. She recently received her flu vaccine.  Her family history includes a cousin with Down syndrome who developed seizures later in life and subsequently dementia, which runs in the family. There is no change in her family history since her last visit.  No issues with her stomach or joints aside from soreness from the recent hike. She has not had any new surgeries or changes in her medical history since her last visit.    Past Medical History:  Diagnosis Date   Anxiety    Aortic stenosis     by Dr. Fleeta Ochoa   Colon polyp    Costochondritis    x2   Depression     Diverticulosis    GERD (gastroesophageal reflux disease)    Glaucoma (increased eye pressure)    HLD (hyperlipidemia)    IBS (irritable bowel syndrome)    Long term current use of anticoagulant    Migraine    OAB (overactive bladder)    Osteoporosis    S/P AVR    2001 for bicuspid AV.....SABRAST JUDE   Shingles    ? diag/rash 11/21/12   Vulvar ulceration    recurrent    Past Surgical History:  Procedure Laterality Date   AORTIC VALVE REPLACEMENT  05/18/1999   [facility]   APPENDECTOMY     post op abdominal wall hematoma/2 transfusions   BREAST BIOPSY Left 09/15/2006   left breast   CESAREAN SECTION     x 2   FINGER FRACTURE SURGERY Left    pinky    Family History  Problem Relation Age of Onset   Hypertension Mother    Diabetes Mother    Hyperlipidemia Mother    Irritable bowel syndrome Mother    Diabetes Brother    Hyperlipidemia Brother    Hypertension Brother    Heart disease Maternal Grandmother    Stroke Maternal Grandmother    Alzheimer's disease Maternal Grandfather    Diabetes Maternal Grandfather    Diabetes Son    Pancreatic cancer Maternal Aunt    Colon cancer Maternal Great-grandmother    Kidney  disease Maternal Uncle     Social History   Socioeconomic History   Marital status: Single    Spouse name: Not on file   Number of children: 2   Years of education: Not on file   Highest education level: 12th grade  Occupational History   Occupation: benefits specialist    Comment: [employer]  Tobacco Use   Smoking status: Never   Smokeless tobacco: Never  Vaping Use   Vaping status: Never Used  Substance and Sexual Activity   Alcohol use: Yes    Alcohol/week: 1.0 - 2.0 standard drink of alcohol    Types: 1 - 2 Standard drinks or equivalent per week    Comment: 1-2 per day   Drug use: No   Sexual activity: Yes    Partners: Male    Birth control/protection: Post-menopausal  Other Topics Concern   Not on file  Social History Narrative   Exercise--   walks on weekend , hikes    Social Drivers of Health   Financial Resource Strain: Low Risk  (03/05/2024)   Overall Financial Resource Strain (CARDIA)    Difficulty of Paying Living Expenses: Not very hard  Food Insecurity: No Food Insecurity (03/05/2024)   Hunger Vital Sign    Worried About Running Out of Food in the Last Year: Never true    Ran Out of Food in the Last Year: Never true  Transportation Needs: No Transportation Needs (03/05/2024)   PRAPARE - Administrator, Civil Service (Medical): No    Lack of Transportation (Non-Medical): No  Physical Activity: Insufficiently Active (03/05/2024)   Exercise Vital Sign    Days of Exercise per Week: 1 day    Minutes of Exercise per Session: 30 min  Stress: No Stress Concern Present (03/05/2024)   [employer] of Occupational Health - Occupational Stress Questionnaire    Feeling of Stress: Only a little  Social Connections: Moderately Integrated (03/05/2024)   Social Connection and Isolation Panel    Frequency of Communication with Friends and Family: More than three times a week    Frequency of Social Gatherings with Friends and Family: Twice a week    Attends Religious Services: 1 to 4 times per year    Active Member of [employer] or Organizations: No    Attends Engineer, structural: Not on file    Marital Status: Living with partner  Intimate Partner Violence: Not At Risk (04/04/2023)   Received from [facility]   HITS    Over the last 12 months how often did your partner physically hurt you?: Never    Over the last 12 months how often did your partner insult you or talk down to you?: Never    Over the last 12 months how often did your partner threaten you with physical harm?: Never    Over the last 12 months how often did your partner scream or curse at you?: Never    Outpatient Medications Prior to Visit  Medication Sig Dispense Refill   cyanocobalamin  (VITAMIN B12) 500 MCG tablet Take by mouth.      denosumab  (PROLIA ) 60 MG/ML SOSY injection      levETIRAcetam  (KEPPRA ) 500 MG tablet Take 500 mg by mouth 2 (two) times daily.     Midazolam (NAYZILAM) 5 MG/0.1ML SOLN USE 1 SPRAY IN 1 NOSTRIL AS NEEDED FOR SEIZURE LASTING LONGER THAN 5 MINUTES OR FOR SEIZURE CLUSTER.     mometasone  (ELOCON ) 0.1 % ointment Apply topically 2 (two) times  a week. Apply to specific skin areas discussed in office today. 45 g 3   warfarin (COUMADIN ) 7.5 MG tablet TAKE 1/2 TO 1 TABLET BY MOUTH DAILY AS DIRECTED BY ANTICOAGULATION CLINIC. 30 tablet 3   Cholecalciferol (VITAMIN D -3) 25 MCG (1000 UT) CAPS Take by mouth.     Vitamin D , Ergocalciferol , (DRISDOL ) 1.25 MG (50000 UNIT) CAPS capsule Take 1 capsule (50,000 Units total) by mouth every 7 (seven) days. 12 capsule 1   Facility-Administered Medications Prior to Visit  Medication Dose Route Frequency Provider Last Rate Last Admin   denosumab  (PROLIA ) injection 60 mg  60 mg Subcutaneous Once Cleotilde Ronal RAMAN, MD       denosumab  (PROLIA ) injection 60 mg  60 mg Subcutaneous Q6 months Cleotilde Ronal RAMAN, MD   60 mg at 08/16/23 1001    Allergies  Allergen Reactions   Morphine     REACTION: sick    Review of Systems  Constitutional:  Negative for chills, fever and malaise/fatigue.  HENT:  Negative for congestion and hearing loss.   Eyes:  Negative for blurred vision and discharge.  Respiratory:  Negative for cough, sputum production and shortness of breath.   Cardiovascular:  Negative for chest pain, palpitations and leg swelling.  Gastrointestinal:  Negative for abdominal pain, blood in stool, constipation, diarrhea, heartburn, nausea and vomiting.  Genitourinary:  Negative for dysuria, frequency, hematuria and urgency.  Musculoskeletal:  Negative for back pain, falls and myalgias.  Skin:  Negative for rash.  Neurological:  Negative for dizziness, sensory change, loss of consciousness, weakness and headaches.  Endo/Heme/Allergies:  Negative for environmental allergies.  Does not bruise/bleed easily.  Psychiatric/Behavioral:  Negative for depression and suicidal ideas. The patient is not nervous/anxious and does not have insomnia.        Objective:    Physical Exam Vitals and nursing note reviewed.  Constitutional:      General: She is not in acute distress.    Appearance: Normal appearance. She is well-developed.  HENT:     Head: Normocephalic and atraumatic.     Right Ear: Tympanic membrane, ear canal and external ear normal. There is no impacted cerumen.     Left Ear: Tympanic membrane, ear canal and external ear normal. There is no impacted cerumen.     Nose: Nose normal.     Mouth/Throat:     Mouth: Mucous membranes are moist.     Pharynx: Oropharynx is clear. No oropharyngeal exudate or posterior oropharyngeal erythema.  Eyes:     General: No scleral icterus.       Right eye: No discharge.        Left eye: No discharge.     Conjunctiva/sclera: Conjunctivae normal.     Pupils: Pupils are equal, round, and reactive to light.  Neck:     Thyroid : No thyromegaly or thyroid  tenderness.     Vascular: No JVD.  Cardiovascular:     Rate and Rhythm: Normal rate and regular rhythm.     Heart sounds: Normal heart sounds. No murmur heard. Pulmonary:     Effort: Pulmonary effort is normal. No respiratory distress.     Breath sounds: Normal breath sounds.  Abdominal:     General: Bowel sounds are normal. There is no distension.     Palpations: Abdomen is soft. There is no mass.     Tenderness: There is no abdominal tenderness. There is no guarding or rebound.  Genitourinary:    Vagina: Normal.  Musculoskeletal:  General: Normal range of motion.     Cervical back: Normal range of motion and neck supple.     Right lower leg: No edema.     Left lower leg: No edema.  Lymphadenopathy:     Cervical: No cervical adenopathy.  Skin:    General: Skin is warm and dry.     Findings: No erythema or rash.  Neurological:     Mental Status: She is  alert and oriented to person, place, and time.     Cranial Nerves: No cranial nerve deficit.     Deep Tendon Reflexes: Reflexes are normal and symmetric.  Psychiatric:        Mood and Affect: Mood normal.        Behavior: Behavior normal.        Thought Content: Thought content normal.        Judgment: Judgment normal.     BP 128/84 (BP Location: Right Arm, Patient Position: Sitting, Cuff Size: Normal)   Pulse 65   Temp 98 F (36.7 C) (Oral)   Resp 16   Ht 4' 11 (1.499 m)   Wt 154 lb (69.9 kg)   LMP 05/18/2003   SpO2 96%   BMI 31.10 kg/m  Wt Readings from Last 3 Encounters:  03/06/24 154 lb (69.9 kg)  11/22/23 147 lb 9.6 oz (67 kg)  08/16/23 144 lb 9.6 oz (65.6 kg)    Diabetic Foot Exam - Simple   No data filed    Lab Results  Component Value Date   WBC 5.1 03/22/2023   HGB 13.9 03/22/2023   HCT 41.6 03/22/2023   PLT 190.0 03/22/2023   GLUCOSE 87 07/08/2023   CHOL 226 (H) 03/01/2023   TRIG 180.0 (H) 03/01/2023   HDL 62.40 03/01/2023   LDLCALC 128 (H) 03/01/2023   ALT 16 07/08/2023   AST 23 07/08/2023   NA 141 07/08/2023   K 4.6 07/08/2023   CL 102 07/08/2023   CREATININE 0.92 07/08/2023   BUN 14 07/08/2023   CO2 24 07/08/2023   TSH 3.31 01/26/2022   INR 2.6 01/27/2024   HGBA1C 5.2 06/28/2013    Lab Results  Component Value Date   TSH 3.31 01/26/2022   Lab Results  Component Value Date   WBC 5.1 03/22/2023   HGB 13.9 03/22/2023   HCT 41.6 03/22/2023   MCV 92.6 03/22/2023   PLT 190.0 03/22/2023   Lab Results  Component Value Date   NA 141 07/08/2023   K 4.6 07/08/2023   CO2 24 07/08/2023   GLUCOSE 87 07/08/2023   BUN 14 07/08/2023   CREATININE 0.92 07/08/2023   BILITOT 1.0 07/08/2023   ALKPHOS 73 07/08/2023   AST 23 07/08/2023   ALT 16 07/08/2023   PROT 7.3 07/08/2023   ALBUMIN 4.8 07/08/2023   CALCIUM  9.6 07/08/2023   EGFR 71 07/08/2023   GFR 82.19 03/22/2023   Lab Results  Component Value Date   CHOL 226 (H) 03/01/2023   Lab  Results  Component Value Date   HDL 62.40 03/01/2023   Lab Results  Component Value Date   LDLCALC 128 (H) 03/01/2023   Lab Results  Component Value Date   TRIG 180.0 (H) 03/01/2023   Lab Results  Component Value Date   CHOLHDL 4 03/01/2023   Lab Results  Component Value Date   HGBA1C 5.2 06/28/2013       Assessment & Plan:  Preventative health care Assessment & Plan: Ghm utd Check labs  See  AVS Health Maintenance  Topic Date Due   Zoster Vaccines- Shingrix (1 of 2) Never done   COVID-19 Vaccine (5 - 2025-26 season) 03/22/2024 (Originally 01/16/2024)   Mammogram  06/02/2024   Cervical Cancer Screening (HPV/Pap Cotest)  02/09/2026   Colonoscopy  12/13/2027   DTaP/Tdap/Td (3 - Td or Tdap) 01/27/2032   Pneumococcal Vaccine: 50+ Years  Completed   Influenza Vaccine  Completed   Hepatitis C Screening  Completed   HIV Screening  Completed   Hepatitis B Vaccines 19-59 Average Risk  Aged Out   HPV VACCINES  Aged Out   Meningococcal B Vaccine  Aged Out     Orders: -     CBC with Differential/Platelet -     Comprehensive metabolic panel with GFR -     Lipid panel -     TSH -     VITAMIN D  25 Hydroxy (Vit-D Deficiency, Fractures)  Vitamin D  deficiency -     VITAMIN D  25 Hydroxy (Vit-D Deficiency, Fractures)  H/O aortic valve replacement Assessment & Plan: Per cardiology   Seizures (HCC) Assessment & Plan: Per neuro   Other osteoporosis with current pathological fracture, initial encounter Assessment & Plan: On prolia  Con't calcium  and vita d Recheck bmd--- 2 years   Need for pneumococcal 20-valent conjugate vaccination -     Pneumococcal conjugate vaccine 20-valent    Jamee JONELLE Antonio Cyndee, DO

## 2024-03-06 NOTE — Assessment & Plan Note (Signed)
 Ghm utd Check labs  See AVS Health Maintenance  Topic Date Due   Zoster Vaccines- Shingrix (1 of 2) Never done   COVID-19 Vaccine (5 - 2025-26 season) 03/22/2024 (Originally 01/16/2024)   Mammogram  06/02/2024   Cervical Cancer Screening (HPV/Pap Cotest)  02/09/2026   Colonoscopy  12/13/2027   DTaP/Tdap/Td (3 - Td or Tdap) 01/27/2032   Pneumococcal Vaccine: 50+ Years  Completed   Influenza Vaccine  Completed   Hepatitis C Screening  Completed   HIV Screening  Completed   Hepatitis B Vaccines 19-59 Average Risk  Aged Out   HPV VACCINES  Aged Out   Meningococcal B Vaccine  Aged Out

## 2024-03-06 NOTE — Assessment & Plan Note (Signed)
 Per cardiology

## 2024-03-06 NOTE — Assessment & Plan Note (Signed)
 Per neuro

## 2024-03-08 ENCOUNTER — Encounter: Payer: Self-pay | Admitting: Family Medicine

## 2024-03-09 ENCOUNTER — Ambulatory Visit: Attending: Cardiovascular Disease | Admitting: *Deleted

## 2024-03-09 DIAGNOSIS — Z952 Presence of prosthetic heart valve: Secondary | ICD-10-CM | POA: Diagnosis not present

## 2024-03-09 DIAGNOSIS — Z7901 Long term (current) use of anticoagulants: Secondary | ICD-10-CM | POA: Diagnosis not present

## 2024-03-09 LAB — POCT INR: INR: 4.8 — AB (ref 2.0–3.0)

## 2024-03-09 NOTE — Progress Notes (Signed)
 Description   INR-4.8; Do not take any warfarin today and no warfarin tomorrow then continue taking warfarin 1 tablet daily except 1/2 tablet on Mondays and Wednesdays.  Recheck INR in 4 weeks. Coumadin  Clinic [phone].

## 2024-03-09 NOTE — Patient Instructions (Signed)
 Description   INR-4.8; Do not take any warfarin today and no warfarin tomorrow then continue taking warfarin 1 tablet daily except 1/2 tablet on Mondays and Wednesdays.  Recheck INR in 4 weeks. Coumadin  Clinic [phone].

## 2024-03-11 ENCOUNTER — Ambulatory Visit: Payer: Self-pay | Admitting: Family

## 2024-03-22 ENCOUNTER — Other Ambulatory Visit: Payer: Self-pay | Admitting: Cardiovascular Disease

## 2024-03-22 DIAGNOSIS — Z952 Presence of prosthetic heart valve: Secondary | ICD-10-CM

## 2024-04-03 ENCOUNTER — Other Ambulatory Visit: Payer: Self-pay | Admitting: *Deleted

## 2024-04-03 DIAGNOSIS — M810 Age-related osteoporosis without current pathological fracture: Secondary | ICD-10-CM

## 2024-04-03 MED ORDER — DENOSUMAB 60 MG/ML ~~LOC~~ SOSY: 60.0000 mg | PREFILLED_SYRINGE | SUBCUTANEOUS | Status: AC

## 2024-04-03 NOTE — Progress Notes (Signed)
 Nex Prolia  order needed for after 08/17/24. Order placed [facility]

## 2024-04-06 ENCOUNTER — Ambulatory Visit

## 2024-04-11 ENCOUNTER — Ambulatory Visit: Attending: Cardiovascular Disease | Admitting: *Deleted

## 2024-04-11 DIAGNOSIS — Z952 Presence of prosthetic heart valve: Secondary | ICD-10-CM

## 2024-04-11 DIAGNOSIS — Z7901 Long term (current) use of anticoagulants: Secondary | ICD-10-CM | POA: Diagnosis not present

## 2024-04-11 LAB — POCT INR: INR: 2.4 (ref 2.0–3.0)

## 2024-04-11 NOTE — Progress Notes (Signed)
 Description   INR-2.4; Today take 1 tablet of warfarin then continue taking warfarin 1 tablet daily except 1/2 tablet on Mondays and Wednesdays.  Recheck INR in 6 weeks per request. Coumadin  Clinic [phone].

## 2024-04-11 NOTE — Patient Instructions (Addendum)
  Description   INR-2.4; Today take 1 tablet of warfarin then continue taking warfarin 1 tablet daily except 1/2 tablet on Mondays and Wednesdays.  Recheck INR in 6 weeks per request. Coumadin  Clinic [phone].

## 2024-04-27 ENCOUNTER — Encounter: Payer: Self-pay | Admitting: Cardiovascular Disease

## 2024-05-24 ENCOUNTER — Ambulatory Visit: Attending: Cardiovascular Disease | Admitting: Pharmacist

## 2024-05-24 DIAGNOSIS — Z952 Presence of prosthetic heart valve: Secondary | ICD-10-CM | POA: Diagnosis not present

## 2024-05-24 DIAGNOSIS — Z7901 Long term (current) use of anticoagulants: Secondary | ICD-10-CM

## 2024-05-24 LAB — POCT INR: INR: 2.4 (ref 2.0–3.0)

## 2024-05-24 NOTE — Patient Instructions (Addendum)
 Description   INR-2.4; Today take 1.5 tablets and then increase to 1 tablet daily except 1/2 tablet on Wednesdays.  Recheck INR in 2 weeks. Coumadin  Clinic [phone].

## 2024-05-24 NOTE — Progress Notes (Signed)
 Description   INR-2.4; Today take 1.5 tablets and then increase to 1 tablet daily except 1/2 tablet on Wednesdays.  Recheck INR in 2 weeks. Coumadin  Clinic [phone].

## 2024-06-04 ENCOUNTER — Other Ambulatory Visit: Payer: Self-pay | Admitting: Obstetrics & Gynecology

## 2024-06-04 DIAGNOSIS — Z1231 Encounter for screening mammogram for malignant neoplasm of breast: Secondary | ICD-10-CM

## 2024-06-08 ENCOUNTER — Ambulatory Visit: Attending: Cardiovascular Disease

## 2024-06-08 DIAGNOSIS — Z952 Presence of prosthetic heart valve: Secondary | ICD-10-CM | POA: Diagnosis not present

## 2024-06-08 DIAGNOSIS — Z7901 Long term (current) use of anticoagulants: Secondary | ICD-10-CM | POA: Diagnosis not present

## 2024-06-08 LAB — POCT INR: INR: 3.1 — AB (ref 2.0–3.0)

## 2024-06-08 NOTE — Progress Notes (Signed)
 INR 3.1  Continue 1 tablet daily except 1/2 tablet on Wednesdays.  Recheck INR in 4 weeks. Coumadin  Clinic [phone].

## 2024-06-08 NOTE — Patient Instructions (Signed)
 Continue 1 tablet daily except 1/2 tablet on Wednesdays.  Recheck INR in 4 weeks. Coumadin  Clinic [phone].

## 2024-06-12 ENCOUNTER — Ambulatory Visit

## 2024-06-12 DIAGNOSIS — Z1231 Encounter for screening mammogram for malignant neoplasm of breast: Secondary | ICD-10-CM

## 2024-06-19 ENCOUNTER — Ambulatory Visit
Admission: RE | Admit: 2024-06-19 | Discharge: 2024-06-19 | Disposition: A | Source: Ambulatory Visit | Attending: Obstetrics & Gynecology | Admitting: Obstetrics & Gynecology

## 2024-06-19 DIAGNOSIS — Z1231 Encounter for screening mammogram for malignant neoplasm of breast: Secondary | ICD-10-CM

## 2024-07-06 ENCOUNTER — Ambulatory Visit

## 2024-07-13 ENCOUNTER — Ambulatory Visit (HOSPITAL_BASED_OUTPATIENT_CLINIC_OR_DEPARTMENT_OTHER): Payer: No Typology Code available for payment source | Admitting: Obstetrics & Gynecology

## 2024-08-20 ENCOUNTER — Ambulatory Visit: Admitting: Cardiovascular Disease
